# Patient Record
Sex: Female | Born: 1975 | Race: White | Hispanic: No | Marital: Single | Smoking: Former smoker
Health system: Southern US, Community
[De-identification: ages and names within clinical notes are randomized; demographics above are authoritative.]

## PROBLEM LIST (undated history)

## (undated) ENCOUNTER — Inpatient Hospital Stay (HOSPITAL_COMMUNITY): Payer: Self-pay

## (undated) DIAGNOSIS — F329 Major depressive disorder, single episode, unspecified: Secondary | ICD-10-CM

## (undated) DIAGNOSIS — Z8489 Family history of other specified conditions: Secondary | ICD-10-CM

## (undated) DIAGNOSIS — O99213 Obesity complicating pregnancy, third trimester: Secondary | ICD-10-CM

## (undated) DIAGNOSIS — T8859XA Other complications of anesthesia, initial encounter: Secondary | ICD-10-CM

## (undated) DIAGNOSIS — G4700 Insomnia, unspecified: Secondary | ICD-10-CM

## (undated) DIAGNOSIS — O99343 Other mental disorders complicating pregnancy, third trimester: Secondary | ICD-10-CM

## (undated) DIAGNOSIS — J40 Bronchitis, not specified as acute or chronic: Secondary | ICD-10-CM

## (undated) DIAGNOSIS — I10 Essential (primary) hypertension: Secondary | ICD-10-CM

## (undated) DIAGNOSIS — N200 Calculus of kidney: Secondary | ICD-10-CM

## (undated) DIAGNOSIS — O09529 Supervision of elderly multigravida, unspecified trimester: Secondary | ICD-10-CM

## (undated) DIAGNOSIS — O24419 Gestational diabetes mellitus in pregnancy, unspecified control: Secondary | ICD-10-CM

## (undated) DIAGNOSIS — F419 Anxiety disorder, unspecified: Secondary | ICD-10-CM

## (undated) DIAGNOSIS — E669 Obesity, unspecified: Secondary | ICD-10-CM

## (undated) DIAGNOSIS — K5909 Other constipation: Secondary | ICD-10-CM

## (undated) DIAGNOSIS — Z9889 Other specified postprocedural states: Secondary | ICD-10-CM

## (undated) DIAGNOSIS — G43909 Migraine, unspecified, not intractable, without status migrainosus: Secondary | ICD-10-CM

## (undated) DIAGNOSIS — F410 Panic disorder [episodic paroxysmal anxiety] without agoraphobia: Secondary | ICD-10-CM

## (undated) DIAGNOSIS — R350 Frequency of micturition: Secondary | ICD-10-CM

## (undated) DIAGNOSIS — G5603 Carpal tunnel syndrome, bilateral upper limbs: Secondary | ICD-10-CM

## (undated) DIAGNOSIS — F99 Mental disorder, not otherwise specified: Secondary | ICD-10-CM

## (undated) DIAGNOSIS — F32A Depression, unspecified: Secondary | ICD-10-CM

## (undated) DIAGNOSIS — B029 Zoster without complications: Secondary | ICD-10-CM

## (undated) DIAGNOSIS — R112 Nausea with vomiting, unspecified: Secondary | ICD-10-CM

## (undated) DIAGNOSIS — F430 Acute stress reaction: Secondary | ICD-10-CM

## (undated) DIAGNOSIS — R0602 Shortness of breath: Secondary | ICD-10-CM

## (undated) DIAGNOSIS — J302 Other seasonal allergic rhinitis: Secondary | ICD-10-CM

## (undated) DIAGNOSIS — Z98891 History of uterine scar from previous surgery: Secondary | ICD-10-CM

## (undated) DIAGNOSIS — K219 Gastro-esophageal reflux disease without esophagitis: Secondary | ICD-10-CM

## (undated) DIAGNOSIS — C439 Malignant melanoma of skin, unspecified: Secondary | ICD-10-CM

## (undated) DIAGNOSIS — T4145XA Adverse effect of unspecified anesthetic, initial encounter: Secondary | ICD-10-CM

## (undated) DIAGNOSIS — O133 Gestational [pregnancy-induced] hypertension without significant proteinuria, third trimester: Secondary | ICD-10-CM

## (undated) HISTORY — DX: Gestational diabetes mellitus in pregnancy, unspecified control: O24.419

## (undated) HISTORY — DX: Supervision of elderly multigravida, unspecified trimester: O09.529

## (undated) HISTORY — PX: CHOLECYSTECTOMY: SHX55

## (undated) HISTORY — PX: WISDOM TOOTH EXTRACTION: SHX21

---

## 2003-08-06 DIAGNOSIS — G5600 Carpal tunnel syndrome, unspecified upper limb: Secondary | ICD-10-CM | POA: Insufficient documentation

## 2005-01-04 DIAGNOSIS — Z8742 Personal history of other diseases of the female genital tract: Secondary | ICD-10-CM

## 2005-08-05 DIAGNOSIS — F319 Bipolar disorder, unspecified: Secondary | ICD-10-CM | POA: Insufficient documentation

## 2007-08-24 ENCOUNTER — Emergency Department (HOSPITAL_COMMUNITY): Admission: EM | Admit: 2007-08-24 | Discharge: 2007-08-24 | Payer: Self-pay | Admitting: Family Medicine

## 2007-09-17 ENCOUNTER — Emergency Department (HOSPITAL_COMMUNITY): Admission: EM | Admit: 2007-09-17 | Discharge: 2007-09-17 | Payer: Self-pay | Admitting: Family Medicine

## 2007-10-17 ENCOUNTER — Emergency Department (HOSPITAL_COMMUNITY): Admission: EM | Admit: 2007-10-17 | Discharge: 2007-10-17 | Payer: Self-pay | Admitting: Emergency Medicine

## 2007-11-12 ENCOUNTER — Emergency Department (HOSPITAL_COMMUNITY): Admission: EM | Admit: 2007-11-12 | Discharge: 2007-11-12 | Payer: Self-pay | Admitting: Family Medicine

## 2008-02-11 ENCOUNTER — Encounter (INDEPENDENT_AMBULATORY_CARE_PROVIDER_SITE_OTHER): Payer: Self-pay | Admitting: Internal Medicine

## 2008-02-19 ENCOUNTER — Ambulatory Visit: Payer: Self-pay | Admitting: Internal Medicine

## 2008-02-19 DIAGNOSIS — N83209 Unspecified ovarian cyst, unspecified side: Secondary | ICD-10-CM | POA: Insufficient documentation

## 2008-02-19 DIAGNOSIS — G43909 Migraine, unspecified, not intractable, without status migrainosus: Secondary | ICD-10-CM | POA: Insufficient documentation

## 2008-02-19 DIAGNOSIS — K219 Gastro-esophageal reflux disease without esophagitis: Secondary | ICD-10-CM | POA: Insufficient documentation

## 2008-02-19 DIAGNOSIS — F429 Obsessive-compulsive disorder, unspecified: Secondary | ICD-10-CM | POA: Insufficient documentation

## 2008-02-19 DIAGNOSIS — Z8719 Personal history of other diseases of the digestive system: Secondary | ICD-10-CM | POA: Insufficient documentation

## 2008-02-19 DIAGNOSIS — K8020 Calculus of gallbladder without cholecystitis without obstruction: Secondary | ICD-10-CM | POA: Insufficient documentation

## 2008-02-23 ENCOUNTER — Encounter (INDEPENDENT_AMBULATORY_CARE_PROVIDER_SITE_OTHER): Payer: Self-pay | Admitting: Internal Medicine

## 2008-02-23 ENCOUNTER — Emergency Department (HOSPITAL_COMMUNITY): Admission: EM | Admit: 2008-02-23 | Discharge: 2008-02-23 | Payer: Self-pay | Admitting: Emergency Medicine

## 2008-02-29 ENCOUNTER — Telehealth (INDEPENDENT_AMBULATORY_CARE_PROVIDER_SITE_OTHER): Payer: Self-pay | Admitting: Internal Medicine

## 2008-03-08 ENCOUNTER — Telehealth (INDEPENDENT_AMBULATORY_CARE_PROVIDER_SITE_OTHER): Payer: Self-pay | Admitting: *Deleted

## 2008-03-12 ENCOUNTER — Emergency Department (HOSPITAL_COMMUNITY): Admission: EM | Admit: 2008-03-12 | Discharge: 2008-03-12 | Payer: Self-pay | Admitting: Emergency Medicine

## 2008-03-21 ENCOUNTER — Telehealth (INDEPENDENT_AMBULATORY_CARE_PROVIDER_SITE_OTHER): Payer: Self-pay | Admitting: Internal Medicine

## 2008-03-22 ENCOUNTER — Emergency Department (HOSPITAL_COMMUNITY): Admission: EM | Admit: 2008-03-22 | Discharge: 2008-03-22 | Payer: Self-pay | Admitting: Emergency Medicine

## 2008-03-29 ENCOUNTER — Encounter (INDEPENDENT_AMBULATORY_CARE_PROVIDER_SITE_OTHER): Payer: Self-pay | Admitting: Internal Medicine

## 2008-03-29 ENCOUNTER — Encounter: Admission: RE | Admit: 2008-03-29 | Discharge: 2008-03-29 | Payer: Self-pay | Admitting: Internal Medicine

## 2008-05-11 ENCOUNTER — Emergency Department (HOSPITAL_COMMUNITY): Admission: EM | Admit: 2008-05-11 | Discharge: 2008-05-11 | Payer: Self-pay | Admitting: Emergency Medicine

## 2008-05-11 ENCOUNTER — Encounter (INDEPENDENT_AMBULATORY_CARE_PROVIDER_SITE_OTHER): Payer: Self-pay | Admitting: Internal Medicine

## 2008-05-16 ENCOUNTER — Encounter: Admission: RE | Admit: 2008-05-16 | Discharge: 2008-05-16 | Payer: Self-pay | Admitting: Internal Medicine

## 2008-05-17 ENCOUNTER — Telehealth (INDEPENDENT_AMBULATORY_CARE_PROVIDER_SITE_OTHER): Payer: Self-pay | Admitting: Internal Medicine

## 2008-05-30 ENCOUNTER — Emergency Department (HOSPITAL_COMMUNITY): Admission: EM | Admit: 2008-05-30 | Discharge: 2008-05-30 | Payer: Self-pay | Admitting: Family Medicine

## 2008-06-07 ENCOUNTER — Ambulatory Visit: Payer: Self-pay | Admitting: Internal Medicine

## 2008-06-07 LAB — CONVERTED CEMR LAB: Beta hcg, urine, semiquantitative: NEGATIVE

## 2008-09-26 ENCOUNTER — Telehealth (INDEPENDENT_AMBULATORY_CARE_PROVIDER_SITE_OTHER): Payer: Self-pay | Admitting: Internal Medicine

## 2008-10-06 ENCOUNTER — Ambulatory Visit: Payer: Self-pay | Admitting: Internal Medicine

## 2008-10-06 DIAGNOSIS — M549 Dorsalgia, unspecified: Secondary | ICD-10-CM | POA: Insufficient documentation

## 2008-10-06 LAB — CONVERTED CEMR LAB: Beta hcg, urine, semiquantitative: NEGATIVE

## 2008-10-18 ENCOUNTER — Encounter (INDEPENDENT_AMBULATORY_CARE_PROVIDER_SITE_OTHER): Payer: Self-pay | Admitting: Internal Medicine

## 2008-10-19 ENCOUNTER — Encounter (INDEPENDENT_AMBULATORY_CARE_PROVIDER_SITE_OTHER): Payer: Self-pay | Admitting: Internal Medicine

## 2008-10-24 ENCOUNTER — Encounter: Admission: RE | Admit: 2008-10-24 | Discharge: 2008-10-24 | Payer: Self-pay | Admitting: Internal Medicine

## 2008-10-24 ENCOUNTER — Encounter (INDEPENDENT_AMBULATORY_CARE_PROVIDER_SITE_OTHER): Payer: Self-pay | Admitting: Internal Medicine

## 2008-10-25 ENCOUNTER — Encounter: Admission: RE | Admit: 2008-10-25 | Discharge: 2009-01-16 | Payer: Self-pay | Admitting: Internal Medicine

## 2008-10-28 ENCOUNTER — Emergency Department (HOSPITAL_COMMUNITY): Admission: EM | Admit: 2008-10-28 | Discharge: 2008-10-29 | Payer: Self-pay | Admitting: Emergency Medicine

## 2008-11-02 ENCOUNTER — Encounter (INDEPENDENT_AMBULATORY_CARE_PROVIDER_SITE_OTHER): Payer: Self-pay | Admitting: Internal Medicine

## 2008-11-15 ENCOUNTER — Emergency Department (HOSPITAL_COMMUNITY): Admission: EM | Admit: 2008-11-15 | Discharge: 2008-11-15 | Payer: Self-pay | Admitting: Emergency Medicine

## 2008-11-24 ENCOUNTER — Encounter (INDEPENDENT_AMBULATORY_CARE_PROVIDER_SITE_OTHER): Payer: Self-pay | Admitting: Internal Medicine

## 2008-12-12 ENCOUNTER — Encounter (INDEPENDENT_AMBULATORY_CARE_PROVIDER_SITE_OTHER): Payer: Self-pay | Admitting: Internal Medicine

## 2008-12-19 ENCOUNTER — Encounter (INDEPENDENT_AMBULATORY_CARE_PROVIDER_SITE_OTHER): Payer: Self-pay | Admitting: Surgery

## 2008-12-19 ENCOUNTER — Ambulatory Visit (HOSPITAL_COMMUNITY): Admission: RE | Admit: 2008-12-19 | Discharge: 2008-12-20 | Payer: Self-pay | Admitting: Surgery

## 2008-12-23 ENCOUNTER — Ambulatory Visit: Payer: Self-pay | Admitting: Internal Medicine

## 2008-12-23 ENCOUNTER — Telehealth (INDEPENDENT_AMBULATORY_CARE_PROVIDER_SITE_OTHER): Payer: Self-pay | Admitting: Internal Medicine

## 2008-12-23 DIAGNOSIS — J189 Pneumonia, unspecified organism: Secondary | ICD-10-CM

## 2009-01-05 ENCOUNTER — Telehealth (INDEPENDENT_AMBULATORY_CARE_PROVIDER_SITE_OTHER): Payer: Self-pay | Admitting: *Deleted

## 2009-01-05 ENCOUNTER — Encounter (INDEPENDENT_AMBULATORY_CARE_PROVIDER_SITE_OTHER): Payer: Self-pay | Admitting: Internal Medicine

## 2009-01-05 DIAGNOSIS — J45909 Unspecified asthma, uncomplicated: Secondary | ICD-10-CM | POA: Insufficient documentation

## 2009-01-05 DIAGNOSIS — D1800 Hemangioma unspecified site: Secondary | ICD-10-CM | POA: Insufficient documentation

## 2009-01-23 ENCOUNTER — Telehealth (INDEPENDENT_AMBULATORY_CARE_PROVIDER_SITE_OTHER): Payer: Self-pay | Admitting: Internal Medicine

## 2009-01-23 ENCOUNTER — Emergency Department (HOSPITAL_COMMUNITY): Admission: EM | Admit: 2009-01-23 | Discharge: 2009-01-24 | Payer: Self-pay | Admitting: Emergency Medicine

## 2009-01-28 ENCOUNTER — Emergency Department (HOSPITAL_COMMUNITY): Admission: EM | Admit: 2009-01-28 | Discharge: 2009-01-28 | Payer: Self-pay | Admitting: Emergency Medicine

## 2009-01-30 ENCOUNTER — Ambulatory Visit: Payer: Self-pay | Admitting: Nurse Practitioner

## 2009-01-30 DIAGNOSIS — J309 Allergic rhinitis, unspecified: Secondary | ICD-10-CM | POA: Insufficient documentation

## 2009-02-01 ENCOUNTER — Emergency Department (HOSPITAL_COMMUNITY): Admission: EM | Admit: 2009-02-01 | Discharge: 2009-02-01 | Payer: Self-pay | Admitting: Emergency Medicine

## 2009-03-15 ENCOUNTER — Encounter (INDEPENDENT_AMBULATORY_CARE_PROVIDER_SITE_OTHER): Payer: Self-pay | Admitting: Internal Medicine

## 2009-03-19 ENCOUNTER — Emergency Department (HOSPITAL_COMMUNITY): Admission: EM | Admit: 2009-03-19 | Discharge: 2009-03-19 | Payer: Self-pay | Admitting: Emergency Medicine

## 2009-03-30 ENCOUNTER — Encounter: Admission: RE | Admit: 2009-03-30 | Discharge: 2009-04-20 | Payer: Self-pay | Admitting: Internal Medicine

## 2009-04-14 ENCOUNTER — Encounter (INDEPENDENT_AMBULATORY_CARE_PROVIDER_SITE_OTHER): Payer: Self-pay | Admitting: Internal Medicine

## 2009-05-03 ENCOUNTER — Encounter (INDEPENDENT_AMBULATORY_CARE_PROVIDER_SITE_OTHER): Payer: Self-pay | Admitting: Internal Medicine

## 2009-05-08 ENCOUNTER — Ambulatory Visit: Payer: Self-pay | Admitting: Physician Assistant

## 2009-05-08 DIAGNOSIS — R319 Hematuria, unspecified: Secondary | ICD-10-CM

## 2009-05-08 LAB — CONVERTED CEMR LAB
Bilirubin Urine: NEGATIVE
Glucose, Urine, Semiquant: NEGATIVE
Ketones, urine, test strip: NEGATIVE
Nitrite: NEGATIVE
Specific Gravity, Urine: >=1.03
Urobilinogen, UA: 0.2
WBC Urine, dipstick: NEGATIVE
pH: 5.5

## 2009-05-09 ENCOUNTER — Encounter: Payer: Self-pay | Admitting: Physician Assistant

## 2009-05-12 ENCOUNTER — Encounter (INDEPENDENT_AMBULATORY_CARE_PROVIDER_SITE_OTHER): Payer: Self-pay | Admitting: Internal Medicine

## 2009-06-14 ENCOUNTER — Telehealth (INDEPENDENT_AMBULATORY_CARE_PROVIDER_SITE_OTHER): Payer: Self-pay | Admitting: Internal Medicine

## 2009-06-27 ENCOUNTER — Ambulatory Visit: Payer: Self-pay | Admitting: Internal Medicine

## 2009-06-27 LAB — CONVERTED CEMR LAB
Beta hcg, urine, semiquantitative: NEGATIVE
Bilirubin Urine: NEGATIVE
Chlamydia, DNA Probe: NEGATIVE
GC Probe Amp, Genital: NEGATIVE
Glucose, Urine, Semiquant: NEGATIVE
KOH Prep: NEGATIVE
Ketones, urine, test strip: NEGATIVE
Nitrite: NEGATIVE
Protein, U semiquant: NEGATIVE
Specific Gravity, Urine: 1.025
Urobilinogen, UA: 0.2
Whiff Test: POSITIVE
pH: 6

## 2009-06-28 ENCOUNTER — Encounter (INDEPENDENT_AMBULATORY_CARE_PROVIDER_SITE_OTHER): Payer: Self-pay | Admitting: Internal Medicine

## 2009-07-06 ENCOUNTER — Ambulatory Visit: Payer: Self-pay | Admitting: Family Medicine

## 2009-07-06 ENCOUNTER — Encounter (INDEPENDENT_AMBULATORY_CARE_PROVIDER_SITE_OTHER): Payer: Self-pay | Admitting: Internal Medicine

## 2009-07-06 DIAGNOSIS — J019 Acute sinusitis, unspecified: Secondary | ICD-10-CM

## 2009-07-10 ENCOUNTER — Telehealth (INDEPENDENT_AMBULATORY_CARE_PROVIDER_SITE_OTHER): Payer: Self-pay | Admitting: Family Medicine

## 2009-07-17 ENCOUNTER — Encounter (INDEPENDENT_AMBULATORY_CARE_PROVIDER_SITE_OTHER): Payer: Self-pay | Admitting: Internal Medicine

## 2009-07-20 ENCOUNTER — Encounter (INDEPENDENT_AMBULATORY_CARE_PROVIDER_SITE_OTHER): Payer: Self-pay | Admitting: Internal Medicine

## 2009-07-24 ENCOUNTER — Telehealth (INDEPENDENT_AMBULATORY_CARE_PROVIDER_SITE_OTHER): Payer: Self-pay | Admitting: Internal Medicine

## 2010-03-08 ENCOUNTER — Encounter: Admission: RE | Admit: 2010-03-08 | Discharge: 2010-03-08 | Payer: Self-pay | Admitting: Family Medicine

## 2010-08-13 ENCOUNTER — Emergency Department (HOSPITAL_COMMUNITY)
Admission: EM | Admit: 2010-08-13 | Discharge: 2010-08-13 | Payer: Self-pay | Source: Home / Self Care | Admitting: Emergency Medicine

## 2010-08-16 DIAGNOSIS — I10 Essential (primary) hypertension: Secondary | ICD-10-CM | POA: Insufficient documentation

## 2010-08-17 DIAGNOSIS — E559 Vitamin D deficiency, unspecified: Secondary | ICD-10-CM | POA: Insufficient documentation

## 2010-08-20 LAB — URINALYSIS, ROUTINE W REFLEX MICROSCOPIC
Bilirubin Urine: NEGATIVE
Hgb urine dipstick: NEGATIVE
Ketones, ur: NEGATIVE mg/dL
Nitrite: NEGATIVE
Protein, ur: NEGATIVE mg/dL
Specific Gravity, Urine: 1.021 (ref 1.005–1.030)
Urine Glucose, Fasting: NEGATIVE mg/dL
Urobilinogen, UA: 0.2 mg/dL (ref 0.0–1.0)
pH: 6.5 (ref 5.0–8.0)

## 2010-08-20 LAB — POCT I-STAT, CHEM 8
BUN: 14 mg/dL (ref 6–23)
Calcium, Ion: 1.18 mmol/L (ref 1.12–1.32)
Chloride: 103 mEq/L (ref 96–112)
Creatinine, Ser: 0.8 mg/dL (ref 0.4–1.2)
Glucose, Bld: 107 mg/dL — ABNORMAL HIGH (ref 70–99)
HCT: 42 % (ref 36.0–46.0)
Hemoglobin: 14.3 g/dL (ref 12.0–15.0)
Potassium: 4 mEq/L (ref 3.5–5.1)
Sodium: 139 mEq/L (ref 135–145)
TCO2: 28 mmol/L (ref 0–100)

## 2010-08-20 LAB — URINE MICROSCOPIC-ADD ON

## 2010-08-20 LAB — CBC
HCT: 41.5 % (ref 36.0–46.0)
Hemoglobin: 13.6 g/dL (ref 12.0–15.0)
MCH: 30 pg (ref 26.0–34.0)
MCHC: 32.8 g/dL (ref 30.0–36.0)
MCV: 91.4 fL (ref 78.0–100.0)
Platelets: 350 10*3/uL (ref 150–400)
RBC: 4.54 MIL/uL (ref 3.87–5.11)
RDW: 14 % (ref 11.5–15.5)
WBC: 9.8 10*3/uL (ref 4.0–10.5)

## 2010-08-20 LAB — DIFFERENTIAL
Basophils Absolute: 0 10*3/uL (ref 0.0–0.1)
Basophils Relative: 0 % (ref 0–1)
Eosinophils Absolute: 0.1 10*3/uL (ref 0.0–0.7)
Eosinophils Relative: 1 % (ref 0–5)
Lymphocytes Relative: 20 % (ref 12–46)
Lymphs Abs: 1.9 10*3/uL (ref 0.7–4.0)
Monocytes Absolute: 0.6 10*3/uL (ref 0.1–1.0)
Monocytes Relative: 6 % (ref 3–12)
Neutro Abs: 7.1 10*3/uL (ref 1.7–7.7)
Neutrophils Relative %: 73 % (ref 43–77)

## 2010-08-20 LAB — URINE CULTURE
Colony Count: NO GROWTH
Culture  Setup Time: 201201091605
Culture: NO GROWTH

## 2010-08-20 LAB — POCT PREGNANCY, URINE: Preg Test, Ur: NEGATIVE

## 2010-08-26 ENCOUNTER — Encounter: Payer: Self-pay | Admitting: Internal Medicine

## 2010-09-04 NOTE — Letter (Signed)
Summary: TEST ORDER FORM/ULTRASOUND//RENAL & PELVIC//APPT DATE & TIME  TEST ORDER FORM/ULTRASOUND//RENAL & PELVIC//APPT DATE & TIME   Imported By: Arta Bruce 08/29/2009 15:22:56  _____________________________________________________________________  External Attachment:    Type:   Image     Comment:   External Document

## 2010-09-04 NOTE — Letter (Signed)
Summary: TEST ORDER FORM//ULTRASOUND//HEMATURIA//APPT DATE & TIME  TEST ORDER FORM//ULTRASOUND//HEMATURIA//APPT DATE & TIME   Imported By: Arta Bruce 08/29/2009 15:44:13  _____________________________________________________________________  External Attachment:    Type:   Image     Comment:   External Document

## 2010-09-04 NOTE — Letter (Signed)
Summary: MED SOLUTIONS//APPROVED  MED SOLUTIONS//APPROVED   Imported By: Arta Bruce 09/05/2009 15:25:50  _____________________________________________________________________  External Attachment:    Type:   Image     Comment:   External Document

## 2010-09-04 NOTE — Letter (Signed)
Summary: TRIGE CALL REPORT  TRIGE CALL REPORT   Imported By: Arta Bruce 09/18/2009 14:16:54  _____________________________________________________________________  External Attachment:    Type:   Image     Comment:   External Document

## 2010-11-10 LAB — POCT RAPID STREP A (OFFICE): Streptococcus, Group A Screen (Direct): POSITIVE — AB

## 2010-11-13 LAB — HEMOGLOBIN AND HEMATOCRIT, BLOOD
HCT: 37.7 % (ref 36.0–46.0)
Hemoglobin: 12.6 g/dL (ref 12.0–15.0)

## 2010-11-13 LAB — PREGNANCY, URINE: Preg Test, Ur: NEGATIVE

## 2010-11-14 LAB — URINE MICROSCOPIC-ADD ON

## 2010-11-14 LAB — DIFFERENTIAL
Basophils Relative: 0 % (ref 0–1)
Eosinophils Absolute: 0.1 10*3/uL (ref 0.0–0.7)
Eosinophils Relative: 0 % (ref 0–5)
Lymphs Abs: 1.6 10*3/uL (ref 0.7–4.0)
Monocytes Absolute: 0.7 10*3/uL (ref 0.1–1.0)
Monocytes Relative: 5 % (ref 3–12)
Neutrophils Relative %: 81 % — ABNORMAL HIGH (ref 43–77)

## 2010-11-14 LAB — CBC
Hemoglobin: 12.7 g/dL (ref 12.0–15.0)
MCHC: 34.4 g/dL (ref 30.0–36.0)
RBC: 4.18 MIL/uL (ref 3.87–5.11)
RDW: 14.3 % (ref 11.5–15.5)

## 2010-11-14 LAB — COMPREHENSIVE METABOLIC PANEL
ALT: 14 U/L (ref 0–35)
AST: 21 U/L (ref 0–37)
Alkaline Phosphatase: 53 U/L (ref 39–117)
Calcium: 8.6 mg/dL (ref 8.4–10.5)
GFR calc Af Amer: >60 mL/min (ref >60)
Glucose, Bld: 127 mg/dL — ABNORMAL HIGH (ref 70–99)
Potassium: 4.1 mEq/L (ref 3.5–5.1)
Sodium: 135 mEq/L (ref 135–145)
Total Protein: 6.7 g/dL (ref 6.0–8.3)

## 2010-11-14 LAB — URINALYSIS, ROUTINE W REFLEX MICROSCOPIC
Glucose, UA: NEGATIVE mg/dL
Ketones, ur: NEGATIVE mg/dL
Protein, ur: NEGATIVE mg/dL
pH: 5.5 (ref 5.0–8.0)

## 2010-11-14 LAB — POCT PREGNANCY, URINE: Preg Test, Ur: NEGATIVE

## 2010-11-15 LAB — CBC
HCT: 39.6 % (ref 36.0–46.0)
MCHC: 35 g/dL (ref 30.0–36.0)
MCV: 87.5 fL (ref 78.0–100.0)
Platelets: 284 10*3/uL (ref 150–400)
RDW: 14 % (ref 11.5–15.5)

## 2010-11-15 LAB — COMPREHENSIVE METABOLIC PANEL
AST: 27 U/L (ref 0–37)
Albumin: 3.9 g/dL (ref 3.5–5.2)
BUN: 14 mg/dL (ref 6–23)
Calcium: 8.9 mg/dL (ref 8.4–10.5)
Chloride: 103 mEq/L (ref 96–112)
Creatinine, Ser: 0.67 mg/dL (ref 0.4–1.2)
GFR calc Af Amer: >60 mL/min (ref >60)
Total Bilirubin: 0.7 mg/dL (ref 0.3–1.2)
Total Protein: 6.9 g/dL (ref 6.0–8.3)

## 2010-11-15 LAB — POCT I-STAT 3, ART BLOOD GAS (G3+)
Bicarbonate: 22.2 mEq/L (ref 20.0–24.0)
O2 Saturation: 97 %
pCO2 arterial: 40.9 mmHg (ref 35.0–45.0)
pO2, Arterial: 101 mmHg — ABNORMAL HIGH (ref 80.0–100.0)

## 2010-11-15 LAB — DIFFERENTIAL
Basophils Absolute: 0 10*3/uL (ref 0.0–0.1)
Lymphocytes Relative: 7 % — ABNORMAL LOW (ref 12–46)
Lymphs Abs: 1.1 10*3/uL (ref 0.7–4.0)
Monocytes Absolute: 0.8 10*3/uL (ref 0.1–1.0)
Neutro Abs: 13.4 10*3/uL — ABNORMAL HIGH (ref 1.7–7.7)

## 2010-12-07 DIAGNOSIS — G4700 Insomnia, unspecified: Secondary | ICD-10-CM | POA: Insufficient documentation

## 2010-12-18 NOTE — Op Note (Signed)
Grace Fisher, Grace Fisher             ACCOUNT NO.:  [id number]   MEDICAL RECORD NO.:  [id number]          PATIENT TYPE:  OIB   LOCATION:  0098                         FACILITY:  [facility]   PHYSICIAN:  Grace Sportsman, MD     DATE OF BIRTH:  10-15-1975   DATE OF PROCEDURE:  12/19/2008  DATE OF DISCHARGE:                               OPERATIVE REPORT   PRIMARY CARE PHYSICIAN:  Grace Manson, MD.   EMERGENCY ROOM PHYSICIAN:  Grace Buckler. Caporossi, MD.   PREOPERATIVE DIAGNOSES:  1. Symptomatic cholecystolithiasis, probable chronic cholecystitis.  2. Fatty steatohepatosis.   POSTOPERATIVE DIAGNOSES:  1. Symptomatic cholecystolithiasis, probable chronic cholecystitis.  2. Fatty steatohepatosis.  3. Morbid obesity.   SURGEON:  Grace Sportsman, MD.   FIRST ASSISTANT:  RN.   ANESTHESIA:  1. General anesthesia.  2. Local anesthetic in a field block around port sites.   SPECIMENS:  Gallbladder.   DRAINS:  None.   ESTIMATED BLOOD LOSS:  30 mL.   COMPLICATIONS:  None apparent.   INDICATIONS:  Grace Fisher is a 35 year old, morbidly obese female with  a BMI of 54.1.  She has had worsening symptoms of biliary colic with  milder episodes in the past and now much more intense and came to the  emergency room because of this.  Workup is concerning for symptomatic  gallstones.  Her reflux issues are well-controlled and chronic and this  was nothing like that.   Anatomy and physiology of hepatobiliary and pancreatic function were  discussed.  Pathophysiology of symptomatic cholecystolithiasis with a  history of chronic cholecystitis and other natural risks were discussed.  After discussion, recommendations were made for a laparoscopic  cholecystectomy with intraoperative cholangiogram.  Risks, benefits, and  alternatives were discussed.  Questions answered and agreed to proceed.   OPERATIVE FINDINGS:  She had a mild gallbladder wall thickening with a  few omental and epiploic  appendage adhesions to the gallbladder  concerning for chronic cholecystitis.  She had numerous stones varying  from 1 mm to 15 mm in size.  She had a very large fatty liver.  The gallbladder was very  intrahepatic.   DESCRIPTION OF PROCEDURE:  Informed consent was confirmed.  The patient  underwent general anesthesia without any difficulty.  She had voided  just prior to going into the operating room.  She had sequential  pressure devices activated during the entire case.  She was supine with  both arms tucked.  Her abdomen was prepped and draped in a sterile  fashion.  A surgical timeout confirmed our plan.   A #5-mm port was placed in the right upper quadrant using optical entry  technique with the patient in steep reverse Trendelenburg and right side  up.  Camera inspection revealed no intraabdominal injury.  Under direct  visualization, 5-mm ports were placed in the right flank and through the  superior part of the umbilicus.  A 10-mm port was tunneled through the  falciform ligament in the subxiphoid region.   The liver was elevated cephalad.  With that, I eventually defined a very  intrahepatic gallbladder of moderate size.  With some difficulty, we  were able to elevate the gallbladder more cephalad.  Fortunately, her  hepatic lobe was rather mobile.  Eventually, we were able to elevate the  hepatic lobe above her subcostal ridge at least partially up there to  help better expose the middle part of the gallbladder and infundibulum.  Moderate adhesions were noted to the gallbladder fundus.  They were  carefully isolated and controlled with a focused dissection and hook  cautery.  The peritoneal coverings between the anteromedial wall of the  gallbladder and posterolateral wall of the gallbladder to the hepatic  fossa were freed off.  During dissection, there was a tear in the  infundibulum and there was spillage with some very thick molasses-type  bile and a few small stones.   A clamp was placed across this and this  was controlled.   Careful dissection was done to free the proximal third of the  gallbladder off of the liver bed and do a good  meticulous dissection on  the gallbladder for a good critical view.  This left 2 structures, one  going down from the gallbladder down to the porta hepatis.  The wound  was pulsatile consistent with the cystic artery.  One clip on the  gallbladder side and 2 clips slightly proximal were made, and this was  transected.   This left one structure on the gallbladder going from the infundibulum  down to the porta hepatis consistent with the cystic duct.  The  infundibulum had 2 clips placed following up and a partial cyst  ductotomy was performed, which in doing this the gallbladder avulsed off  the small cystic duct.  This duct stump was grasped and elevated.  A 5-  [place] cholangiocatheter was placed through a right subcostal puncture  site and flushed and passed easily in the cystic duct stump.  Cholangiogram was run using dilute radiopaque contrast and continuous  fluoroscopy.  Contrast flowed from a helical side branch consistent with  the cystic duct cannulization.  Contrast refluxed up the common hepatic  up into the right and left intrahepatic chains.  It flowed down the  common bile duct across a normal ampulla without any stricturing into  the duodenum.  We did this without any difficulty.  This was consistent  with a normal cholangiogram.   Three clips were placed on the cystic duct.  A 0 PDS Endoloop was placed  around the cyst duct stump.  One of the clips fell off so I put one back  on.  The gallbladder was freed from its remaining attachments on the  liver bed and placed inside an EndoCatch bag and removed out the  subxiphoid port with some moderate dilation needed.  In pulling it out,  the bag ruptured and there was spillage still in the subxiphoid wound.  Eventually, I cleared all that out with copious  irrigation in the wound  itself.  A 0 Vicryl stitch was placed around the fascial defects since  it could barely allow my pinky to pass and I wanted to be sure to avoid  any risk of hernia in this woman.   Meticulous hemostasis was assured in the liver bed.  The liver was very  friable and fragile, but ultimately I was able to get good control with  the hook cautery.  Meticulous inspection was made in the cystic duct,  and the cystic duct and arterial stumps were intact.  Over 2 L of  irrigation were done with clear  return.  Capnoperitoneum was activated  and ports removed.  The fascial stitch was tied down.  Another 500 mL of  copious irrigation was done in the subxiphoid port.  The skin was closed  using a 4-0 Vicryl stitch.  Sterile dressings applied.  The patient was  extubated and sent to the recovery room in stable condition.   I had discussed postop care with the patient in detail in the office and  then just prior to surgery, and we will discuss with her fiance' per her  request as well.      Grace Sportsman, MD  Electronically Signed     SCG/MEDQ  D:  12/19/2008  T:  12/19/2008  Job:  [id number]   cc:   Marcene Duos, M.D.   Jeffrey P. Weldon Inches, MD  Fax: [phone]

## 2011-02-17 ENCOUNTER — Inpatient Hospital Stay (INDEPENDENT_AMBULATORY_CARE_PROVIDER_SITE_OTHER)
Admission: RE | Admit: 2011-02-17 | Discharge: 2011-02-17 | Disposition: A | Discharge status: Home / Self Care | Payer: Medicaid Other | Source: Ambulatory Visit | Attending: Family Medicine | Admitting: Family Medicine

## 2011-02-17 DIAGNOSIS — N390 Urinary tract infection, site not specified: Secondary | ICD-10-CM

## 2011-02-17 LAB — POCT URINALYSIS DIP (DEVICE)
Glucose, UA: NEGATIVE mg/dL
Ketones, ur: NEGATIVE mg/dL
Specific Gravity, Urine: >=1.03 (ref 1.005–1.030)

## 2011-02-17 LAB — POCT PREGNANCY, URINE: Preg Test, Ur: NEGATIVE

## 2011-02-18 LAB — URINE CULTURE
Colony Count: NO GROWTH
Culture  Setup Time: 201207152359

## 2011-03-18 ENCOUNTER — Emergency Department (HOSPITAL_COMMUNITY): Payer: Medicaid Other

## 2011-03-18 ENCOUNTER — Inpatient Hospital Stay (INDEPENDENT_AMBULATORY_CARE_PROVIDER_SITE_OTHER)
Admission: RE | Admit: 2011-03-18 | Discharge: 2011-03-18 | Disposition: A | Discharge status: Home / Self Care | Payer: Medicaid Other | Source: Ambulatory Visit | Attending: Emergency Medicine | Admitting: Emergency Medicine

## 2011-03-18 ENCOUNTER — Emergency Department (HOSPITAL_COMMUNITY)
Admission: EM | Admit: 2011-03-18 | Discharge: 2011-03-19 | Disposition: A | Discharge status: Home / Self Care | Payer: Medicaid Other | Attending: Emergency Medicine | Admitting: Emergency Medicine

## 2011-03-18 DIAGNOSIS — K219 Gastro-esophageal reflux disease without esophagitis: Secondary | ICD-10-CM | POA: Insufficient documentation

## 2011-03-18 DIAGNOSIS — R5381 Other malaise: Secondary | ICD-10-CM | POA: Insufficient documentation

## 2011-03-18 DIAGNOSIS — F341 Dysthymic disorder: Secondary | ICD-10-CM | POA: Insufficient documentation

## 2011-03-18 DIAGNOSIS — IMO0002 Reserved for concepts with insufficient information to code with codable children: Secondary | ICD-10-CM | POA: Insufficient documentation

## 2011-03-18 DIAGNOSIS — F319 Bipolar disorder, unspecified: Secondary | ICD-10-CM | POA: Insufficient documentation

## 2011-03-18 DIAGNOSIS — R51 Headache: Secondary | ICD-10-CM | POA: Insufficient documentation

## 2011-03-18 DIAGNOSIS — S0990XA Unspecified injury of head, initial encounter: Secondary | ICD-10-CM

## 2011-03-18 DIAGNOSIS — R630 Anorexia: Secondary | ICD-10-CM | POA: Insufficient documentation

## 2011-03-18 DIAGNOSIS — M542 Cervicalgia: Secondary | ICD-10-CM | POA: Insufficient documentation

## 2011-03-18 DIAGNOSIS — R55 Syncope and collapse: Secondary | ICD-10-CM | POA: Insufficient documentation

## 2011-03-18 DIAGNOSIS — Z79899 Other long term (current) drug therapy: Secondary | ICD-10-CM | POA: Insufficient documentation

## 2011-03-18 DIAGNOSIS — Y92009 Unspecified place in unspecified non-institutional (private) residence as the place of occurrence of the external cause: Secondary | ICD-10-CM | POA: Insufficient documentation

## 2011-03-18 DIAGNOSIS — R42 Dizziness and giddiness: Secondary | ICD-10-CM | POA: Insufficient documentation

## 2011-04-29 LAB — POCT URINALYSIS DIP (DEVICE)
Bilirubin Urine: NEGATIVE
Glucose, UA: NEGATIVE
Hgb urine dipstick: NEGATIVE
Ketones, ur: NEGATIVE
Specific Gravity, Urine: 1.015

## 2011-04-29 LAB — GC/CHLAMYDIA PROBE AMP, GENITAL
Chlamydia, DNA Probe: NEGATIVE
GC Probe Amp, Genital: NEGATIVE

## 2011-04-29 LAB — WET PREP, GENITAL: Trich, Wet Prep: NONE SEEN

## 2011-05-05 ENCOUNTER — Emergency Department (HOSPITAL_COMMUNITY)
Admission: EM | Admit: 2011-05-05 | Discharge: 2011-05-05 | Disposition: A | Discharge status: Home / Self Care | Payer: Medicaid Other | Attending: Emergency Medicine | Admitting: Emergency Medicine

## 2011-05-05 ENCOUNTER — Emergency Department (HOSPITAL_COMMUNITY): Payer: Medicaid Other

## 2011-05-05 DIAGNOSIS — R05 Cough: Secondary | ICD-10-CM | POA: Insufficient documentation

## 2011-05-05 DIAGNOSIS — R5381 Other malaise: Secondary | ICD-10-CM | POA: Insufficient documentation

## 2011-05-05 DIAGNOSIS — F341 Dysthymic disorder: Secondary | ICD-10-CM | POA: Insufficient documentation

## 2011-05-05 DIAGNOSIS — K219 Gastro-esophageal reflux disease without esophagitis: Secondary | ICD-10-CM | POA: Insufficient documentation

## 2011-05-05 DIAGNOSIS — J3489 Other specified disorders of nose and nasal sinuses: Secondary | ICD-10-CM | POA: Insufficient documentation

## 2011-05-05 DIAGNOSIS — J069 Acute upper respiratory infection, unspecified: Secondary | ICD-10-CM | POA: Insufficient documentation

## 2011-05-05 DIAGNOSIS — R070 Pain in throat: Secondary | ICD-10-CM | POA: Insufficient documentation

## 2011-05-05 DIAGNOSIS — I10 Essential (primary) hypertension: Secondary | ICD-10-CM | POA: Insufficient documentation

## 2011-05-05 DIAGNOSIS — R059 Cough, unspecified: Secondary | ICD-10-CM | POA: Insufficient documentation

## 2011-05-05 DIAGNOSIS — R062 Wheezing: Secondary | ICD-10-CM | POA: Insufficient documentation

## 2011-05-05 DIAGNOSIS — R0982 Postnasal drip: Secondary | ICD-10-CM | POA: Insufficient documentation

## 2011-05-05 DIAGNOSIS — Z79899 Other long term (current) drug therapy: Secondary | ICD-10-CM | POA: Insufficient documentation

## 2011-05-05 DIAGNOSIS — J40 Bronchitis, not specified as acute or chronic: Secondary | ICD-10-CM | POA: Insufficient documentation

## 2011-05-05 DIAGNOSIS — F319 Bipolar disorder, unspecified: Secondary | ICD-10-CM | POA: Insufficient documentation

## 2011-08-13 ENCOUNTER — Emergency Department (INDEPENDENT_AMBULATORY_CARE_PROVIDER_SITE_OTHER)
Admission: EM | Admit: 2011-08-13 | Discharge: 2011-08-13 | Disposition: A | Discharge status: Home / Self Care | Payer: Medicaid Other | Source: Home / Self Care | Attending: Emergency Medicine | Admitting: Emergency Medicine

## 2011-08-13 ENCOUNTER — Emergency Department (INDEPENDENT_AMBULATORY_CARE_PROVIDER_SITE_OTHER): Payer: Medicaid Other

## 2011-08-13 ENCOUNTER — Encounter: Payer: Self-pay | Admitting: *Deleted

## 2011-08-13 DIAGNOSIS — J45909 Unspecified asthma, uncomplicated: Secondary | ICD-10-CM

## 2011-08-13 MED ORDER — FEXOFENADINE-PSEUDOEPHED ER 60-120 MG PO TB12: 1.0000 | ORAL_TABLET | Freq: Two times a day (BID) | ORAL | Status: DC

## 2011-08-13 MED ORDER — HYDROCODONE-ACETAMINOPHEN 7.5-500 MG/15ML PO SOLN: 15.0000 mL | Freq: Three times a day (TID) | ORAL | Status: AC | PRN

## 2011-08-13 MED ORDER — PREDNISONE 20 MG PO TABS: ORAL_TABLET | ORAL | Status: AC

## 2011-08-13 MED ORDER — IPRATROPIUM BROMIDE 0.02 % IN SOLN
0.5000 mg | Freq: Once | RESPIRATORY_TRACT | Status: AC
  Administered 2011-08-13: 0.5 mg via RESPIRATORY_TRACT

## 2011-08-13 NOTE — ED Provider Notes (Signed)
History     CSN: [id number]  Arrival date & time 08/13/11  1425   First MD Initiated Contact with Patient 08/13/11 1534      Chief Complaint  Patient presents with  . Cough    (Consider location/radiation/quality/duration/timing/severity/associated sxs/prior treatment) HPI Comments: 36 y/o Obese non smoker female h/o asthma like episodes in the past here c/o wheezing and shortness of breath. Had used her albuterol inhaler at home once today with some relief but persistent symptoms. State she has had cough and congestion for about 1 week. Had sore thorat that is now resolved and unsure if fever on first few days, also has bilateral ear pressure; was seen at her PCP office 5 days ago and was started on a 10 day cycle of an antibiotic today is day 5/10 denies fever but has had persistent cough with clear phlegm that cause pain in her upper abdomen and lower back. No nausea vomiting or diarrhea.   Past Medical History  Diagnosis Date  . Asthma     Past Surgical History  Procedure Date  . Cholecystectomy     Family History  Problem Relation Age of Onset  . Coronary artery disease Father     History  Substance Use Topics  . Smoking status: Never Smoker   . Smokeless tobacco: Not on file  . Alcohol Use: No    OB History    Grav Para Term Preterm Abortions TAB SAB Ect Mult Living                  Review of Systems  Constitutional: Negative for fever, chills and appetite change.  HENT: Positive for ear pain, congestion and rhinorrhea. Negative for sore throat, trouble swallowing, neck stiffness and voice change.   Eyes: Negative for discharge.  Respiratory: Positive for cough, chest tightness, shortness of breath and wheezing.   Cardiovascular: Negative for chest pain, palpitations and leg swelling.  Musculoskeletal: Positive for back pain.  Neurological: Negative for dizziness and headaches.    Allergies  Morphine; Penicillins; Sulfamethoxazole w/trimethoprim; and  Tylenol-codeine  Home Medications   Current Outpatient Rx  Name Route Sig Dispense Refill  . ALBUTEROL SULFATE HFA 108 (90 BASE) MCG/ACT IN AERS Inhalation Inhale 2 puffs into the lungs every 6 (six) hours as needed.      Marland Kitchen MONTELUKAST SODIUM 10 MG PO TABS Oral Take 10 mg by mouth at bedtime.      . TRAZODONE HCL 100 MG PO TABS Oral Take 100 mg by mouth at bedtime.      Marland Kitchen FEXOFENADINE-PSEUDOEPHED ER 60-120 MG PO TB12 Oral Take 1 tablet by mouth every 12 (twelve) hours. 30 tablet 0  . HYDROCODONE-ACETAMINOPHEN 7.5-500 MG/15ML PO SOLN Oral Take 15 mLs by mouth every 8 (eight) hours as needed for pain or cough. 120 mL 0  . PREDNISONE 20 MG PO TABS  2 tabs po daily for 5 days 10 tablet no    BP 151/79  Pulse 85  Temp(Src) 99.1 F (37.3 C) (Oral)  Resp 26  SpO2 97%  LMP 08/06/2011  Physical Exam  Nursing note and vitals reviewed. Constitutional: She is oriented to person, place, and time. She appears well-developed and well-nourished. No distress.  HENT:  Head: Normocephalic and atraumatic.  Right Ear: External ear normal.  Left Ear: External ear normal.  Nose: Nose normal.  Mouth/Throat: Oropharynx is clear and moist. No oropharyngeal exudate.       TMs with clear fluid air level (bubbles) behind no redness swelling  or bulging.  Eyes: Conjunctivae and EOM are normal. Pupils are equal, round, and reactive to light.  Neck: No JVD present.  Cardiovascular: Normal rate, regular rhythm and normal heart sounds.   Pulmonary/Chest:       Lung exam after ipratropium nebulization improved bilateral mild wheezing and rhonchi, no crackles, no tachypnea no orthopnea no retraction speaking in full sentences.  Abdominal: Soft. There is no tenderness.  Lymphadenopathy:    She has no cervical adenopathy.  Neurological: She is alert and oriented to person, place, and time.  Skin: Skin is warm. No rash noted.    ED Course  Procedures (including critical care time)  Labs Reviewed - No data to  display Dg Chest 2 View  08/13/2011  *RADIOLOGY REPORT*  Clinical Data: Cough, fever  CHEST - 2 VIEW  Comparison: 05/05/2011  Findings: Lungs are clear. No pleural effusion or pneumothorax.  Cardiomediastinal silhouette is within normal limits.  Mild degenerative changes of the visualized thoracolumbar spine.  IMPRESSION: No evidence of acute cardiopulmonary disease.  Original Report Authenticated By: Charline Bills, M.D.     1. Asthmatic bronchitis       MDM  Complete antibiotic cycle. Start prednisone, continue bronchodilator inhaler, decongestant and cough suppressant prescribed.        Sharin Grave, MD 08/14/11 1322

## 2011-08-13 NOTE — ED Notes (Signed)
Pt  Reports  Symptoms  Of  Cough      Back pain  And  Chest  Pain  Symptoms      Started  1  Week    Ago          Tried  Some  Albuterol  From previous    Encounter

## 2011-09-10 DIAGNOSIS — L309 Dermatitis, unspecified: Secondary | ICD-10-CM | POA: Insufficient documentation

## 2011-10-13 IMAGING — CR DG CHEST 2V
2 series · 2 of 2 positions shown · non-contrast
Comparison: Chest x-ray 03/12/2008.

CLINICAL DATA: Shortness of breath and cough.

CHEST - 2 VIEW

[w chest pa]
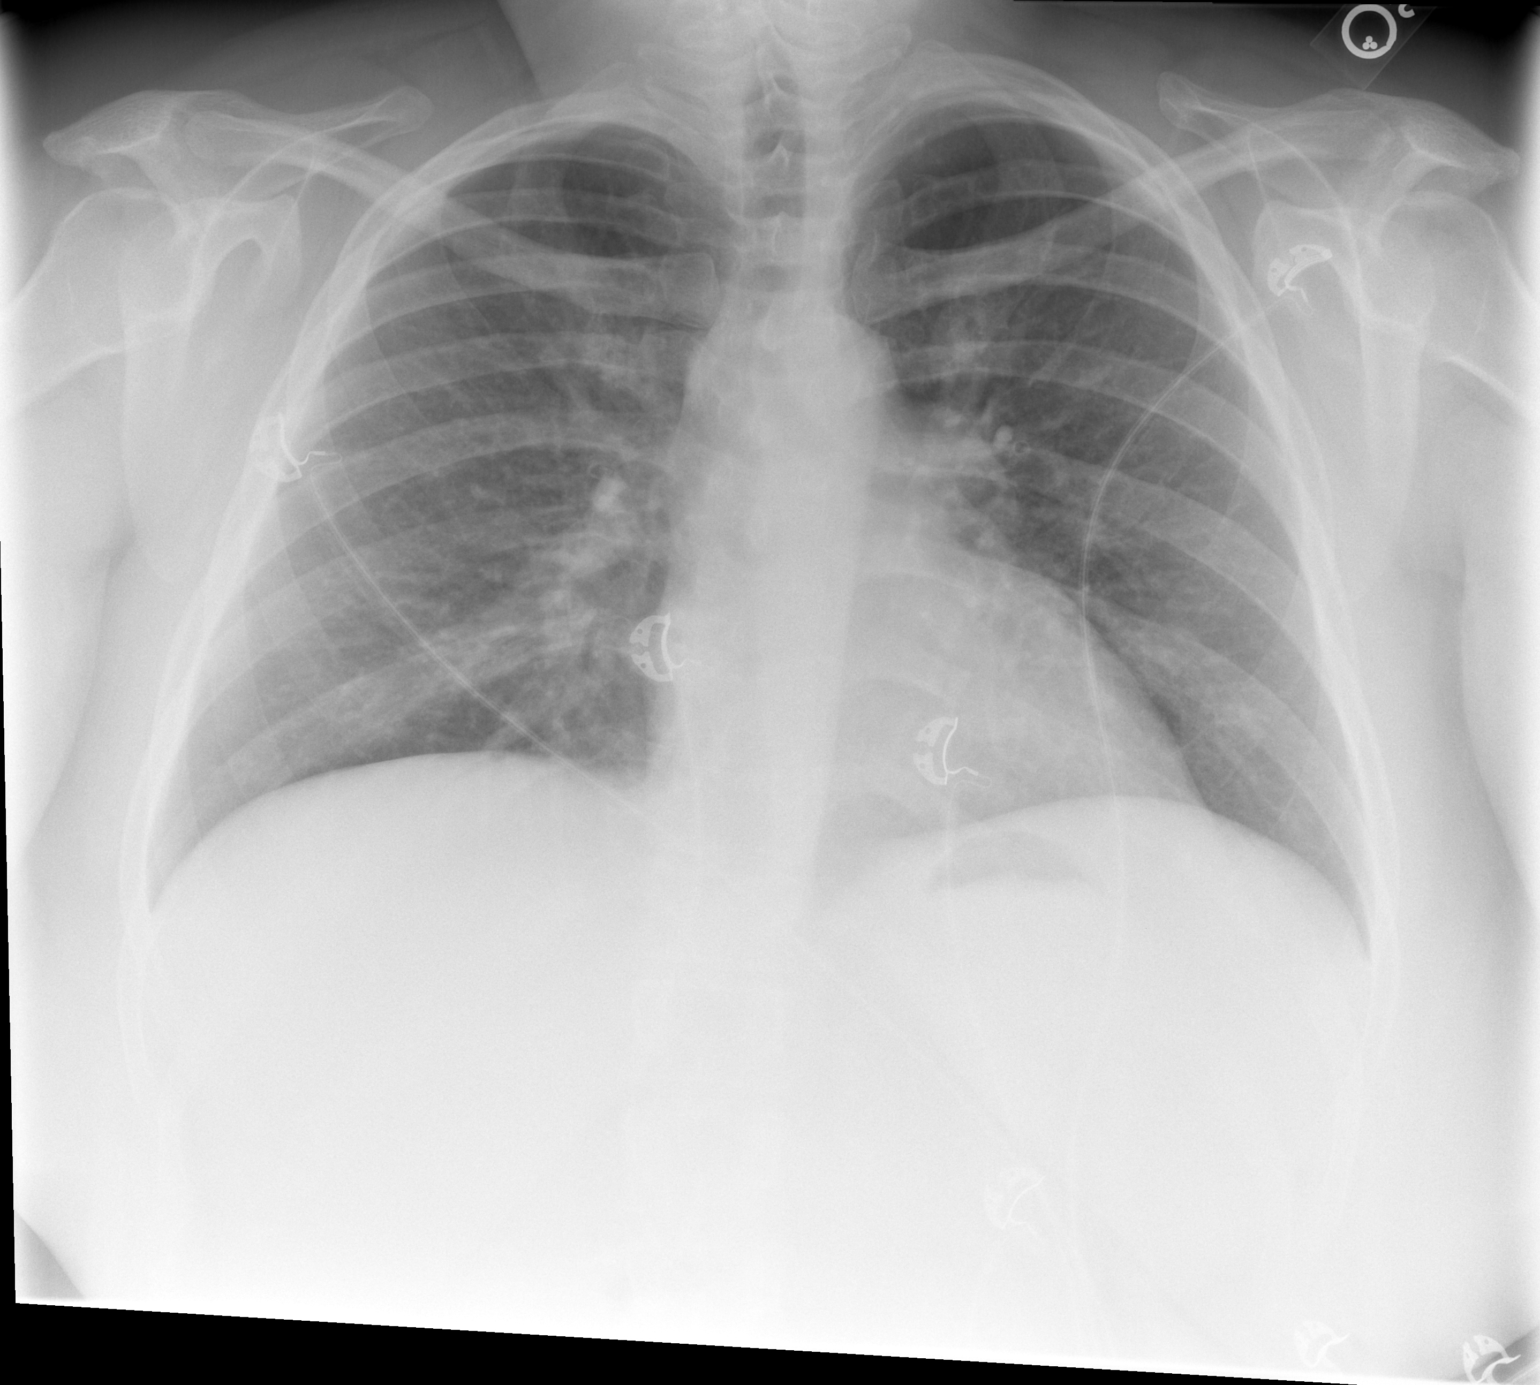

[w chest lat]
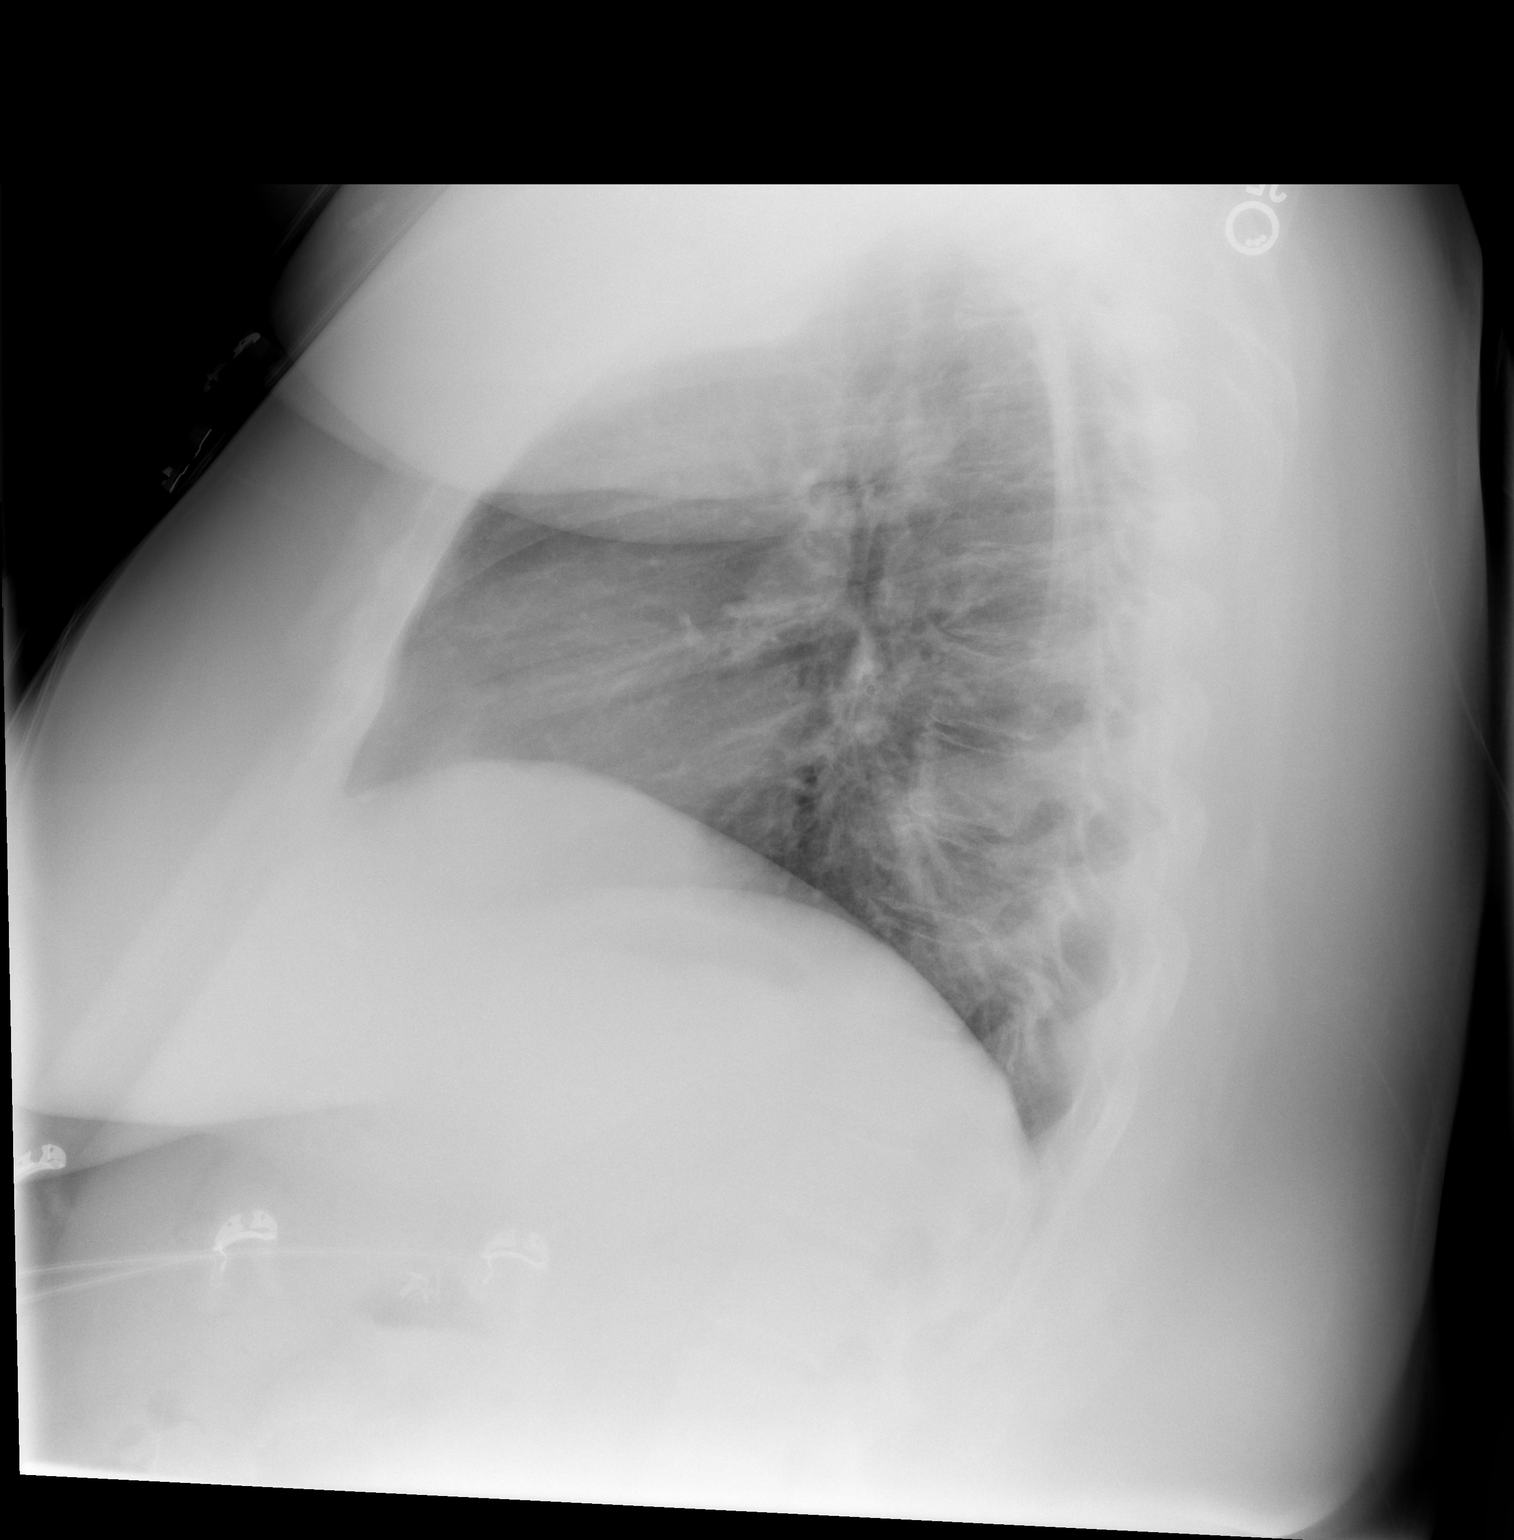

[2 of 2 positions shown; findings below may reference images not displayed]

FINDINGS: The cardiac silhouette, mediastinal and hilar contours
are within normal limits and stable.  Slightly low lung volumes
with vascular crowding and basilar atelectasis.  No infiltrates or
effusions.
IMPRESSION: Low lung volumes with mild vascular crowding and streaky basilar
atelectasis.

## 2011-10-27 ENCOUNTER — Emergency Department (HOSPITAL_COMMUNITY)
Admission: EM | Admit: 2011-10-27 | Discharge: 2011-10-27 | Disposition: A | Discharge status: Home / Self Care | Payer: Medicaid Other | Attending: Emergency Medicine | Admitting: Emergency Medicine

## 2011-10-27 ENCOUNTER — Encounter (HOSPITAL_COMMUNITY): Payer: Self-pay | Admitting: *Deleted

## 2011-10-27 ENCOUNTER — Emergency Department (HOSPITAL_COMMUNITY): Payer: Medicaid Other

## 2011-10-27 DIAGNOSIS — R109 Unspecified abdominal pain: Secondary | ICD-10-CM | POA: Insufficient documentation

## 2011-10-27 DIAGNOSIS — R000 Tachycardia, unspecified: Secondary | ICD-10-CM | POA: Insufficient documentation

## 2011-10-27 DIAGNOSIS — J020 Streptococcal pharyngitis: Secondary | ICD-10-CM | POA: Insufficient documentation

## 2011-10-27 DIAGNOSIS — J069 Acute upper respiratory infection, unspecified: Secondary | ICD-10-CM

## 2011-10-27 DIAGNOSIS — G43909 Migraine, unspecified, not intractable, without status migrainosus: Secondary | ICD-10-CM | POA: Insufficient documentation

## 2011-10-27 DIAGNOSIS — K219 Gastro-esophageal reflux disease without esophagitis: Secondary | ICD-10-CM | POA: Insufficient documentation

## 2011-10-27 DIAGNOSIS — J45909 Unspecified asthma, uncomplicated: Secondary | ICD-10-CM | POA: Insufficient documentation

## 2011-10-27 HISTORY — DX: Obesity, unspecified: E66.9

## 2011-10-27 HISTORY — DX: Gastro-esophageal reflux disease without esophagitis: K21.9

## 2011-10-27 HISTORY — DX: Migraine, unspecified, not intractable, without status migrainosus: G43.909

## 2011-10-27 LAB — RAPID STREP SCREEN (MED CTR MEBANE ONLY): Streptococcus, Group A Screen (Direct): POSITIVE — AB

## 2011-10-27 LAB — URINALYSIS, ROUTINE W REFLEX MICROSCOPIC
Bilirubin Urine: NEGATIVE
Nitrite: NEGATIVE
Specific Gravity, Urine: 1.022 (ref 1.005–1.030)
Urobilinogen, UA: 1 mg/dL (ref 0.0–1.0)

## 2011-10-27 LAB — COMPREHENSIVE METABOLIC PANEL
BUN: 9 mg/dL (ref 6–23)
Calcium: 9.2 mg/dL (ref 8.4–10.5)
Creatinine, Ser: 0.61 mg/dL (ref 0.50–1.10)
GFR calc Af Amer: >90 mL/min (ref >90)
Glucose, Bld: 94 mg/dL (ref 70–99)
Total Protein: 7 g/dL (ref 6.0–8.3)

## 2011-10-27 LAB — DIFFERENTIAL
Eosinophils Absolute: 0 10*3/uL (ref 0.0–0.7)
Eosinophils Relative: 0 % (ref 0–5)
Lymphs Abs: 1.2 10*3/uL (ref 0.7–4.0)
Monocytes Relative: 7 % (ref 3–12)

## 2011-10-27 LAB — PREGNANCY, URINE: Preg Test, Ur: NEGATIVE

## 2011-10-27 LAB — CBC
Hemoglobin: 12.6 g/dL (ref 12.0–15.0)
MCH: 29.8 pg (ref 26.0–34.0)
MCV: 88.7 fL (ref 78.0–100.0)
RBC: 4.23 MIL/uL (ref 3.87–5.11)

## 2011-10-27 LAB — LIPASE, BLOOD: Lipase: 20 U/L (ref 11–59)

## 2011-10-27 MED ORDER — AZITHROMYCIN 250 MG PO TABS
500.0000 mg | ORAL_TABLET | Freq: Once | ORAL | Status: AC
  Administered 2011-10-27: 500 mg via ORAL
  Filled 2011-10-27: qty 2

## 2011-10-27 MED ORDER — SODIUM CHLORIDE 0.9 % IV BOLUS (SEPSIS)
1000.0000 mL | Freq: Once | INTRAVENOUS | Status: AC
  Administered 2011-10-27: 1000 mL via INTRAVENOUS

## 2011-10-27 MED ORDER — KETOROLAC TROMETHAMINE 30 MG/ML IJ SOLN
30.0000 mg | Freq: Once | INTRAMUSCULAR | Status: AC
  Administered 2011-10-27: 30 mg via INTRAVENOUS
  Filled 2011-10-27: qty 1

## 2011-10-27 MED ORDER — DEXAMETHASONE SODIUM PHOSPHATE 10 MG/ML IJ SOLN
10.0000 mg | Freq: Once | INTRAMUSCULAR | Status: AC
  Administered 2011-10-27: 10 mg via INTRAVENOUS
  Filled 2011-10-27: qty 1

## 2011-10-27 MED ORDER — AZITHROMYCIN 250 MG PO TABS: 250.0000 mg | ORAL_TABLET | Freq: Every day | ORAL | Status: DC

## 2011-10-27 MED ORDER — DIPHENHYDRAMINE HCL 50 MG/ML IJ SOLN
25.0000 mg | Freq: Once | INTRAMUSCULAR | Status: AC
  Administered 2011-10-27: 50 mg via INTRAVENOUS
  Filled 2011-10-27: qty 1

## 2011-10-27 MED ORDER — METOCLOPRAMIDE HCL 5 MG/ML IJ SOLN
10.0000 mg | Freq: Once | INTRAMUSCULAR | Status: AC
  Administered 2011-10-27: 10 mg via INTRAVENOUS
  Filled 2011-10-27: qty 2

## 2011-10-27 MED ORDER — OXYMETAZOLINE HCL 0.05 % NA SOLN
2.0000 | Freq: Once | NASAL | Status: AC
  Administered 2011-10-27: 2 via NASAL
  Filled 2011-10-27: qty 15

## 2011-10-27 MED ORDER — ACETAMINOPHEN 325 MG PO TABS
650.0000 mg | ORAL_TABLET | Freq: Once | ORAL | Status: AC
  Administered 2011-10-27: 650 mg via ORAL
  Filled 2011-10-27: qty 2

## 2011-10-27 NOTE — ED Notes (Signed)
Pt received dinner tray per her request. Pt has received another of NS iv and feels better.

## 2011-10-27 NOTE — ED Notes (Signed)
Received report from [place], RN and pt will be coming to cdu#7 for fever control and to receive more fluids.

## 2011-10-27 NOTE — ED Notes (Signed)
Pt has multiple complaints. Reports sore throat, abd pain, vomiting, chronic migraines.

## 2011-10-27 NOTE — Discharge Instructions (Signed)
Migraine Headache A migraine headache is an intense, throbbing pain on one or both sides of your head. The exact cause of a migraine headache is not always known. A migraine may be caused when nerves in the brain become irritated and release chemicals that cause swelling within blood vessels, causing pain. Many migraine sufferers have a family history of migraines. Before you get a migraine you may or may not get an aura. An aura is a group of symptoms that can predict the beginning of a migraine. An aura may include:  Visual changes such as:   Flashing lights.   Bright spots or zig-zag lines.   Tunnel vision.   Feelings of numbness.   Trouble talking.   Muscle weakness.  SYMPTOMS  Pain on one or both sides of your head.   Pain that is pulsating or throbbing in nature.   Pain that is severe enough to prevent daily activities.   Pain that is aggravated by any daily physical activity.   Nausea (feeling sick to your stomach), vomiting, or both.   Pain with exposure to bright lights, loud noises, or activity.   General sensitivity to bright lights or loud noises.  MIGRAINE TRIGGERS Examples of triggers of migraine headaches include:   Alcohol.   Smoking.   Stress.   It may be related to menses (female menstruation).   Aged cheeses.   Foods or drinks that contain nitrates, glutamate, aspartame, or tyramine.   Lack of sleep.   Chocolate.   Caffeine.   Hunger.   Medications such as nitroglycerine (used to treat chest pain), birth control pills, estrogen, and some blood pressure medications.  DIAGNOSIS  A migraine headache is often diagnosed based on:  Symptoms.   Physical examination.   A computerized X-ray scan (computed tomography, CT) of your head.  TREATMENT  Medications can help prevent migraines if they are recurrent or should they become recurrent. Your caregiver can help you with a medication or treatment program that will be helpful to you.   Lying  down in a dark, quiet room may be helpful.   Keeping a headache diary may help you find a trend as to what may be triggering your headaches.  SEEK IMMEDIATE MEDICAL CARE IF:   You have confusion, personality changes or seizures.   You have headaches that wake you from sleep.   You have an increased frequency in your headaches.   You have a stiff neck.   You have a loss of vision.   You have muscle weakness.   You start losing your balance or have trouble walking.   You feel faint or pass out.  MAKE SURE YOU:   Understand these instructions.   Will watch your condition.   Will get help right away if you are not doing well or get worse.  Document Released: 07/22/2005 Document Revised: 07/11/2011 Document Reviewed: 03/07/2009 [facility] Patient Information 2012 [place], [place].Pharyngitis, Viral and Bacterial Pharyngitis is soreness (inflammation) or infection of the pharynx. It is also called a sore throat. CAUSES  Most sore throats are caused by viruses and are part of a cold. However, some sore throats are caused by strep and other bacteria. Sore throats can also be caused by post nasal drip from draining sinuses, allergies and sometimes from sleeping with an open mouth. Infectious sore throats can be spread from person to person by coughing, sneezing and sharing cups or eating utensils. TREATMENT  Sore throats that are viral usually last 3-4 days. Viral illness will  get better without medications (antibiotics). Strep throat and other bacterial infections will usually begin to get better about 24-48 hours after you begin to take antibiotics. HOME CARE INSTRUCTIONS   If the caregiver feels there is a bacterial infection or if there is a positive strep test, they will prescribe an antibiotic. The full course of antibiotics must be taken. If the full course of antibiotic is not taken, you or your child may become ill again. If you or your child has strep throat and do not finish all of  the medication, serious heart or kidney diseases may develop.   Drink enough water and fluids to keep your urine clear or pale yellow.   Only take over-the-counter or prescription medicines for pain, discomfort or fever as directed by your caregiver.   Get lots of rest.   Gargle with salt water ( tsp. of salt in a glass of water) as often as every 1-2 hours as you need for comfort.   Hard candies may soothe the throat if individual is not at risk for choking. Throat sprays or lozenges may also be used.  SEEK MEDICAL CARE IF:   Large, tender lumps in the neck develop.   A rash develops.   Green, yellow-brown or bloody sputum is coughed up.   Your baby is older than 3 months with a rectal temperature of 100.5 F (38.1 C) or higher for more than 1 day.  SEEK IMMEDIATE MEDICAL CARE IF:   A stiff neck develops.   You or your child are drooling or unable to swallow liquids.   You or your child are vomiting, unable to keep medications or liquids down.   You or your child has severe pain, unrelieved with recommended medications.   You or your child are having difficulty breathing (not due to stuffy nose).   You or your child are unable to fully open your mouth.   You or your child develop redness, swelling, or severe pain anywhere on the neck.   You have a fever.   Your baby is older than 3 months with a rectal temperature of 102 F (38.9 C) or higher.   Your baby is 80 months old or younger with a rectal temperature of 100.4 F (38 C) or higher.  MAKE SURE YOU:   Understand these instructions.   Will watch your condition.   Will get help right away if you are not doing well or get worse.  Document Released: 07/22/2005 Document Revised: 07/11/2011 Document Reviewed: 10/19/2007 [facility] Patient Information 2012 [place], [place].Strep Throat Strep throat is an infection of the throat caused by a bacteria named Streptococcus pyogenes. Your caregiver may call the infection  streptococcal "tonsillitis" or "pharyngitis" depending on whether there are signs of inflammation in the tonsils or back of the throat. Strep throat is most common in children from 82 to 52 years old during the cold months of the year, but it can occur in people of any age during any season. This infection is spread from person to person (contagious) through coughing, sneezing, or other close contact. SYMPTOMS   Fever or chills.   Painful, swollen, red tonsils or throat.   Pain or difficulty when swallowing.   White or yellow spots on the tonsils or throat.   Swollen, tender lymph nodes or "glands" of the neck or under the jaw.   Red rash all over the body (rare).  DIAGNOSIS  Many different infections can cause the same symptoms. A test must be done to  confirm the diagnosis so the right treatment can be given. A "rapid strep test" can help your caregiver make the diagnosis in a few minutes. If this test is not available, a light swab of the infected area can be used for a throat culture test. If a throat culture test is done, results are usually available in a day or two. TREATMENT  Strep throat is treated with antibiotic medicine. HOME CARE INSTRUCTIONS   Gargle with 1 tsp of salt in 1 cup of warm water, 3 to 4 times per day or as needed for comfort.   Family members who also have a sore throat or fever should be tested for strep throat and treated with antibiotics if they have the strep infection.   Make sure everyone in your household washes their hands well.   Do not share food, drinking cups, or personal items that could cause the infection to spread to others.   You may need to eat a soft food diet until your sore throat gets better.   Drink enough water and fluids to keep your urine clear or pale yellow. This will help prevent dehydration.   Get plenty of rest.   Stay home from school, daycare, or work until you have been on antibiotics for 24 hours.   Only take  over-the-counter or prescription medicines for pain, discomfort, or fever as directed by your caregiver.   If antibiotics are prescribed, take them as directed. Finish them even if you start to feel better.  SEEK MEDICAL CARE IF:   The glands in your neck continue to enlarge.   You develop a rash, cough, or earache.   You cough up green, yellow-brown, or bloody sputum.   You have pain or discomfort not controlled by medicines.   Your problems seem to be getting worse rather than better.  SEEK IMMEDIATE MEDICAL CARE IF:   You develop any new symptoms such as vomiting, severe headache, stiff or painful neck, chest pain, shortness of breath, or trouble swallowing.   You develop severe throat pain, drooling, or changes in your voice.   You develop swelling of the neck, or the skin on the neck becomes red and tender.   You have a fever.   You develop signs of dehydration, such as fatigue, dry mouth, and decreased urination.   You become increasingly sleepy, or you cannot wake up completely.  Document Released: 07/19/2000 Document Revised: 07/11/2011 Document Reviewed: 09/20/2010 [facility] Patient Information 2012 [place], [place].Upper Respiratory Infection, Adult An upper respiratory infection (URI) is also sometimes known as the common cold. The upper respiratory tract includes the nose, sinuses, throat, trachea, and bronchi. Bronchi are the airways leading to the lungs. Most people improve within 1 week, but symptoms can last up to 2 weeks. A residual cough may last even longer.  CAUSES Many different viruses can infect the tissues lining the upper respiratory tract. The tissues become irritated and inflamed and often become very moist. Mucus production is also common. A cold is contagious. You can easily spread the virus to others by oral contact. This includes kissing, sharing a glass, coughing, or sneezing. Touching your mouth or nose and then touching a surface, which is then touched  by another person, can also spread the virus. SYMPTOMS  Symptoms typically develop 1 to 3 days after you come in contact with a cold virus. Symptoms vary from person to person. They may include:  Runny nose.   Sneezing.   Nasal congestion.   Sinus irritation.  Sore throat.   Loss of voice (laryngitis).   Cough.   Fatigue.   Muscle aches.   Loss of appetite.   Headache.   Low-grade fever.  DIAGNOSIS  You might diagnose your own cold based on familiar symptoms, since most people get a cold 2 to 3 times a year. Your caregiver can confirm this based on your exam. Most importantly, your caregiver can check that your symptoms are not due to another disease such as strep throat, sinusitis, pneumonia, asthma, or epiglottitis. Blood tests, throat tests, and X-rays are not necessary to diagnose a common cold, but they may sometimes be helpful in excluding other more serious diseases. Your caregiver will decide if any further tests are required. RISKS AND COMPLICATIONS  You may be at risk for a more severe case of the common cold if you smoke cigarettes, have chronic heart disease (such as heart failure) or lung disease (such as asthma), or if you have a weakened immune system. The very young and very old are also at risk for more serious infections. Bacterial sinusitis, middle ear infections, and bacterial pneumonia can complicate the common cold. The common cold can worsen asthma and chronic obstructive pulmonary disease (COPD). Sometimes, these complications can require emergency medical care and may be life-threatening. PREVENTION  The best way to protect against getting a cold is to practice good hygiene. Avoid oral or hand contact with people with cold symptoms. Wash your hands often if contact occurs. There is no clear evidence that vitamin C, vitamin E, echinacea, or exercise reduces the chance of developing a cold. However, it is always recommended to get plenty of rest and practice  good nutrition. TREATMENT  Treatment is directed at relieving symptoms. There is no cure. Antibiotics are not effective, because the infection is caused by a virus, not by bacteria. Treatment may include:  Increased fluid intake. Sports drinks offer valuable electrolytes, sugars, and fluids.   Breathing heated mist or steam (vaporizer or shower).   Eating chicken soup or other clear broths, and maintaining good nutrition.   Getting plenty of rest.   Using gargles or lozenges for comfort.   Controlling fevers with ibuprofen or acetaminophen as directed by your caregiver.   Increasing usage of your inhaler if you have asthma.  Zinc gel and zinc lozenges, taken in the first 24 hours of the common cold, can shorten the duration and lessen the severity of symptoms. Pain medicines may help with fever, muscle aches, and throat pain. A variety of non-prescription medicines are available to treat congestion and runny nose. Your caregiver can make recommendations and may suggest nasal or lung inhalers for other symptoms.  HOME CARE INSTRUCTIONS   Only take over-the-counter or prescription medicines for pain, discomfort, or fever as directed by your caregiver.   Use a warm mist humidifier or inhale steam from a shower to increase air moisture. This may keep secretions moist and make it easier to breathe.   Drink enough water and fluids to keep your urine clear or pale yellow.   Rest as needed.   Return to work when your temperature has returned to normal or as your caregiver advises. You may need to stay home longer to avoid infecting others. You can also use a face mask and careful hand washing to prevent spread of the virus.  SEEK MEDICAL CARE IF:   After the first few days, you feel you are getting worse rather than better.   You need your caregiver's advice about medicines  to control symptoms.   You develop chills, worsening shortness of breath, or brown or red sputum. These may be signs  of pneumonia.   You develop yellow or brown nasal discharge or pain in the face, especially when you bend forward. These may be signs of sinusitis.   You develop a fever, swollen neck glands, pain with swallowing, or white areas in the back of your throat. These may be signs of strep throat.  SEEK IMMEDIATE MEDICAL CARE IF:   You have a fever.   You develop severe or persistent headache, ear pain, sinus pain, or chest pain.   You develop wheezing, a prolonged cough, cough up blood, or have a change in your usual mucus (if you have chronic lung disease).   You develop sore muscles or a stiff neck.  Document Released: 01/15/2001 Document Revised: 07/11/2011 Document Reviewed: 11/23/2010 [facility] Patient Information 2012 [place], [place].

## 2011-10-27 NOTE — ED Provider Notes (Addendum)
History     CSN: [id number]  Arrival date & time 10/27/11  1109   First MD Initiated Contact with Patient 10/27/11 1132      Chief Complaint  Patient presents with  . Abdominal Pain  . Sore Throat  . Migraine    (Consider location/radiation/quality/duration/timing/severity/associated sxs/prior treatment) HPI Patient is a 36 year old female who presents today complaining of diffuse abdominal pain, sore throat, nasal congestion, and her typical migraine. Patient has had some diffuse abdominal discomfort that she rates as 7/10 that began 4 days ago. She's had associated loose stools and increased flatus. She also says that the focus of her pain is over her right upper quadrant. Patient is status post cholecystectomy. Patient endorses nausea and vomiting since her migraine began today. This is a bilateral frontal headache that she rates as a 9/10. Patient has no known sick contacts. She does endorse nasal congestion and postnasal drip. Patient also endorses bilateral otalgia. She describes as a throbbing ache. Nothing has made her symptoms better or worse.There are no other associated or modifying factors.  Past Medical History  Diagnosis Date  . Asthma   . Migraines   . Acid reflux   . Obesity     Past Surgical History  Procedure Date  . Cholecystectomy     Family History  Problem Relation Age of Onset  . Coronary artery disease Father     History  Substance Use Topics  . Smoking status: Never Smoker   . Smokeless tobacco: Not on file  . Alcohol Use: No    OB History    Grav Para Term Preterm Abortions TAB SAB Ect Mult Living                  Review of Systems  Constitutional: Positive for fatigue.  HENT: Positive for ear pain, congestion, sore throat, rhinorrhea and postnasal drip.   Eyes: Positive for photophobia.  Respiratory: Negative.   Cardiovascular: Negative.   Gastrointestinal: Positive for nausea, vomiting, abdominal pain and diarrhea.  Genitourinary:  Negative.   Musculoskeletal: Negative.   Skin: Negative.   Neurological: Positive for headaches.  Hematological: Negative.   Psychiatric/Behavioral: Negative.   All other systems reviewed and are negative.    Allergies  Sulfamethoxazole w/trimethoprim; Tylenol-codeine; Morphine; and Penicillins  Home Medications   Current Outpatient Rx  Name Route Sig Dispense Refill  . LORATADINE 10 MG PO TABS Oral Take 10 mg by mouth daily.    Marland Kitchen MONTELUKAST SODIUM 10 MG PO TABS Oral Take 10 mg by mouth at bedtime.      Marland Kitchen PROMETHAZINE HCL 6.25 MG/5ML PO SYRP Oral Take 10 mLs by mouth 4 (four) times daily as needed. For nausea    . RANITIDINE HCL 150 MG PO TABS Oral Take 150 mg by mouth at bedtime.    . TRAZODONE HCL 100 MG PO TABS Oral Take 200 mg by mouth at bedtime.     . ALBUTEROL SULFATE HFA 108 (90 BASE) MCG/ACT IN AERS Inhalation Inhale 2 puffs into the lungs every 6 (six) hours as needed. For shortness of breath      BP 135/70  Pulse 122  Temp(Src) 100.3 F (37.9 C) (Oral)  Resp 20  Wt 318 lb (144.244 kg)  SpO2 96%  Physical Exam  Nursing note and vitals reviewed. GEN: Well-developed, morbidly obese female in no distress, uncomfortable appearing HEENT: Atraumatic, normocephalic. Oropharynx clear without edema but with symmetric erythema without exudate. Patient has mucosal edema of the naris bilaterally. TMs are  mildly erythematous bilaterally without middle ear effusion or bulging noted.  EYES: PERRLA BL, no scleral icterus. NECK: Trachea midline, no meningismus CV: Tachycardic with regular rhythm. No murmurs, rubs, or gallops PULM: No respiratory distress.  No crackles, wheezes, or rales. GI: soft, there is mild diffuse tenderness to palpation with greatest focus of tenderness in the right upper quadrant. No guarding, rebound. + bowel sounds  GU: deferred Neuro: cranial nerves 2-12 intact, no abnormalities of strength or sensation, A and O x 3 MSK: Patient moves all 4 extremities  symmetrically, no deformity, edema, or injury noted Skin: No rashes petechiae, purpura, or jaundice Psych: no abnormality of mood   ED Course  Procedures (including critical care time)  Labs Reviewed  CBC - Abnormal; Notable for the following:    WBC 11.7 (*)    All other components within normal limits  DIFFERENTIAL - Abnormal; Notable for the following:    Neutrophils Relative 83 (*)    Neutro Abs 9.7 (*)    Lymphocytes Relative 10 (*)    All other components within normal limits  RAPID STREP SCREEN - Abnormal; Notable for the following:    Streptococcus, Group A Screen (Direct) POSITIVE (*)    All other components within normal limits  COMPREHENSIVE METABOLIC PANEL  LIPASE, BLOOD  URINALYSIS, ROUTINE W REFLEX MICROSCOPIC  PREGNANCY, URINE  LAB REPORT - SCANNED  URINALYSIS, ROUTINE W REFLEX MICROSCOPIC  PREGNANCY, URINE   Dg Chest 2 View  10/27/2011  *RADIOLOGY REPORT*  Clinical Data: Chest pain.  CHEST - 2 VIEW  Comparison: PA and lateral chest 08/13/2011  Findings: Lungs are clear.  Heart size is normal.  No pneumothorax or effusion.  IMPRESSION: No acute disease.  Original Report Authenticated By: Bernadene Bell. D'ALESSIO, M.D.     1. Strep pharyngitis   2. Migraine   3. Acute URI       MDM  Patient was evaluated by myself. She was initially tachycardic to 122 with a temperature 100.3. Patient was normotensive. She denied any cough whatsoever. Patient had diffuse abdominal pain and a nonacute abdomen. Her description of her symptoms seemed most consistent with an upper respiratory tract infection that had precipitated some GI symptoms secondary to postnasal drip as well as one of her chronic migraines. Patient was given aspirin as well as IV fluids, Reglan, and Benadryl for migraine. Labs were set for abdominal pain. Patient had a minimal leukocytosis of 11.7.  3:07 PM Patient had no significant findings on her lab results. Patient did have resolution of her headache and  nausea with treatment for migraine. Repeat vitals showed the patient had developed a fever to 102. She continued to be tachycardic to 122. Patient had denied cough initially but was febrile. She was overall feeling clinically improved. She was given a dose of Tylenol and a second liter of fluid was ordered. Chest x-ray and rapid strep were performed as patient had been exposed her son 3-4 weeks ago with this. She did not have exam consistent with strep and had not been treated for this initially. Patient will be moved to the CDU to complete these therapies and then reassessed.  Strep was positive and patient was treated with azithromycin as the patient is allergic to PCN.  CXR was unremarkable.  After additional symptomatic therapy patient was able to be discharged home with prescription for this with azithromycin prescription and zofran prescription if needed.  She was pain free and comfortable with plan for discharge.  She can follow-up with  her regular doctor.      Cyndra Numbers, MD 10/28/11 1224  Cyndra Numbers, MD 10/28/11 1225

## 2011-10-27 NOTE — ED Notes (Signed)
Pt c/o sore throat x1 day, migraine, diarrhea x3 days, vomiting since last night, and abdominal pain x3 days and chills. Pt also c/o of nasal drainage that is green with blood and bilateral ear pain since last night.

## 2011-10-27 NOTE — ED Notes (Signed)
[facility] to [facility] dinner tray.

## 2011-12-24 ENCOUNTER — Emergency Department (HOSPITAL_COMMUNITY)
Admission: EM | Admit: 2011-12-24 | Discharge: 2011-12-24 | Disposition: A | Discharge status: Home / Self Care | Payer: Medicaid Other | Source: Home / Self Care | Attending: Family Medicine | Admitting: Family Medicine

## 2011-12-24 ENCOUNTER — Encounter (HOSPITAL_COMMUNITY): Payer: Self-pay | Admitting: Emergency Medicine

## 2011-12-24 DIAGNOSIS — K0889 Other specified disorders of teeth and supporting structures: Secondary | ICD-10-CM

## 2011-12-24 DIAGNOSIS — K089 Disorder of teeth and supporting structures, unspecified: Secondary | ICD-10-CM

## 2011-12-24 MED ORDER — CLINDAMYCIN HCL 150 MG PO CAPS: 150.0000 mg | ORAL_CAPSULE | Freq: Four times a day (QID) | ORAL | Status: AC

## 2011-12-24 MED ORDER — DICLOFENAC POTASSIUM 50 MG PO TABS: 50.0000 mg | ORAL_TABLET | Freq: Three times a day (TID) | ORAL | Status: DC

## 2011-12-24 NOTE — ED Notes (Signed)
Pt here with right mouth pain r/t cracked bottom tooth x 4 dys ago unrelieved by otc advil,ice and heat.

## 2011-12-24 NOTE — Discharge Instructions (Signed)
Take medicine as prescribed, see a dentist as soon as possible °

## 2011-12-24 NOTE — ED Provider Notes (Signed)
History     CSN: [id number]  Arrival date & time 12/24/11  1305   First MD Initiated Contact with Patient 12/24/11 1313      Chief Complaint  Patient presents with  . Dental Pain    (Consider location/radiation/quality/duration/timing/severity/associated sxs/prior treatment) Patient is a 36 y.o. female presenting with tooth pain. The history is provided by the patient.  Dental PainThe primary symptoms include mouth pain and dental injury. The symptoms began 3 to 5 days ago (teeth are broken with exposed cavity fillings.). The symptoms are worsening. The symptoms are chronic.  Additional symptoms include: jaw pain. Medical issues include: periodontal disease.    Past Medical History  Diagnosis Date  . Asthma   . Migraines   . Acid reflux   . Obesity     Past Surgical History  Procedure Date  . Cholecystectomy     Family History  Problem Relation Age of Onset  . Coronary artery disease Father     History  Substance Use Topics  . Smoking status: Never Smoker   . Smokeless tobacco: Not on file  . Alcohol Use: No    OB History    Grav Para Term Preterm Abortions TAB SAB Ect Mult Living                  Review of Systems  HENT: Positive for dental problem.     Allergies  Acetaminophen-codeine; Sulfamethoxazole w-trimethoprim; Morphine; and Penicillins  Home Medications   Current Outpatient Rx  Name Route Sig Dispense Refill  . ALBUTEROL SULFATE HFA 108 (90 BASE) MCG/ACT IN AERS Inhalation Inhale 2 puffs into the lungs every 6 (six) hours as needed. For shortness of breath    . AZITHROMYCIN 250 MG PO TABS Oral Take 1 tablet (250 mg total) by mouth daily. 4 each 0  . CLINDAMYCIN HCL 150 MG PO CAPS Oral Take 1 capsule (150 mg total) by mouth every 6 (six) hours. 28 capsule 0  . DICLOFENAC POTASSIUM 50 MG PO TABS Oral Take 1 tablet (50 mg total) by mouth 3 (three) times daily. 21 tablet 0  . LORATADINE 10 MG PO TABS Oral Take 10 mg by mouth daily.    Marland Kitchen  MONTELUKAST SODIUM 10 MG PO TABS Oral Take 10 mg by mouth at bedtime.      Marland Kitchen PROMETHAZINE HCL 6.25 MG/5ML PO SYRP Oral Take 10 mLs by mouth 4 (four) times daily as needed. For nausea    . RANITIDINE HCL 150 MG PO TABS Oral Take 150 mg by mouth at bedtime.    . TRAZODONE HCL 100 MG PO TABS Oral Take 200 mg by mouth at bedtime.       BP 121/64  Pulse 96  Temp(Src) 98.4 F (36.9 C) (Oral)  Resp 20  SpO2 98%  LMP 12/13/2011  Physical Exam  Nursing note and vitals reviewed. Constitutional: She appears well-developed and well-nourished.  HENT:  Head: Normocephalic.  Right Ear: External ear normal.  Left Ear: External ear normal.  Mouth/Throat:      ED Course  Procedures (including critical care time)  Labs Reviewed - No data to display No results found.   1. Pain, dental       MDM          Linna Hoff, MD 12/24/11 1451

## 2012-02-23 ENCOUNTER — Emergency Department (HOSPITAL_COMMUNITY)
Admission: EM | Admit: 2012-02-23 | Discharge: 2012-02-24 | Disposition: A | Discharge status: Home / Self Care | Payer: Medicaid Other | Attending: Emergency Medicine | Admitting: Emergency Medicine

## 2012-02-23 DIAGNOSIS — N390 Urinary tract infection, site not specified: Secondary | ICD-10-CM | POA: Insufficient documentation

## 2012-02-24 LAB — URINALYSIS, ROUTINE W REFLEX MICROSCOPIC
Bilirubin Urine: NEGATIVE
Glucose, UA: NEGATIVE mg/dL
Hgb urine dipstick: NEGATIVE
Ketones, ur: NEGATIVE mg/dL
pH: 7 (ref 5.0–8.0)

## 2012-02-24 LAB — URINE MICROSCOPIC-ADD ON

## 2012-02-24 NOTE — ED Notes (Signed)
See downtime charting. 

## 2012-03-16 ENCOUNTER — Encounter (HOSPITAL_COMMUNITY): Payer: Self-pay

## 2012-03-16 ENCOUNTER — Emergency Department (HOSPITAL_COMMUNITY)
Admission: EM | Admit: 2012-03-16 | Discharge: 2012-03-16 | Disposition: A | Discharge status: Home / Self Care | Payer: Medicaid Other | Attending: Emergency Medicine | Admitting: Emergency Medicine

## 2012-03-16 DIAGNOSIS — K219 Gastro-esophageal reflux disease without esophagitis: Secondary | ICD-10-CM | POA: Insufficient documentation

## 2012-03-16 DIAGNOSIS — J45909 Unspecified asthma, uncomplicated: Secondary | ICD-10-CM | POA: Insufficient documentation

## 2012-03-16 DIAGNOSIS — Z880 Allergy status to penicillin: Secondary | ICD-10-CM | POA: Insufficient documentation

## 2012-03-16 DIAGNOSIS — J329 Chronic sinusitis, unspecified: Secondary | ICD-10-CM | POA: Insufficient documentation

## 2012-03-16 DIAGNOSIS — J069 Acute upper respiratory infection, unspecified: Secondary | ICD-10-CM | POA: Insufficient documentation

## 2012-03-16 MED ORDER — DOXYCYCLINE HYCLATE 100 MG PO CAPS: 100.0000 mg | ORAL_CAPSULE | Freq: Two times a day (BID) | ORAL | Status: AC

## 2012-03-16 NOTE — ED Provider Notes (Signed)
History   This chart was scribed for Margarita Grizzle, MD by Melba Coon. The patient was seen in room TR06C/TR06C and the patient's care was started at 7:40PM.    CSN: [id number]  Arrival date & time 03/16/12  1900   First MD Initiated Contact with Patient 03/16/12 1918      Chief Complaint  Patient presents with  . URI    (Consider location/radiation/quality/duration/timing/severity/associated sxs/prior treatment) HPI Grace Fisher is a 36 y.o. female who presents to the Emergency Department complaining of constant, moderate to severe sinus congestion symptoms including sore throat, yellow, thick, mucousy rhinnorhea and dry cough with an onset 2 days ago. Pt also c/o frontal facial pain. No HA, fever, neck pain, rash, back pain, CP, SOB, abd pain, n/v/d, dysuria, or extremity pain, edema, weakness, numbness, or tingling. Allergic to Acetaminophen-codeine; Sulfamethoxazole w-trimethoprim; Morphine; and Penicillins. No other pertinent medical symptoms.  Past Medical History  Diagnosis Date  . Asthma   . Migraines   . Acid reflux   . Obesity     Past Surgical History  Procedure Date  . Cholecystectomy     Family History  Problem Relation Age of Onset  . Coronary artery disease Father     History  Substance Use Topics  . Smoking status: Never Smoker   . Smokeless tobacco: Not on file  . Alcohol Use: No    OB History    Grav Para Term Preterm Abortions TAB SAB Ect Mult Living                  Review of Systems 10 Systems reviewed and all are negative for acute change except as noted in the HPI.  Allergies  Acetaminophen-codeine; Sulfamethoxazole w-trimethoprim; Morphine; and Penicillins  Home Medications   Current Outpatient Rx  Name Route Sig Dispense Refill  . LORATADINE 10 MG PO TABS Oral Take 10 mg by mouth daily.    Marland Kitchen MONTELUKAST SODIUM 10 MG PO TABS Oral Take 10 mg by mouth at bedtime.      Marland Kitchen RANITIDINE HCL 150 MG PO TABS Oral Take 150 mg by mouth  at bedtime.    . TRAZODONE HCL 100 MG PO TABS Oral Take 200 mg by mouth at bedtime.     . ALBUTEROL SULFATE HFA 108 (90 BASE) MCG/ACT IN AERS Inhalation Inhale 2 puffs into the lungs every 6 (six) hours as needed. For shortness of breath    . DOXYCYCLINE HYCLATE 100 MG PO CAPS Oral Take 1 capsule (100 mg total) by mouth 2 (two) times daily. 20 capsule 0    BP 135/69  Pulse 94  Temp 98.7 F (37.1 C) (Oral)  Resp 18  SpO2 96%  Physical Exam  Nursing note and vitals reviewed. Constitutional: She is oriented to person, place, and time. She appears well-developed and well-nourished. No distress.  HENT:  Head: Normocephalic and atraumatic.       Bilateral maxillary and frontal sinus tenderness. TMs clear.  Eyes: EOM are normal.  Neck: Neck supple. No tracheal deviation present.  Cardiovascular: Normal rate.   Pulmonary/Chest: Effort normal. No respiratory distress.  Musculoskeletal: Normal range of motion.  Neurological: She is alert and oriented to person, place, and time.  Skin: Skin is warm and dry.  Psychiatric: She has a normal mood and affect. Her behavior is normal.    ED Course  Procedures (including critical care time)  DIAGNOSTIC STUDIES: Oxygen Saturation is 96% on room air, normal by my interpretation.    COORDINATION  OF CARE:  7:43PM - amoxicillin will be Rx for the pt; pt ready for d/c.  Labs Reviewed - No data to display No results found.   1. Sinusitis       MDM  I personally performed the services described in this documentation, which was scribed in my presence. The recorded information has been reviewed and considered.     Hilario Quarry, MD 03/16/12 2207

## 2012-03-16 NOTE — ED Notes (Signed)
Reports sinus infection, ongoing, sts that ear ache, sinus congestion and thick yellow mucous.

## 2012-05-17 ENCOUNTER — Emergency Department (HOSPITAL_COMMUNITY)
Admission: EM | Admit: 2012-05-17 | Discharge: 2012-05-17 | Disposition: A | Discharge status: Home / Self Care | Payer: Medicaid Other | Attending: Emergency Medicine | Admitting: Emergency Medicine

## 2012-05-17 ENCOUNTER — Encounter (HOSPITAL_COMMUNITY): Payer: Self-pay | Admitting: Emergency Medicine

## 2012-05-17 DIAGNOSIS — K089 Disorder of teeth and supporting structures, unspecified: Secondary | ICD-10-CM

## 2012-05-17 DIAGNOSIS — J45909 Unspecified asthma, uncomplicated: Secondary | ICD-10-CM | POA: Insufficient documentation

## 2012-05-17 DIAGNOSIS — K044 Acute apical periodontitis of pulpal origin: Secondary | ICD-10-CM | POA: Insufficient documentation

## 2012-05-17 DIAGNOSIS — Z886 Allergy status to analgesic agent status: Secondary | ICD-10-CM | POA: Insufficient documentation

## 2012-05-17 DIAGNOSIS — K219 Gastro-esophageal reflux disease without esophagitis: Secondary | ICD-10-CM | POA: Insufficient documentation

## 2012-05-17 DIAGNOSIS — Z880 Allergy status to penicillin: Secondary | ICD-10-CM | POA: Insufficient documentation

## 2012-05-17 DIAGNOSIS — K0381 Cracked tooth: Secondary | ICD-10-CM | POA: Insufficient documentation

## 2012-05-17 DIAGNOSIS — K047 Periapical abscess without sinus: Secondary | ICD-10-CM

## 2012-05-17 MED ORDER — CLINDAMYCIN HCL 150 MG PO CAPS: 150.0000 mg | ORAL_CAPSULE | Freq: Four times a day (QID) | ORAL | Status: DC

## 2012-05-17 MED ORDER — HYDROCODONE-ACETAMINOPHEN 5-325 MG PO TABS: 1.0000 | ORAL_TABLET | Freq: Four times a day (QID) | ORAL | Status: DC | PRN

## 2012-05-17 MED ORDER — IBUPROFEN 600 MG PO TABS: 600.0000 mg | ORAL_TABLET | Freq: Four times a day (QID) | ORAL | Status: DC | PRN

## 2012-05-17 NOTE — ED Notes (Signed)
C/o toothache x 3-4 weeks.  States she has several teeth cracked and has been unable to see a Education officer, community.

## 2012-05-17 NOTE — ED Notes (Signed)
Pt presents to department for evaluation of R lower molar pain. States pain to multiple teeth on R lower side of mouth. Ongoing 3-4weeks. No relief with OTC medications. States she thinks that one of the fillings could of fallen out recently. 10/10 pain at the time. No facial swelling noted. Denies SOB. No signs of acute distress noted.

## 2012-05-17 NOTE — ED Notes (Signed)
PT discharged to home with family. Pt voiced understanding of discharge instructions and prescriptions. NAD.

## 2012-05-17 NOTE — ED Provider Notes (Signed)
History   This chart was scribed for Derwood Kaplan, MD by Charolett Bumpers . The patient was seen in room TR02C/TR02C. Patient's care was started at 1710.  CSN: [id number] Arrival date & time 05/17/12  1650  First MD Initiated Contact with Patient 05/17/12 1710      Chief Complaint  Patient presents with  . Dental Pain    HPI Comments: Grace Fisher is a 36 y.o. female who presents to the Emergency Department complaining of constant, right lower dental pain for the past 3 weeks. She reports that she has 2 cracked teeth on bottom right. She states a filing fell out of bottom right tooth 3 weeks ago. She has taken Ibuprofen, Advil, oralgel with no relief. She denies any n/v, fevers or chills. She denies any pertinent medical hx.   Patient is a 36 y.o. female presenting with tooth pain. The history is provided by the patient. No language interpreter was used.  Dental PainPrimary symptoms do not include fever or shortness of breath. The symptoms began more than 1 week ago. The symptoms are worsening. The symptoms are new. The symptoms occur constantly.  Additional symptoms do not include: gum swelling.    Past Medical History  Diagnosis Date  . Asthma   . Migraines   . Acid reflux   . Obesity     Past Surgical History  Procedure Date  . Cholecystectomy     Family History  Problem Relation Age of Onset  . Coronary artery disease Father     History  Substance Use Topics  . Smoking status: Never Smoker   . Smokeless tobacco: Not on file  . Alcohol Use: No    OB History    Grav Para Term Preterm Abortions TAB SAB Ect Mult Living                  Review of Systems  Constitutional: Negative for fever and chills.  HENT: Positive for dental problem.   Respiratory: Negative for shortness of breath.   Gastrointestinal: Negative for nausea and vomiting.  Neurological: Negative for weakness.  All other systems reviewed and are negative.    Allergies    Acetaminophen-codeine; Sulfamethoxazole w-trimethoprim; Morphine; and Penicillins  Home Medications   Current Outpatient Rx  Name Route Sig Dispense Refill  . ALBUTEROL SULFATE HFA 108 (90 BASE) MCG/ACT IN AERS Inhalation Inhale 2 puffs into the lungs every 6 (six) hours as needed. For shortness of breath    . LORATADINE 10 MG PO TABS Oral Take 10 mg by mouth daily.    Marland Kitchen MONTELUKAST SODIUM 10 MG PO TABS Oral Take 10 mg by mouth at bedtime.      Marland Kitchen RANITIDINE HCL 150 MG PO TABS Oral Take 150 mg by mouth at bedtime.    Marland Kitchen ZOLPIDEM TARTRATE 10 MG PO TABS Oral Take 10 mg by mouth at bedtime.      BP 137/78  Pulse 94  Temp 98.2 F (36.8 C) (Oral)  Resp 18  SpO2 96%  LMP 04/19/2012  Physical Exam  Nursing note and vitals reviewed. Constitutional: She is oriented to person, place, and time. She appears well-developed and well-nourished. No distress.  HENT:  Head: Normocephalic and atraumatic.  Mouth/Throat: Oropharynx is clear and moist. No oropharyngeal exudate.       Tooth #28 and 29 have prior dental work preformed. Positive for erosion. No gingival swelling form lingual or buccal side. No pulp exposure.   Eyes: EOM are normal.  Neck:  Neck supple. No tracheal deviation present.  Cardiovascular: Normal rate, regular rhythm and normal heart sounds.   No murmur heard. Pulmonary/Chest: Effort normal and breath sounds normal. No respiratory distress. She has no wheezes. She has no rhonchi. She has no rales.       Lungs clear to auscultation bilaterally.   Musculoskeletal: Normal range of motion.  Neurological: She is alert and oriented to person, place, and time.  Skin: Skin is warm and dry.  Psychiatric: She has a normal mood and affect. Her behavior is normal.    ED Course  Procedures (including critical care time)  DIAGNOSTIC STUDIES: Oxygen Saturation is 96% on room air, adequate by my interpretation.    COORDINATION OF CARE:  17:57-Discussed planned course of treatment  with the patient including f/u with dentist, who is agreeable at this time.      Labs Reviewed - No data to display No results found.   No diagnosis found.    MDM  Medical screening examination/treatment/procedure(s) were performed by me as the supervising physician. Scribe service was utilized for documentation only.  Pt comes in with cc of dental pain. No evidence of abscess. She has poor dental disease, and likely has periodontal disease. Will give AB, and dentist followup.    Derwood Kaplan, MD 05/17/12 2952

## 2012-07-03 ENCOUNTER — Emergency Department (HOSPITAL_COMMUNITY)
Admission: EM | Admit: 2012-07-03 | Discharge: 2012-07-03 | Disposition: A | Discharge status: Home / Self Care | Payer: Medicaid Other | Attending: Emergency Medicine | Admitting: Emergency Medicine

## 2012-07-03 ENCOUNTER — Encounter (HOSPITAL_COMMUNITY): Payer: Self-pay | Admitting: Emergency Medicine

## 2012-07-03 DIAGNOSIS — K0889 Other specified disorders of teeth and supporting structures: Secondary | ICD-10-CM

## 2012-07-03 DIAGNOSIS — Z8719 Personal history of other diseases of the digestive system: Secondary | ICD-10-CM | POA: Insufficient documentation

## 2012-07-03 DIAGNOSIS — K089 Disorder of teeth and supporting structures, unspecified: Secondary | ICD-10-CM | POA: Insufficient documentation

## 2012-07-03 DIAGNOSIS — E669 Obesity, unspecified: Secondary | ICD-10-CM | POA: Insufficient documentation

## 2012-07-03 DIAGNOSIS — H9209 Otalgia, unspecified ear: Secondary | ICD-10-CM | POA: Insufficient documentation

## 2012-07-03 DIAGNOSIS — J45909 Unspecified asthma, uncomplicated: Secondary | ICD-10-CM | POA: Insufficient documentation

## 2012-07-03 DIAGNOSIS — Z79899 Other long term (current) drug therapy: Secondary | ICD-10-CM | POA: Insufficient documentation

## 2012-07-03 DIAGNOSIS — Z8679 Personal history of other diseases of the circulatory system: Secondary | ICD-10-CM | POA: Insufficient documentation

## 2012-07-03 MED ORDER — OXYCODONE-ACETAMINOPHEN 5-325 MG PO TABS
1.0000 | ORAL_TABLET | Freq: Once | ORAL | Status: DC
  Filled 2012-07-03: qty 1

## 2012-07-03 MED ORDER — HYDROCODONE-ACETAMINOPHEN 5-325 MG PO TABS: 1.0000 | ORAL_TABLET | Freq: Four times a day (QID) | ORAL | Status: DC | PRN

## 2012-07-03 NOTE — ED Notes (Signed)
Pt here for recurring dental pain. Was seen here 10/13 for same. Tx with abx and pain med. Referred to general dentistry. Was seen 2 weeks ago and referred to oral surgeon which she sees on Dec.9th. C/O pain right lower molars.

## 2012-07-03 NOTE — ED Provider Notes (Signed)
History   This chart was scribed for Grace Lennert, MD by Sofie Rower, ED Scribe. The patient was seen in room TR08C/TR08C and the patient's care was started at 11:02AM.     CSN: [id number]  Arrival date & time 07/03/12  1478   First MD Initiated Contact with Patient 07/03/12 1102      Chief Complaint  Patient presents with  . Dental Pain    (Consider location/radiation/quality/duration/timing/severity/associated sxs/prior treatment) Patient is a 36 y.o. female presenting with tooth pain. The history is provided by the patient. No language interpreter was used.  Dental PainThe primary symptoms include mouth pain. Primary symptoms do not include headaches or cough. The symptoms began 2 days ago. The symptoms are worsening. The symptoms are new. The symptoms occur constantly.  Mouth pain began 12 - 24 hours ago. Mouth pain occurs constantly. Mouth pain is worsening. Affected locations include: teeth.  Additional symptoms include: ear pain. Additional symptoms do not include: hearing loss and fatigue.   PCP is Dr. Duanne Guess.   Past Medical History  Diagnosis Date  . Asthma   . Migraines   . Acid reflux   . Obesity     Past Surgical History  Procedure Date  . Cholecystectomy     Family History  Problem Relation Age of Onset  . Coronary artery disease Father     History  Substance Use Topics  . Smoking status: Never Smoker   . Smokeless tobacco: Not on file  . Alcohol Use: No    OB History    Grav Para Term Preterm Abortions TAB SAB Ect Mult Living                  Review of Systems  Constitutional: Negative for fatigue.  HENT: Positive for ear pain. Negative for hearing loss, congestion, sinus pressure and ear discharge.   Eyes: Negative for discharge.  Respiratory: Negative for cough.   Cardiovascular: Negative for chest pain.  Gastrointestinal: Negative for abdominal pain and diarrhea.  Genitourinary: Negative for frequency and hematuria.  Musculoskeletal:  Negative for back pain.  Skin: Negative for rash.  Neurological: Negative for seizures and headaches.  Hematological: Negative.   Psychiatric/Behavioral: Negative for hallucinations.    Allergies  Acetaminophen-codeine; Sulfamethoxazole w-trimethoprim; Morphine; and Penicillins  Home Medications   Current Outpatient Rx  Name  Route  Sig  Dispense  Refill  . ALBUTEROL SULFATE HFA 108 (90 BASE) MCG/ACT IN AERS   Inhalation   Inhale 2 puffs into the lungs every 6 (six) hours as needed. For shortness of breath         . ATENOLOL 25 MG PO TABS   Oral   Take 25 mg by mouth at bedtime.         Marland Kitchen LORATADINE 10 MG PO TABS   Oral   Take 10 mg by mouth daily.         Marland Kitchen MONTELUKAST SODIUM 10 MG PO TABS   Oral   Take 10 mg by mouth at bedtime.           Marland Kitchen RANITIDINE HCL 150 MG PO TABS   Oral   Take 150 mg by mouth at bedtime.         Marland Kitchen TIZANIDINE HCL 4 MG PO TABS   Oral   Take 4 mg by mouth every 8 (eight) hours as needed. For muscle spasms         . ZOLPIDEM TARTRATE 10 MG PO TABS   Oral  Take 10 mg by mouth at bedtime.         Marland Kitchen ZONISAMIDE 100 MG PO CAPS   Oral   Take 300 mg by mouth at bedtime.           BP 132/61  Pulse 73  Temp 97.8 F (36.6 C) (Oral)  Resp 20  SpO2 96%  LMP 06/01/2012  Physical Exam  Nursing note and vitals reviewed. Constitutional: She is oriented to person, place, and time. She appears well-developed.  HENT:  Head: Normocephalic.       Poor dentition. Left lower molars are very tender.   Eyes: Conjunctivae normal are normal.  Neck: No tracheal deviation present.  Cardiovascular:  No murmur heard. Musculoskeletal: Normal range of motion.  Neurological: She is oriented to person, place, and time.  Skin: Skin is warm.  Psychiatric: She has a normal mood and affect.    ED Course  Procedures (including critical care time)  DIAGNOSTIC STUDIES: Oxygen Saturation is 96% on room air, normal by my interpretation.     COORDINATION OF CARE:  11:07 AM- Treatment plan concerning pain management and follow up with dentist discussed with patient. Pt agrees with treatment.      Labs Reviewed - No data to display No results found.   No diagnosis found.    MDM        The chart was scribed for me under my direct supervision.  I personally performed the history, physical, and medical decision making and all procedures in the evaluation of this patient.Grace Lennert, MD 07/03/12 1110

## 2012-07-03 NOTE — ED Notes (Signed)
Pt c/o right lower dental pain x 2 days.

## 2012-07-27 ENCOUNTER — Other Ambulatory Visit (HOSPITAL_COMMUNITY): Payer: Self-pay | Admitting: Oral Surgery

## 2012-08-19 ENCOUNTER — Emergency Department (HOSPITAL_COMMUNITY)
Admission: EM | Admit: 2012-08-19 | Discharge: 2012-08-19 | Disposition: A | Discharge status: Home / Self Care | Payer: Medicaid Other | Attending: Emergency Medicine | Admitting: Emergency Medicine

## 2012-08-19 ENCOUNTER — Encounter (HOSPITAL_COMMUNITY): Payer: Self-pay | Admitting: Emergency Medicine

## 2012-08-19 ENCOUNTER — Emergency Department (HOSPITAL_COMMUNITY): Payer: Medicaid Other

## 2012-08-19 DIAGNOSIS — J45909 Unspecified asthma, uncomplicated: Secondary | ICD-10-CM | POA: Insufficient documentation

## 2012-08-19 DIAGNOSIS — N201 Calculus of ureter: Secondary | ICD-10-CM | POA: Insufficient documentation

## 2012-08-19 DIAGNOSIS — Z79899 Other long term (current) drug therapy: Secondary | ICD-10-CM | POA: Insufficient documentation

## 2012-08-19 DIAGNOSIS — Z8679 Personal history of other diseases of the circulatory system: Secondary | ICD-10-CM | POA: Insufficient documentation

## 2012-08-19 DIAGNOSIS — E669 Obesity, unspecified: Secondary | ICD-10-CM | POA: Insufficient documentation

## 2012-08-19 DIAGNOSIS — Z8719 Personal history of other diseases of the digestive system: Secondary | ICD-10-CM | POA: Insufficient documentation

## 2012-08-19 HISTORY — DX: Calculus of kidney: N20.0

## 2012-08-19 LAB — POCT I-STAT, CHEM 8
BUN: 15 mg/dL (ref 6–23)
Calcium, Ion: 1.15 mmol/L (ref 1.12–1.23)
Creatinine, Ser: 0.7 mg/dL (ref 0.50–1.10)
Glucose, Bld: 117 mg/dL — ABNORMAL HIGH (ref 70–99)
Sodium: 140 mEq/L (ref 135–145)
TCO2: 25 mmol/L (ref 0–100)

## 2012-08-19 LAB — CBC
HCT: 41.1 % (ref 36.0–46.0)
Hemoglobin: 13.9 g/dL (ref 12.0–15.0)
MCH: 30.5 pg (ref 26.0–34.0)
MCHC: 33.8 g/dL (ref 30.0–36.0)
MCV: 90.3 fL (ref 78.0–100.0)
RDW: 13.7 % (ref 11.5–15.5)

## 2012-08-19 LAB — URINALYSIS, ROUTINE W REFLEX MICROSCOPIC
Bilirubin Urine: NEGATIVE
Glucose, UA: NEGATIVE mg/dL
Ketones, ur: NEGATIVE mg/dL
Protein, ur: 30 mg/dL — AB

## 2012-08-19 LAB — URINE MICROSCOPIC-ADD ON

## 2012-08-19 MED ORDER — HYDROMORPHONE HCL PF 1 MG/ML IJ SOLN
1.0000 mg | Freq: Once | INTRAMUSCULAR | Status: AC
  Administered 2012-08-19: 1 mg via INTRAVENOUS
  Filled 2012-08-19: qty 1

## 2012-08-19 MED ORDER — CIPROFLOXACIN HCL 500 MG PO TABS: 500.0000 mg | ORAL_TABLET | Freq: Two times a day (BID) | ORAL | Status: DC

## 2012-08-19 MED ORDER — SODIUM CHLORIDE 0.9 % IV BOLUS (SEPSIS)
1000.0000 mL | Freq: Once | INTRAVENOUS | Status: AC
  Administered 2012-08-19: 1000 mL via INTRAVENOUS

## 2012-08-19 MED ORDER — OXYCODONE-ACETAMINOPHEN 5-325 MG PO TABS: 2.0000 | ORAL_TABLET | ORAL | Status: DC | PRN

## 2012-08-19 MED ORDER — ONDANSETRON HCL 4 MG/2ML IJ SOLN
4.0000 mg | Freq: Once | INTRAMUSCULAR | Status: AC
  Administered 2012-08-19: 4 mg via INTRAVENOUS
  Filled 2012-08-19: qty 2

## 2012-08-19 MED ORDER — DIPHENHYDRAMINE HCL 50 MG/ML IJ SOLN
25.0000 mg | Freq: Once | INTRAMUSCULAR | Status: AC
  Administered 2012-08-19: 25 mg via INTRAVENOUS
  Filled 2012-08-19: qty 1

## 2012-08-19 MED ORDER — METOCLOPRAMIDE HCL 5 MG/ML IJ SOLN
10.0000 mg | Freq: Once | INTRAMUSCULAR | Status: AC
  Administered 2012-08-19: 10 mg via INTRAVENOUS
  Filled 2012-08-19: qty 2

## 2012-08-19 NOTE — ED Notes (Signed)
Pt transported to CT ?

## 2012-08-19 NOTE — ED Provider Notes (Addendum)
History     CSN: [id number]  Arrival date & time 08/19/12  4098   First MD Initiated Contact with Patient 08/19/12 [phone]      Chief Complaint  Patient presents with  . Flank Pain    (Consider location/radiation/quality/duration/timing/severity/associated sxs/prior treatment) Patient is a 37 y.o. female presenting with flank pain. The history is provided by the patient.  Flank Pain This is a new problem. The current episode started 1 to 2 hours ago. The problem occurs constantly. The problem has not changed since onset.Associated symptoms include abdominal pain. Nothing aggravates the symptoms. Nothing relieves the symptoms.    Past Medical History  Diagnosis Date  . Asthma   . Migraines   . Acid reflux   . Obesity     Past Surgical History  Procedure Date  . Cholecystectomy     Family History  Problem Relation Age of Onset  . Coronary artery disease Father     History  Substance Use Topics  . Smoking status: Never Smoker   . Smokeless tobacco: Not on file  . Alcohol Use: No    OB History    Grav Para Term Preterm Abortions TAB SAB Ect Mult Living                  Review of Systems  Gastrointestinal: Positive for abdominal pain.  Genitourinary: Positive for flank pain.  All other systems reviewed and are negative.    Allergies  Acetaminophen-codeine; Sulfamethoxazole w-trimethoprim; Morphine; and Penicillins  Home Medications   Current Outpatient Rx  Name  Route  Sig  Dispense  Refill  . ALBUTEROL SULFATE HFA 108 (90 BASE) MCG/ACT IN AERS   Inhalation   Inhale 2 puffs into the lungs every 6 (six) hours as needed. For shortness of breath         . ATENOLOL 25 MG PO TABS   Oral   Take 25 mg by mouth at bedtime.         Marland Kitchen HYDROCODONE-ACETAMINOPHEN 5-325 MG PO TABS   Oral   Take 1 tablet by mouth every 6 (six) hours as needed for pain.   40 tablet   0   . LORATADINE 10 MG PO TABS   Oral   Take 10 mg by mouth daily.         Marland Kitchen  MONTELUKAST SODIUM 10 MG PO TABS   Oral   Take 10 mg by mouth at bedtime.           Marland Kitchen RANITIDINE HCL 150 MG PO TABS   Oral   Take 150 mg by mouth at bedtime.         Marland Kitchen TIZANIDINE HCL 4 MG PO TABS   Oral   Take 4 mg by mouth every 8 (eight) hours as needed. For muscle spasms         . ZOLPIDEM TARTRATE 10 MG PO TABS   Oral   Take 10 mg by mouth at bedtime.           BP 155/85  Pulse 79  Temp 97.4 F (36.3 C) (Oral)  Resp 22  SpO2 97%  LMP 08/12/2012  Physical Exam  Constitutional: She is oriented to person, place, and time. She appears well-developed and well-nourished.  HENT:  Head: Normocephalic and atraumatic.  Eyes: Conjunctivae normal and EOM are normal. Pupils are equal, round, and reactive to light.  Neck: Normal range of motion.  Cardiovascular: Normal rate, regular rhythm and normal heart sounds.   Pulmonary/Chest:  Effort normal and breath sounds normal.  Abdominal: Soft. Bowel sounds are normal.  Musculoskeletal: Normal range of motion.  Neurological: She is alert and oriented to person, place, and time.  Skin: Skin is warm and dry.  Psychiatric: She has a normal mood and affect. Her behavior is normal.    ED Course  Procedures (including critical care time)   Labs Reviewed  CBC  URINALYSIS, ROUTINE W REFLEX MICROSCOPIC   No results found.   No diagnosis found.    MDM  + abd pain, hx of kidney stones.  Will ct analgesia,  reassess  + rt proximal ureterostone.  Pain improved.  tol po.  Will dc with abx, analgesia, to [facility] outpt urology, ret new/worsening sxs      Henna Derderian Lytle Michaels, MD 08/19/12 0554  Bobbye Petti Lytle Michaels, MD 08/19/12 [phone]

## 2012-08-19 NOTE — ED Notes (Signed)
Pt for discharge.Pt still drowsy and not able to drive.

## 2012-08-19 NOTE — ED Notes (Signed)
Pt does not have to go to the bathroom at this time.

## 2012-08-19 NOTE — ED Notes (Signed)
Per pt, pt woke up tonight with severe Right sided flank pain radiating to the front Right side. Pt has had kidney stones in the past, and states this feels similar. Pt has had diarrhea all day yesterday. Pt denies blood in urine.

## 2012-08-20 ENCOUNTER — Emergency Department (HOSPITAL_COMMUNITY)
Admission: EM | Admit: 2012-08-20 | Discharge: 2012-08-21 | Disposition: A | Discharge status: Home / Self Care | Payer: Medicaid Other | Attending: Emergency Medicine | Admitting: Emergency Medicine

## 2012-08-20 ENCOUNTER — Encounter (HOSPITAL_COMMUNITY): Payer: Self-pay | Admitting: *Deleted

## 2012-08-20 DIAGNOSIS — E669 Obesity, unspecified: Secondary | ICD-10-CM | POA: Insufficient documentation

## 2012-08-20 DIAGNOSIS — R319 Hematuria, unspecified: Secondary | ICD-10-CM | POA: Insufficient documentation

## 2012-08-20 DIAGNOSIS — Z3202 Encounter for pregnancy test, result negative: Secondary | ICD-10-CM | POA: Insufficient documentation

## 2012-08-20 DIAGNOSIS — J45909 Unspecified asthma, uncomplicated: Secondary | ICD-10-CM | POA: Insufficient documentation

## 2012-08-20 DIAGNOSIS — Z79899 Other long term (current) drug therapy: Secondary | ICD-10-CM | POA: Insufficient documentation

## 2012-08-20 DIAGNOSIS — Z9089 Acquired absence of other organs: Secondary | ICD-10-CM | POA: Insufficient documentation

## 2012-08-20 DIAGNOSIS — Z8679 Personal history of other diseases of the circulatory system: Secondary | ICD-10-CM | POA: Insufficient documentation

## 2012-08-20 DIAGNOSIS — K219 Gastro-esophageal reflux disease without esophagitis: Secondary | ICD-10-CM | POA: Insufficient documentation

## 2012-08-20 DIAGNOSIS — N200 Calculus of kidney: Secondary | ICD-10-CM

## 2012-08-20 LAB — CBC WITH DIFFERENTIAL/PLATELET
Eosinophils Relative: 1 % (ref 0–5)
HCT: 44 % (ref 36.0–46.0)
Lymphocytes Relative: 27 % (ref 12–46)
Lymphs Abs: 3 10*3/uL (ref 0.7–4.0)
MCV: 90.7 fL (ref 78.0–100.0)
Monocytes Absolute: 0.7 10*3/uL (ref 0.1–1.0)
RBC: 4.85 MIL/uL (ref 3.87–5.11)
RDW: 13.5 % (ref 11.5–15.5)
WBC: 11.1 10*3/uL — ABNORMAL HIGH (ref 4.0–10.5)

## 2012-08-20 LAB — COMPREHENSIVE METABOLIC PANEL
CO2: 27 mEq/L (ref 19–32)
Calcium: 10.4 mg/dL (ref 8.4–10.5)
Creatinine, Ser: 1.12 mg/dL — ABNORMAL HIGH (ref 0.50–1.10)
GFR calc Af Amer: 71 mL/min — ABNORMAL LOW (ref >90)
GFR calc non Af Amer: 61 mL/min — ABNORMAL LOW (ref >90)
Glucose, Bld: 97 mg/dL (ref 70–99)

## 2012-08-20 LAB — URINE CULTURE

## 2012-08-20 MED ORDER — KETOROLAC TROMETHAMINE 60 MG/2ML IM SOLN
60.0000 mg | Freq: Once | INTRAMUSCULAR | Status: AC
  Administered 2012-08-20: 60 mg via INTRAMUSCULAR
  Filled 2012-08-20: qty 2

## 2012-08-20 MED ORDER — FENTANYL CITRATE 0.05 MG/ML IJ SOLN
50.0000 ug | Freq: Once | INTRAMUSCULAR | Status: AC
  Administered 2012-08-21: 50 ug via INTRAMUSCULAR
  Filled 2012-08-20: qty 2

## 2012-08-20 NOTE — ED Notes (Addendum)
Pt states diagnosed with a kidney stone a week ago was sent home with rx pain medications, an antibiotic. Pt has been taking the antibiotic and pain medication with no relief. Pt states that today she noticed blood in her urine that was not there the first time she was diagnosed. Pt took a vicoden and 16:00 and a percocet at 18:30. Pt states pain is still not going away. Pt states that her kidney stone is high and 4mm.

## 2012-08-21 ENCOUNTER — Emergency Department (HOSPITAL_COMMUNITY): Payer: Medicaid Other

## 2012-08-21 LAB — URINALYSIS, ROUTINE W REFLEX MICROSCOPIC
Ketones, ur: 15 mg/dL — AB
Nitrite: NEGATIVE
Protein, ur: 30 mg/dL — AB
Urobilinogen, UA: 0.2 mg/dL (ref 0.0–1.0)

## 2012-08-21 LAB — URINE MICROSCOPIC-ADD ON

## 2012-08-21 MED ORDER — KETOROLAC TROMETHAMINE 10 MG PO TABS: 10.0000 mg | ORAL_TABLET | Freq: Four times a day (QID) | ORAL | Status: DC | PRN

## 2012-08-21 NOTE — ED Notes (Signed)
Patient transported to X-ray 

## 2012-08-21 NOTE — ED Provider Notes (Signed)
History     CSN: [id number]  Arrival date & time 08/20/12  2031   First MD Initiated Contact with Patient 08/20/12 2306      Chief Complaint  Patient presents with  . Flank Pain    (Consider location/radiation/quality/duration/timing/severity/associated sxs/prior treatment) Patient is a 37 y.o. female presenting with flank pain. The history is provided by the patient.  Flank Pain This is a recurrent problem. The current episode started 12 to 24 hours ago. The problem occurs constantly. The problem has not changed since onset.Pertinent negatives include no chest pain, no abdominal pain, no headaches and no shortness of breath. Nothing aggravates the symptoms. Nothing relieves the symptoms. Treatments tried: percocet. The treatment provided no relief.    Past Medical History  Diagnosis Date  . Asthma   . Migraines   . Acid reflux   . Obesity   . Renal stones t    Past Surgical History  Procedure Date  . Cholecystectomy     Family History  Problem Relation Age of Onset  . Coronary artery disease Father     History  Substance Use Topics  . Smoking status: Never Smoker   . Smokeless tobacco: Not on file  . Alcohol Use: No    OB History    Grav Para Term Preterm Abortions TAB SAB Ect Mult Living                  Review of Systems  Respiratory: Negative for shortness of breath.   Cardiovascular: Negative for chest pain.  Gastrointestinal: Negative for abdominal pain.  Genitourinary: Positive for hematuria and flank pain.  Neurological: Negative for headaches.  All other systems reviewed and are negative.    Allergies  Acetaminophen-codeine; Sulfa antibiotics; Morphine; and Penicillins  Home Medications   Current Outpatient Rx  Name  Route  Sig  Dispense  Refill  . ALBUTEROL SULFATE HFA 108 (90 BASE) MCG/ACT IN AERS   Inhalation   Inhale 2 puffs into the lungs every 6 (six) hours as needed. For shortness of breath         . ATENOLOL 25 MG PO TABS  Oral   Take 25 mg by mouth at bedtime.         Marland Kitchen CIPROFLOXACIN HCL 500 MG PO TABS   Oral   Take 500 mg by mouth 2 (two) times daily. Start date 08/19/12; Duration unknown to pt         . LORATADINE 10 MG PO TABS   Oral   Take 10 mg by mouth daily.         Marland Kitchen MONTELUKAST SODIUM 10 MG PO TABS   Oral   Take 10 mg by mouth at bedtime.           . OXYCODONE-ACETAMINOPHEN 5-325 MG PO TABS   Oral   Take 2 tablets by mouth every 4 (four) hours as needed. For pain         . RANITIDINE HCL 150 MG PO TABS   Oral   Take 150 mg by mouth at bedtime.         Marland Kitchen TIZANIDINE HCL 4 MG PO TABS   Oral   Take 4 mg by mouth every 8 (eight) hours as needed. For muscle spasms         . ZOLPIDEM TARTRATE 10 MG PO TABS   Oral   Take 10 mg by mouth at bedtime.           BP 148/88  Pulse 79  Temp 98.4 F (36.9 C) (Oral)  Resp 16  SpO2 99%  LMP 08/12/2012  Physical Exam  Constitutional: She is oriented to person, place, and time. She appears well-developed and well-nourished. No distress.  HENT:  Head: Normocephalic and atraumatic.  Mouth/Throat: Oropharynx is clear and moist.  Eyes: Conjunctivae normal are normal. Pupils are equal, round, and reactive to light.  Neck: Normal range of motion. Neck supple.  Cardiovascular: Normal rate, regular rhythm and intact distal pulses.   Pulmonary/Chest: Effort normal and breath sounds normal. She has no wheezes. She has no rales.  Abdominal: Soft. Bowel sounds are normal. There is no tenderness. There is no rebound and no guarding.  Musculoskeletal: Normal range of motion.  Neurological: She is alert and oriented to person, place, and time.  Skin: Skin is warm and dry.  Psychiatric: She has a normal mood and affect.    ED Course  Procedures (including critical care time)  Labs Reviewed  CBC WITH DIFFERENTIAL - Abnormal; Notable for the following:    WBC 11.1 (*)     All other components within normal limits  COMPREHENSIVE  METABOLIC PANEL - Abnormal; Notable for the following:    Creatinine, Ser 1.12 (*)  DELTA CHECK NOTED   GFR calc non Af Amer 61 (*)     GFR calc Af Amer 71 (*)     All other components within normal limits  URINALYSIS, ROUTINE W REFLEX MICROSCOPIC   Ct Abdomen Pelvis Wo Contrast  08/19/2012  *RADIOLOGY REPORT*  Clinical Data:  Right flank pain and right lower quadrant pain  CT ABDOMEN AND PELVIS WITHOUT CONTRAST  Technique:  Multidetector CT imaging of the abdomen and pelvis was performed following the standard protocol without intravenous contrast.  Comparison: None.  Findings:  Lower Chest:  The lung bases are clear.  Negative for focal consolidation or suspicious pulmonary nodule.  The visualized cardiac structures within normal limits for size.  No pericardial effusion.  The visualized distal esophagus is unremarkable.  Abdomen: Evaluation of the solid abdominal organs and vascular structures is significantly limited in the absence of intravenous contrast.  Given these limitations, unremarkable stomach, duodenum, adrenal glands, pancreas, and liver.  The gallbladder is surgically absent. There is a partially obstructing 3 x 5 mm calculus in the proximal right ureter at the region of the ureteropelvic junction with associated mild hydronephrosis and swelling of the right renal parenchyma.  Mild perinephric stranding.  No additional radiopaque calculi identified.  Peri ureteral stranding is noted about the renal pelvis and proximal right ureter.  Normal-caliber large and small bowel throughout the abdomen.  No free fluid or suspicious adenopathy.  Normal appendix in the right lower quadrant.  Pelvis: Unremarkable appearance of the uterus, adnexa and bladder. There are a few scattered sigmoid colonic diverticula without evidence of active inflammation.  Bones: No acute fracture or aggressive appearing lytic or blastic osseous lesion.  Vascular: Limited evaluation the absence of intravenous contrast  material.  No significant calcified atherosclerotic vascular disease or evidence of aneurysmal dilatation.  IMPRESSION:  1.  At least partially obstructing right UPJ stone measures 3 x 5 mm.  Associated mild - moderate right hydro nephrosis, renal edema, peripelvic and periureteral stranding.  2.  Surgical changes of prior cholecystectomy.   Original Report Authenticated By: Malachy Moan, M.D.      No diagnosis found.    MDM  Pain free will add po toradol and strainer follow up with urology for ongoing care.  Patient verbalizes understanding and agrees to follow up       Raeya Merritts Smitty Cords, MD 08/21/12 0202

## 2012-08-24 ENCOUNTER — Encounter (HOSPITAL_COMMUNITY)
Admission: RE | Admit: 2012-08-24 | Discharge: 2012-08-24 | Payer: Medicaid Other | Source: Ambulatory Visit | Attending: Oral Surgery | Admitting: Oral Surgery

## 2012-08-24 NOTE — Progress Notes (Signed)
I spoke with Ms Rogers Blocker, she said she is cancelling her surgical procedure. She said that she left a message for MD today.

## 2012-08-24 NOTE — Pre-Procedure Instructions (Signed)
Grace Fisher  08/24/2012   Your procedure is scheduled on:  Friday, January 24th.  Report to [facility] at  0730 AM.  Call this number if you have problems the morning of surgery: [phone]   Remember:   Do not eat food or drink liquids after midnight.   Take these medicines the morning of surgery with A SIP OF WATER: Cipro, Claritin.  Use inhalers. Bring Albuterol inhaler to the hospital with you.  May take Oxycodone- Acetaminophen and Zanaflex if needed.   Do not wear jewelry, make-up or nail polish.  Do not wear lotions, powders, or perfumes. You may wear deodorant.  Do not shave 48 hours prior to surgery. Men may shave face and neck.  Do not bring valuables to the hospital.  Contacts, dentures or bridgework may not be worn into surgery.  Leave suitcase in the car. After surgery it may be brought to your room.  For patients admitted to the hospital, checkout time is 11:00 AM the day of  discharge.   Patients discharged the day of surgery will not be allowed to drive  home.  Name and phone number of your driver: ---   Special Instructions: Shower using CHG 2 nights before surgery and the night before surgery.  If you shower the day of surgery use CHG.  Use special wash - you have one bottle of CHG for all showers.  You should use approximately 1/3 of the bottle for each shower.   Please read over the following fact sheets that you were given: Pain Booklet, Coughing and Deep Breathing and Surgical Site Infection Prevention

## 2012-08-26 DIAGNOSIS — N200 Calculus of kidney: Secondary | ICD-10-CM | POA: Insufficient documentation

## 2012-08-28 ENCOUNTER — Ambulatory Visit (HOSPITAL_COMMUNITY): Admission: RE | Admit: 2012-08-28 | Payer: Medicaid Other | Source: Ambulatory Visit | Admitting: Oral Surgery

## 2012-08-28 ENCOUNTER — Encounter (HOSPITAL_COMMUNITY): Admission: RE | Payer: Self-pay | Source: Ambulatory Visit

## 2012-08-28 SURGERY — MULTIPLE EXTRACTION WITH ALVEOLOPLASTY
Anesthesia: General | Site: Mouth | Laterality: Bilateral

## 2012-11-03 DIAGNOSIS — J40 Bronchitis, not specified as acute or chronic: Secondary | ICD-10-CM

## 2012-11-03 HISTORY — DX: Bronchitis, not specified as acute or chronic: J40

## 2012-11-17 ENCOUNTER — Encounter (HOSPITAL_COMMUNITY): Payer: Self-pay | Admitting: Pharmacy Technician

## 2012-11-23 ENCOUNTER — Encounter (HOSPITAL_COMMUNITY)
Admission: RE | Admit: 2012-11-23 | Discharge: 2012-11-23 | Disposition: A | Discharge status: Home / Self Care | Payer: Medicaid Other | Source: Ambulatory Visit | Attending: Oral Surgery | Admitting: Oral Surgery

## 2012-11-23 NOTE — Pre-Procedure Instructions (Addendum)
Grace Fisher  11/23/2012   Your procedure is scheduled on: Monday, November 30, 2012  Report to [facility] at 5:30 AM.  Call this number if you have problems the morning of surgery: [phone]   Remember:   Do not eat food or drink liquids after midnight.   Take these medicines the morning of surgery with A SIP OF WATER: atenolol (TENORMIN) 50 MG, clindamycin (CLEOCIN) 300 MG, loratadine (CLARITIN) 10 MG tablet, if needed: albuterol (PROVENTIL HFA;VENTOLIN HFA) 108 (90 BASE) MCG/ACT inhaler, Hydrocodone-Acetaminophen 5-300 MG TABS  tiZANidine (ZANAFLEX) 4 MG tablet  promethazine (PHENERGAN) 6.25 MG/5ML syrup   cyclobenzaprine (FLEXERIL) 10 MG tablet  Stop taking Aspirin, Coumadin, Plavix, Effient, and herbal medications. Do not take any NSAIDs ie: Ibuprofen, Advil, Naproxen or any medication containing Aspirin.                Do not wear jewelry, make-up or nail polish.  Do not wear lotions, powders, or perfumes. You may wear deodorant.  Do not shave 48 hours prior to surgery.   Do not bring valuables to the hospital.  Contacts, dentures or bridgework may not be worn into surgery.  Leave suitcase in the car. After surgery it may be brought to your room.  For patients admitted to the hospital, checkout time is 11:00 AM the day of discharge.   Patients discharged the day of surgery will not be allowed to drive home.  Name and phone number of your driver:   Special Instructions: Shower using CHG 2 nights before surgery and the night before surgery.  If you shower the day of surgery use CHG.  Use special wash - you have one bottle of CHG for all showers.  You should use approximately 1/3 of the bottle for each shower.   Please read over the following fact sheets that you were given: Pain Booklet, Coughing and Deep Breathing and Surgical Site Infection Prevention

## 2012-11-26 ENCOUNTER — Encounter (HOSPITAL_COMMUNITY)
Admission: RE | Admit: 2012-11-26 | Discharge: 2012-11-26 | Disposition: A | Discharge status: Home / Self Care | Payer: Medicaid Other | Source: Ambulatory Visit | Attending: Oral Surgery | Admitting: Oral Surgery

## 2012-11-26 ENCOUNTER — Encounter (HOSPITAL_COMMUNITY): Payer: Self-pay

## 2012-11-26 HISTORY — DX: Insomnia, unspecified: G47.00

## 2012-11-26 HISTORY — DX: Adverse effect of unspecified anesthetic, initial encounter: T41.45XA

## 2012-11-26 HISTORY — DX: Other seasonal allergic rhinitis: J30.2

## 2012-11-26 HISTORY — DX: Bronchitis, not specified as acute or chronic: J40

## 2012-11-26 HISTORY — DX: Carpal tunnel syndrome, bilateral upper limbs: G56.03

## 2012-11-26 HISTORY — DX: Depression, unspecified: F32.A

## 2012-11-26 HISTORY — DX: Shortness of breath: R06.02

## 2012-11-26 HISTORY — DX: Major depressive disorder, single episode, unspecified: F32.9

## 2012-11-26 HISTORY — DX: Other complications of anesthesia, initial encounter: T88.59XA

## 2012-11-26 HISTORY — DX: Other specified postprocedural states: R11.2

## 2012-11-26 HISTORY — DX: Acute stress reaction: F43.0

## 2012-11-26 HISTORY — DX: Family history of other specified conditions: Z84.89

## 2012-11-26 HISTORY — DX: Panic disorder (episodic paroxysmal anxiety): F41.0

## 2012-11-26 HISTORY — DX: Other specified postprocedural states: Z98.890

## 2012-11-26 HISTORY — DX: Mental disorder, not otherwise specified: F99

## 2012-11-26 HISTORY — DX: Anxiety disorder, unspecified: F41.9

## 2012-11-26 HISTORY — DX: Other constipation: K59.09

## 2012-11-26 HISTORY — DX: Essential (primary) hypertension: I10

## 2012-11-26 HISTORY — DX: Frequency of micturition: R35.0

## 2012-11-26 HISTORY — DX: Malignant melanoma of skin, unspecified: C43.9

## 2012-11-26 LAB — CBC
HCT: 37.4 % (ref 36.0–46.0)
Hemoglobin: 12.5 g/dL (ref 12.0–15.0)
RBC: 4.21 MIL/uL (ref 3.87–5.11)
RDW: 13.6 % (ref 11.5–15.5)
WBC: 7.3 10*3/uL (ref 4.0–10.5)

## 2012-11-26 LAB — BASIC METABOLIC PANEL
CO2: 26 mEq/L (ref 19–32)
Chloride: 105 mEq/L (ref 96–112)
Creatinine, Ser: 0.63 mg/dL (ref 0.50–1.10)
GFR calc Af Amer: >90 mL/min (ref >90)
Potassium: 3.6 mEq/L (ref 3.5–5.1)
Sodium: 142 mEq/L (ref 135–145)

## 2012-11-26 NOTE — Progress Notes (Signed)
Anesthesia PAT Evaluation:  Patient is a 37 year old female scheduled for multiple teeth extraction on 11/30/12 by Dr. Barbette Merino.  History includes morbid obesity (BMI 55), non-smoker, asthma, reflux, anxiety, panic attacks, HTN, melanoma, depression, cholecystectomy. She completed treatment for bronchitis approximately one week ago, and feels back to her baseline.  She was treated with antibiotics, but did not require steroids.  She only needs her MDI/nebulizer as needed.  OSA screening score was 4 or higher.  PCP is Georgette Shell, PA-C with [phone].  Anesthesia concerns include difficult IV stick, anxiety, post-operative N/V, and SOB and fever of ~ 100-101 in PACU following her cholecystectomy on 12/19/08.  She did not require re-intubation.  She was kept overnight, but says she developed a cough several days later and was treated for bronchitis or PNA.  (I reviewed her anesthesia records and discharge summary from May 2010.  There is no mention of fever or SOB, but was kept overnight secondary to "pain/anxiety.") She said exercise and strong smells can trigger her asthma.  She denies known history of OSA, but her screening score was positive today.  She also has frequent migraines which tend to be exacerbated by anesthesia.  She is also quite anxious about any procedure and required 4 Valium (dose?) which didn't really calm her prior to a cavity filling.  She denies known personal or family history of malignant hyperthermia.  Her 62 year old son was born with cleft lip and palate and has required multiple surgeries.  After a few of his surgeries, he required supplemental oxygen post-operatively for SOB.    Meds include albuterol MDI, atenolol, Flexeril, Xopenox nebulizer, Claritin, Singulair, Phenergan, Zantac, Zanaflex, Neurontin, Ambien.  Exam shows a pleasant, obese Caucasian female in NAD.  Heart RRR, no murmur noted.  Lungs clear.  Neck is large.  LE edema present, which she feels is stable.  EKG on  11/26/12 showed NSR with sinus arrhythmia, cannot rule out anterior infarct (age undetermined).  Currently, there are no comparison EKGs available.  Patient denies previous cardiac studies.  She denies chest pain or known history of CAD/MI/CHF.  She has chronic LE edema and DOE.  She is not particularly active, but can sweep and vacuum.    CXR on 11/26/12 showed no acute abnormality.  Preoperative labs WNL.  Patient has multiple anesthesia concerns.  Although she had a mild immediate post-operative fever, her input points more to a  pulmonary source.  It does not sound classic for MH and her hospital course and discharge summary do no reflect any concern for anesthesia complication.  (Aneshtesiologist Dr. Krista Blue agrees.) She will likely need an anxiolytic preoperatively.  She requests a Engineer, structural or IV team for IV access.  She is concerned that the anesthetic "smells" could trigger an asthma exacerbation perioperatively. She was told to use her albuterol or Xopenox on the day of surgery.    Additional beta agonist treatment may be needed preemptively and PRN.  She may also need OSA precautions since her OSA screen was 4 or greater.  Her significant other will be at the home if she is felt appropriate for discharge on the day of surgery.  She will be evaluated by her assigned anesthesiologist on the day of surgery to discuss the definitive anesthesia plan.  Velna Ochs [facility]/Anesthesiology Phone [phone] 11/26/2012 4:30 PM

## 2012-11-26 NOTE — H&P (Signed)
HISTORY AND PHYSICAL  Grace Fisher is a 37 y.o. female patient with CC: Painful teeth. Severe dental phobia.  No diagnosis found.  Past Medical History  Diagnosis Date  . Asthma   . Migraines   . Acid reflux   . Obesity   . Renal stones t  . Complication of anesthesia     Had high fever and SOB after anesthesia  . PONV (postoperative nausea and vomiting)   . Family history of anesthesia complication     Son also has difficult time breathing after anesthesia  . Hypertension   . Shortness of breath   . Anxiety   . Mental disorder   . Depression   . Panic attack as reaction to stress   . Constipation, chronic   . Bronchitis 11/2012  . Urinary frequency     difficulty emptying bladder completely  . Melanoma     on back and arms  . Seasonal allergies   . Insomnia   . Bilateral carpal tunnel syndrome     No current facility-administered medications for this encounter.   Current Outpatient Prescriptions  Medication Sig Dispense Refill  . albuterol (PROVENTIL HFA;VENTOLIN HFA) 108 (90 BASE) MCG/ACT inhaler Inhale 2 puffs into the lungs every 6 (six) hours as needed for shortness of breath.       Marland Kitchen atenolol (TENORMIN) 50 MG tablet Take 25 mg by mouth daily.      . clindamycin (CLEOCIN) 300 MG capsule Take 300 mg by mouth daily.      . cyclobenzaprine (FLEXERIL) 10 MG tablet Take 10 mg by mouth every 8 (eight) hours as needed (for migraines).      . gabapentin (NEURONTIN) 300 MG capsule Take 300 mg by mouth at bedtime.      Marland Kitchen Hydrocodone-Acetaminophen 5-300 MG TABS Take 1 tablet by mouth 3 (three) times daily as needed.      . levalbuterol (XOPENEX) 0.63 MG/3ML nebulizer solution Take 1 ampule by nebulization every 4 (four) hours as needed for shortness of breath.      . loratadine (CLARITIN) 10 MG tablet Take 10 mg by mouth daily.      . montelukast (SINGULAIR) 10 MG tablet Take 10 mg by mouth at bedtime.        . Multiple Vitamin (MULTIVITAMIN WITH MINERALS) TABS Take 2  tablets by mouth daily.      . promethazine (PHENERGAN) 6.25 MG/5ML syrup Take 6.25 mg by mouth 3 (three) times daily as needed (for nausea associated with migraines).      . ranitidine (ZANTAC) 150 MG tablet Take 150 mg by mouth at bedtime.      Marland Kitchen tiZANidine (ZANAFLEX) 4 MG tablet Take 4 mg by mouth every 8 (eight) hours as needed (for migraines).       . zolpidem (AMBIEN) 10 MG tablet Take 10 mg by mouth at bedtime.       Allergies  Allergen Reactions  . Acetaminophen-Codeine Other (See Comments)    severe headache  . Morphine Rash  . Penicillins Rash  . Sulfa Antibiotics Palpitations    REACTION:  fever   Active Problems:   * No active hospital problems. *  Vitals: There were no vitals taken for this visit. Lab results: Results for orders placed during the hospital encounter of 11/26/12 (from the past 24 hour(s))  BASIC METABOLIC PANEL     Status: None   Collection Time    11/26/12  9:26 AM      Result Value Range  Sodium 142  135 - 145 mEq/L   Potassium 3.6  3.5 - 5.1 mEq/L   Chloride 105  96 - 112 mEq/L   CO2 26  19 - 32 mEq/L   Glucose, Bld 83  70 - 99 mg/dL   BUN 13  6 - 23 mg/dL   Creatinine, Ser 4.09  0.50 - 1.10 mg/dL   Calcium 9.5  8.4 - 81.1 mg/dL   GFR calc non Af Amer >90  >90 mL/min   GFR calc Af Amer >90  >90 mL/min  CBC     Status: None   Collection Time    11/26/12  9:26 AM      Result Value Range   WBC 7.3  4.0 - 10.5 K/uL   RBC 4.21  3.87 - 5.11 MIL/uL   Hemoglobin 12.5  12.0 - 15.0 g/dL   HCT 91.4  78.2 - 95.6 %   MCV 88.8  78.0 - 100.0 fL   MCH 29.7  26.0 - 34.0 pg   MCHC 33.4  30.0 - 36.0 g/dL   RDW 21.3  08.6 - 57.8 %   Platelets 383  150 - 400 K/uL  HCG, SERUM, QUALITATIVE     Status: None   Collection Time    11/26/12  9:26 AM      Result Value Range   Preg, Serum NEGATIVE  NEGATIVE   Radiology Results: Dg Chest 2 View  11/26/2012  *RADIOLOGY REPORT*  Clinical Data: Preoperative evaluation; asthma and hypertension  CHEST - 2 VIEW   Comparison: October 27, 2011  Findings:  Lungs clear.  Heart size and pulmonary vascularity are normal.  No adenopathy.  No bone lesions.  Impression:  No abnormality noted.   Original Report Authenticated By: Bretta Bang, M.D.    General appearance: alert, cooperative and morbidly obese Head: Normocephalic, without obvious abnormality, atraumatic Eyes: negative Ears: normal TM's and external ear canals both ears Nose: Nares normal. Septum midline. Mucosa normal. No drainage or sinus tenderness. Throat: Severe dental caries teeth #'s 11, 12, 29, 30 Neck: no adenopathy, supple, symmetrical, trachea midline and thyroid not enlarged, symmetric, no tenderness/mass/nodules Resp: clear to auscultation bilaterally Cardio: regular rate and rhythm, S1, S2 normal, no murmur, click, rub or gallop  Assessment:36 WF Morbid obesity, HTN, Panic Attacks, Severe dental phobia with non-restorable teeth #'s 11, 12, 29, 30.  Plan:Extract teeth #'s 11, 12, 29, 30. General anesthesia. Day surgery.   Georgia Lopes 11/26/2012

## 2012-11-26 NOTE — Progress Notes (Signed)
11/26/12 0942  OBSTRUCTIVE SLEEP APNEA  Score 4 or greater  Results sent to PCP   This patient has screened at an elevated risk for obstructive sleep apnea using the STOP Bang tool during a pre-surgical visit. A score of 4 or greater is an elevated risk for sleep apnea

## 2012-11-26 NOTE — Progress Notes (Signed)
Patient denied having a stress test, sleep study, or cardiac cath. Patient informed Nurse that she takes Atenolol at bedtime. Nurse instructed patient that she did not need to take it the morning of surgery as long as she took it at bedtime. Patient verbalized understanding. Revonda Standard, [place] informed of patients past anesthesia history.

## 2012-11-26 NOTE — Anesthesia Preprocedure Evaluation (Addendum)
Anesthesia Evaluation  Patient identified by MRN, date of birth, ID band Patient awake    History of Anesthesia Complications (+) PONV and DIFFICULT IV STICK / SPECIAL LINE  Airway Mallampati: I TM Distance: >3 FB Neck ROM: full    Dental   Pulmonary asthma ,          Cardiovascular hypertension, Rhythm:regular Rate:Normal     Neuro/Psych  Headaches, Anxiety Depression  Neuromuscular disease    GI/Hepatic GERD-  ,  Endo/Other  Morbid obesity  Renal/GU Renal disease     Musculoskeletal   Abdominal   Peds  Hematology   Anesthesia Other Findings   Reproductive/Obstetrics                          Anesthesia Physical Anesthesia Plan  ASA: III  Anesthesia Plan: General   Post-op Pain Management:    Induction: Intravenous  Airway Management Planned: Oral ETT  Additional Equipment:   Intra-op Plan:   Post-operative Plan: Extubation in OR  Informed Consent: I have reviewed the patients History and Physical, chart, labs and discussed the procedure including the risks, benefits and alternatives for the proposed anesthesia with the patient or authorized representative who has indicated his/her understanding and acceptance.     Plan Discussed with: CRNA, Anesthesiologist and Surgeon  Anesthesia Plan Comments:         Anesthesia Quick Evaluation

## 2012-11-26 NOTE — Pre-Procedure Instructions (Signed)
RAIYAH SPEAKMAN  11/26/2012   Your procedure is scheduled on: Monday, November 30, 2012  Report to [facility] 3rd floor at 5:30 AM.  Call this number if you have problems the morning of surgery: [phone]   Remember:   Do not eat food or drink liquids after midnight.   Take these medicines the morning of surgery with A SIP OF WATER:    if needed: albuterol (PROVENTIL HFA;VENTOLIN HFA) 108 (90 BASE) MCG/ACT inhaler, Xopenex Nebulizer if needed Hydrocodone-Acetaminophen 5-300 MG TABS  tiZANidine (ZANAFLEX) 4 MG tablet if needed                 Do not wear jewelry, make-up or nail polish.  Do not wear lotions, powders, or perfumes.   Do not shave 48 hours prior to surgery.   Do not bring valuables to the hospital.  Contacts, dentures or bridgework may not be worn into surgery.  Leave suitcase in the car. After surgery it may be brought to your room.  For patients admitted to the hospital, checkout time is 11:00 AM the day of discharge.   Patients discharged the day of surgery will not be allowed to drive home.  Name and phone number of your driver:   Special Instructions: Shower using CHG 2 nights before surgery and the night before surgery.  If you shower the day of surgery use CHG.  Use special wash - you have one bottle of CHG for all showers.  You should use approximately 1/3 of the bottle for each shower.   Please read over the following fact sheets that you were given: Pain Booklet, Coughing and Deep Breathing and Surgical Site Infection Prevention

## 2012-11-29 MED ORDER — VANCOMYCIN HCL 10 G IV SOLR
1500.0000 mg | INTRAVENOUS | Status: AC
  Filled 2012-11-29 (×2): qty 1500

## 2012-11-30 ENCOUNTER — Ambulatory Visit (HOSPITAL_COMMUNITY)
Admission: RE | Admit: 2012-11-30 | Discharge: 2012-11-30 | Disposition: A | Discharge status: Home / Self Care | Payer: Medicaid Other | Source: Ambulatory Visit | Attending: Oral Surgery | Admitting: Oral Surgery

## 2012-11-30 ENCOUNTER — Encounter (HOSPITAL_COMMUNITY): Payer: Self-pay | Admitting: Vascular Surgery

## 2012-11-30 ENCOUNTER — Encounter (HOSPITAL_COMMUNITY)
Admission: RE | Disposition: A | Discharge status: Home / Self Care | Payer: Self-pay | Source: Ambulatory Visit | Attending: Oral Surgery

## 2012-11-30 ENCOUNTER — Encounter (HOSPITAL_COMMUNITY): Payer: Self-pay | Admitting: *Deleted

## 2012-11-30 ENCOUNTER — Ambulatory Visit (HOSPITAL_COMMUNITY): Payer: Medicaid Other | Admitting: Anesthesiology

## 2012-11-30 DIAGNOSIS — D1800 Hemangioma unspecified site: Secondary | ICD-10-CM

## 2012-11-30 DIAGNOSIS — Z882 Allergy status to sulfonamides status: Secondary | ICD-10-CM | POA: Insufficient documentation

## 2012-11-30 DIAGNOSIS — K029 Dental caries, unspecified: Secondary | ICD-10-CM | POA: Insufficient documentation

## 2012-11-30 DIAGNOSIS — R319 Hematuria, unspecified: Secondary | ICD-10-CM

## 2012-11-30 DIAGNOSIS — M549 Dorsalgia, unspecified: Secondary | ICD-10-CM

## 2012-11-30 DIAGNOSIS — Z885 Allergy status to narcotic agent status: Secondary | ICD-10-CM | POA: Insufficient documentation

## 2012-11-30 DIAGNOSIS — J309 Allergic rhinitis, unspecified: Secondary | ICD-10-CM

## 2012-11-30 DIAGNOSIS — K8020 Calculus of gallbladder without cholecystitis without obstruction: Secondary | ICD-10-CM

## 2012-11-30 DIAGNOSIS — J45909 Unspecified asthma, uncomplicated: Secondary | ICD-10-CM | POA: Insufficient documentation

## 2012-11-30 DIAGNOSIS — Z85820 Personal history of malignant melanoma of skin: Secondary | ICD-10-CM | POA: Insufficient documentation

## 2012-11-30 DIAGNOSIS — E669 Obesity, unspecified: Secondary | ICD-10-CM | POA: Insufficient documentation

## 2012-11-30 DIAGNOSIS — F429 Obsessive-compulsive disorder, unspecified: Secondary | ICD-10-CM

## 2012-11-30 DIAGNOSIS — Z8742 Personal history of other diseases of the female genital tract: Secondary | ICD-10-CM

## 2012-11-30 DIAGNOSIS — I10 Essential (primary) hypertension: Secondary | ICD-10-CM | POA: Insufficient documentation

## 2012-11-30 DIAGNOSIS — Z880 Allergy status to penicillin: Secondary | ICD-10-CM | POA: Insufficient documentation

## 2012-11-30 DIAGNOSIS — F410 Panic disorder [episodic paroxysmal anxiety] without agoraphobia: Secondary | ICD-10-CM | POA: Insufficient documentation

## 2012-11-30 DIAGNOSIS — J019 Acute sinusitis, unspecified: Secondary | ICD-10-CM

## 2012-11-30 DIAGNOSIS — J189 Pneumonia, unspecified organism: Secondary | ICD-10-CM

## 2012-11-30 DIAGNOSIS — G4700 Insomnia, unspecified: Secondary | ICD-10-CM | POA: Insufficient documentation

## 2012-11-30 DIAGNOSIS — N83209 Unspecified ovarian cyst, unspecified side: Secondary | ICD-10-CM

## 2012-11-30 DIAGNOSIS — F319 Bipolar disorder, unspecified: Secondary | ICD-10-CM

## 2012-11-30 DIAGNOSIS — G43909 Migraine, unspecified, not intractable, without status migrainosus: Secondary | ICD-10-CM | POA: Insufficient documentation

## 2012-11-30 DIAGNOSIS — K219 Gastro-esophageal reflux disease without esophagitis: Secondary | ICD-10-CM | POA: Insufficient documentation

## 2012-11-30 DIAGNOSIS — Z79899 Other long term (current) drug therapy: Secondary | ICD-10-CM | POA: Insufficient documentation

## 2012-11-30 DIAGNOSIS — F40298 Other specified phobia: Secondary | ICD-10-CM | POA: Insufficient documentation

## 2012-11-30 DIAGNOSIS — Z8719 Personal history of other diseases of the digestive system: Secondary | ICD-10-CM

## 2012-11-30 HISTORY — PX: MULTIPLE EXTRACTIONS WITH ALVEOLOPLASTY: SHX5342

## 2012-11-30 SURGERY — MULTIPLE EXTRACTION WITH ALVEOLOPLASTY
Anesthesia: General | Site: Mouth | Laterality: Bilateral | Wound class: Clean Contaminated

## 2012-11-30 MED ORDER — LIDOCAINE-EPINEPHRINE 2 %-1:100000 IJ SOLN
INTRAMUSCULAR | Status: AC
  Filled 2012-11-30: qty 1

## 2012-11-30 MED ORDER — LIDOCAINE HCL (CARDIAC) 20 MG/ML IV SOLN
INTRAVENOUS | Status: DC | PRN
  Administered 2012-11-30: 100 mg via INTRAVENOUS

## 2012-11-30 MED ORDER — FENTANYL CITRATE 0.05 MG/ML IJ SOLN
INTRAMUSCULAR | Status: DC | PRN
  Administered 2012-11-30: 100 ug via INTRAVENOUS

## 2012-11-30 MED ORDER — SODIUM CHLORIDE 0.9 % IR SOLN
Status: DC | PRN
  Administered 2012-11-30: 1

## 2012-11-30 MED ORDER — VANCOMYCIN HCL 1000 MG IV SOLR
1000.0000 mg | INTRAVENOUS | Status: DC | PRN
  Administered 2012-11-30: 1500 mg via INTRAVENOUS

## 2012-11-30 MED ORDER — LACTATED RINGERS IV SOLN
INTRAVENOUS | Status: DC | PRN
  Administered 2012-11-30: 07:00:00 via INTRAVENOUS

## 2012-11-30 MED ORDER — HYDROMORPHONE HCL PF 1 MG/ML IJ SOLN
INTRAMUSCULAR | Status: AC
  Administered 2012-11-30: 0.5 mg via INTRAVENOUS
  Filled 2012-11-30: qty 1

## 2012-11-30 MED ORDER — HYDROMORPHONE HCL PF 1 MG/ML IJ SOLN
0.2500 mg | INTRAMUSCULAR | Status: DC | PRN
  Administered 2012-11-30: 0.5 mg via INTRAVENOUS

## 2012-11-30 MED ORDER — MIDAZOLAM HCL 5 MG/5ML IJ SOLN
INTRAMUSCULAR | Status: DC | PRN
  Administered 2012-11-30: 2 mg via INTRAVENOUS

## 2012-11-30 MED ORDER — LIDOCAINE-EPINEPHRINE 2 %-1:100000 IJ SOLN
INTRAMUSCULAR | Status: DC | PRN
  Administered 2012-11-30: 20 mL

## 2012-11-30 MED ORDER — ONDANSETRON HCL 4 MG/2ML IJ SOLN
INTRAMUSCULAR | Status: AC
  Filled 2012-11-30: qty 2

## 2012-11-30 MED ORDER — ONDANSETRON HCL 4 MG/2ML IJ SOLN
4.0000 mg | Freq: Once | INTRAMUSCULAR | Status: AC | PRN
  Administered 2012-11-30: 4 mg via INTRAVENOUS

## 2012-11-30 MED ORDER — OXYMETAZOLINE HCL 0.05 % NA SOLN
NASAL | Status: AC
  Filled 2012-11-30: qty 15

## 2012-11-30 MED ORDER — PROPOFOL 10 MG/ML IV BOLUS
INTRAVENOUS | Status: DC | PRN
  Administered 2012-11-30: 300 mg via INTRAVENOUS

## 2012-11-30 MED ORDER — OXYCODONE-ACETAMINOPHEN 5-325 MG PO TABS: 1.0000 | ORAL_TABLET | ORAL | Status: DC | PRN

## 2012-11-30 MED ORDER — ONDANSETRON HCL 4 MG/2ML IJ SOLN
INTRAMUSCULAR | Status: DC | PRN
  Administered 2012-11-30: 4 mg via INTRAVENOUS

## 2012-11-30 MED ORDER — SUCCINYLCHOLINE CHLORIDE 20 MG/ML IJ SOLN
INTRAMUSCULAR | Status: DC | PRN
  Administered 2012-11-30: 140 mg via INTRAVENOUS

## 2012-11-30 MED ORDER — HYDROMORPHONE HCL PF 1 MG/ML IJ SOLN
INTRAMUSCULAR | Status: AC
  Filled 2012-11-30: qty 1

## 2012-11-30 SURGICAL SUPPLY — 27 items
BUR CROSS CUT FISSURE 1.6 (BURR) ×2 IMPLANT
BUR EGG ELITE 4.0 (BURR) IMPLANT
CANISTER SUCTION 2500CC (MISCELLANEOUS) ×2 IMPLANT
CLOTH BEACON ORANGE TIMEOUT ST (SAFETY) ×2 IMPLANT
COVER SURGICAL LIGHT HANDLE (MISCELLANEOUS) ×2 IMPLANT
CRADLE DONUT ADULT HEAD (MISCELLANEOUS) ×2 IMPLANT
DECANTER SPIKE VIAL GLASS SM (MISCELLANEOUS) ×2 IMPLANT
GAUZE PACKING FOLDED 2  STR (GAUZE/BANDAGES/DRESSINGS) ×1
GAUZE PACKING FOLDED 2 STR (GAUZE/BANDAGES/DRESSINGS) ×1 IMPLANT
GLOVE BIO SURGEON STRL SZ 6.5 (GLOVE) ×2 IMPLANT
GLOVE BIO SURGEON STRL SZ7.5 (GLOVE) ×2 IMPLANT
GLOVE BIOGEL PI IND STRL 7.0 (GLOVE) ×1 IMPLANT
GLOVE BIOGEL PI INDICATOR 7.0 (GLOVE) ×1
GOWN STRL NON-REIN LRG LVL3 (GOWN DISPOSABLE) ×2 IMPLANT
GOWN STRL REIN XL XLG (GOWN DISPOSABLE) ×2 IMPLANT
KIT BASIN OR (CUSTOM PROCEDURE TRAY) ×2 IMPLANT
KIT ROOM TURNOVER OR (KITS) ×2 IMPLANT
NEEDLE 22X1 1/2 (OR ONLY) (NEEDLE) ×2 IMPLANT
NS IRRIG 1000ML POUR BTL (IV SOLUTION) ×2 IMPLANT
PAD ARMBOARD 7.5X6 YLW CONV (MISCELLANEOUS) ×4 IMPLANT
SUT CHROMIC 3 0 PS 2 (SUTURE) ×2 IMPLANT
SYR CONTROL 10ML LL (SYRINGE) ×2 IMPLANT
TOWEL OR 17X26 10 PK STRL BLUE (TOWEL DISPOSABLE) ×2 IMPLANT
TRAY ENT MC OR (CUSTOM PROCEDURE TRAY) ×2 IMPLANT
TUBING IRRIGATION (MISCELLANEOUS) ×2 IMPLANT
WATER STERILE IRR 1000ML POUR (IV SOLUTION) IMPLANT
YANKAUER SUCT BULB TIP NO VENT (SUCTIONS) ×2 IMPLANT

## 2012-11-30 NOTE — Anesthesia Postprocedure Evaluation (Signed)
  Anesthesia Post-op Note  Patient: Grace Fisher  Procedure(s) Performed: Procedure(s): MULTIPLE EXTRACTION  (Bilateral)  Patient Location: PACU  Anesthesia Type:General  Level of Consciousness: awake, alert , oriented and patient cooperative  Airway and Oxygen Therapy: Patient Spontanous Breathing  Post-op Pain: mild  Post-op Assessment: Post-op Vital signs reviewed, Patient's Cardiovascular Status Stable, Respiratory Function Stable, Patent Airway, No signs of Nausea or vomiting and Pain level controlled  Post-op Vital Signs: stable  Complications: No apparent anesthesia complications

## 2012-11-30 NOTE — Op Note (Signed)
11/30/2012  8:15 AM  PATIENT:  Grace Fisher  37 y.o. female  PRE-OPERATIVE DIAGNOSIS:  NONRESTORABLE TEETH #'s 11, 12, 29, 30; SEVERE DENTAL PHOBIA  POST-OPERATIVE DIAGNOSIS:  SAME  PROCEDURE:  Procedure(s): MULTIPLE EXTRACTION TEETH #'S 11, 12, 29, 30  SURGEON:  Surgeon(s): [place] Lopes, DDS  ANESTHESIA:   local and general  EBL:  minimal  DRAINS: none   SPECIMEN:  No Specimen  COUNTS:  YES  PLAN OF CARE: Discharge to home after PACU  PATIENT DISPOSITION:  PACU - hemodynamically stable.   PROCEDURE DETAILS: Dictation #[id number]  Georgia Lopes, DMD 11/30/2012 8:15 AM

## 2012-11-30 NOTE — Preoperative (Signed)
Beta Blockers   Reason not to administer Beta Blockers:took atenolol last night

## 2012-11-30 NOTE — H&P (Signed)
H&P documentation  -History and Physical Reviewed  -Patient has been re-examined  -No change in the plan of care  Grace Fisher M  

## 2012-11-30 NOTE — Transfer of Care (Signed)
Immediate Anesthesia Transfer of Care Note  Patient: Grace Fisher  Procedure(s) Performed: Procedure(s): MULTIPLE EXTRACTION  (Bilateral)  Patient Location: PACU  Anesthesia Type:General  Level of Consciousness: awake, alert  and oriented  Airway & Oxygen Therapy: Patient Spontanous Breathing and Patient connected to nasal cannula oxygen  Post-op Assessment: Report given to PACU RN and Post -op Vital signs reviewed and stable  Post vital signs: Reviewed and stable  Complications: No apparent anesthesia complications

## 2012-12-01 ENCOUNTER — Encounter (HOSPITAL_COMMUNITY): Payer: Self-pay | Admitting: Oral Surgery

## 2012-12-01 NOTE — Op Note (Signed)
NAMEJERRIAH, Grace Fisher             ACCOUNT NO.:  [id number]  MEDICAL RECORD NO.:  [id number]  LOCATION:  [facility]                         FACILITY:  [facility]  PHYSICIAN:  Georgia Lopes, M.D.  DATE OF BIRTH:  1976-01-01  DATE OF PROCEDURE:  11/30/2012 DATE OF DISCHARGE:  11/30/2012                              OPERATIVE REPORT   PREOPERATIVE DIAGNOSIS:  Nonrestorable teeth numbers 11, 12, 29, and 30; severe dental phobia.  POSTOPERATIVE DIAGNOSIS:  Nonrestorable teeth numbers 11, 12, 29, and 30; severe dental phobia.  PROCEDURE:  Extraction of teeth numbers 11, 12, 29, and 30.  INDICATIONS FOR PROCEDURE:  [name] is a 37 year old female with a medical history of asthma, migraines, acid reflux, morbid obesity, renal stones, hypertension, anxiety, severe dental phobia, panic attacks, and depression, who is referred to me for removal of 4 grossly decayed teeth.  The patient refused to have the procedure done using local anesthesia, so general anesthesia was advised, and the patient was scheduled for hospital surgery for airway protection during the procedure due to medical complications.  PROCEDURE IN DETAIL:  The patient was taken to the operating room, placed on the table in supine position.  General anesthesia was administered intravenously, and an oral endotracheal tube was placed and marked.  The eyes were protected.  The patient was draped for the procedure.  A timeout was performed.  Then, the posterior pharynx was suctioned.  The Yankauer was suction.  Throat pack was placed.  A 2% lidocaine with 1:100,000 epinephrine was infiltrated around teeth numbers 11, 12 buccally and palatally and then in an inferior alveolar block on the right side.  A total of 8 mL was used of anesthetic solution.  A bite block was placed in the mouth, and a sweetheart retractor was used to help retract the tongue.  The 15 blade was used to make a full-thickness incision around teeth numbers 11 and 12.   Bone was removed with a Striker handpiece under irrigation.  Then, the teeth were elevated and removed with the upper universal forceps.  The sockets were curetted and then irrigated and closed with 3-0 chromic.  The bite block and sweetheart retractor were placed on the other side of the mouth after repositioning the endotracheal tube to the left side of the mouth. The 15 blade was then used to make a full-thickness incision around teeth numbers 29 and 30.  The periosteum was reflected, bone was removed with the handpiece.  The teeth were elevated and then removed.  The tooth #30 was removed with the #17 forceps and tooth #29 was removed with the Asch forceps.  The sockets were then curetted, irrigated, and closed with 3-0 chromic.  The oral cavity was irrigated and suctioned. Throat pack was removed.  The patient awakened and taken to the recovery room, breathing spontaneously in good condition.  ESTIMATED BLOOD LOSS:  Minimal.  COMPLICATIONS:  None.  SPECIMENS:  None.     Georgia Lopes, M.D.     SMJ/MEDQ  D:  11/30/2012  T:  12/01/2012  Job:  [id number]

## 2013-05-06 ENCOUNTER — Other Ambulatory Visit: Payer: Self-pay

## 2013-09-14 ENCOUNTER — Encounter (HOSPITAL_COMMUNITY): Payer: Self-pay | Admitting: Emergency Medicine

## 2013-09-14 ENCOUNTER — Emergency Department (HOSPITAL_COMMUNITY)
Admission: EM | Admit: 2013-09-14 | Discharge: 2013-09-15 | Disposition: A | Discharge status: Home / Self Care | Payer: Medicaid Other | Attending: Emergency Medicine | Admitting: Emergency Medicine

## 2013-09-14 DIAGNOSIS — F3289 Other specified depressive episodes: Secondary | ICD-10-CM | POA: Insufficient documentation

## 2013-09-14 DIAGNOSIS — Z881 Allergy status to other antibiotic agents status: Secondary | ICD-10-CM | POA: Insufficient documentation

## 2013-09-14 DIAGNOSIS — G4700 Insomnia, unspecified: Secondary | ICD-10-CM | POA: Insufficient documentation

## 2013-09-14 DIAGNOSIS — F411 Generalized anxiety disorder: Secondary | ICD-10-CM | POA: Insufficient documentation

## 2013-09-14 DIAGNOSIS — Z8669 Personal history of other diseases of the nervous system and sense organs: Secondary | ICD-10-CM | POA: Insufficient documentation

## 2013-09-14 DIAGNOSIS — Z79899 Other long term (current) drug therapy: Secondary | ICD-10-CM | POA: Insufficient documentation

## 2013-09-14 DIAGNOSIS — Z885 Allergy status to narcotic agent status: Secondary | ICD-10-CM | POA: Insufficient documentation

## 2013-09-14 DIAGNOSIS — Z8709 Personal history of other diseases of the respiratory system: Secondary | ICD-10-CM | POA: Insufficient documentation

## 2013-09-14 DIAGNOSIS — K219 Gastro-esophageal reflux disease without esophagitis: Secondary | ICD-10-CM | POA: Insufficient documentation

## 2013-09-14 DIAGNOSIS — K529 Noninfective gastroenteritis and colitis, unspecified: Secondary | ICD-10-CM

## 2013-09-14 DIAGNOSIS — J309 Allergic rhinitis, unspecified: Secondary | ICD-10-CM | POA: Insufficient documentation

## 2013-09-14 DIAGNOSIS — R112 Nausea with vomiting, unspecified: Secondary | ICD-10-CM | POA: Insufficient documentation

## 2013-09-14 DIAGNOSIS — K5900 Constipation, unspecified: Secondary | ICD-10-CM | POA: Insufficient documentation

## 2013-09-14 DIAGNOSIS — E669 Obesity, unspecified: Secondary | ICD-10-CM | POA: Insufficient documentation

## 2013-09-14 DIAGNOSIS — R350 Frequency of micturition: Secondary | ICD-10-CM | POA: Insufficient documentation

## 2013-09-14 DIAGNOSIS — G5600 Carpal tunnel syndrome, unspecified upper limb: Secondary | ICD-10-CM | POA: Insufficient documentation

## 2013-09-14 DIAGNOSIS — F329 Major depressive disorder, single episode, unspecified: Secondary | ICD-10-CM | POA: Insufficient documentation

## 2013-09-14 DIAGNOSIS — IMO0002 Reserved for concepts with insufficient information to code with codable children: Secondary | ICD-10-CM | POA: Insufficient documentation

## 2013-09-14 DIAGNOSIS — Z880 Allergy status to penicillin: Secondary | ICD-10-CM | POA: Insufficient documentation

## 2013-09-14 DIAGNOSIS — K5289 Other specified noninfective gastroenteritis and colitis: Secondary | ICD-10-CM | POA: Insufficient documentation

## 2013-09-14 DIAGNOSIS — Z8489 Family history of other specified conditions: Secondary | ICD-10-CM | POA: Insufficient documentation

## 2013-09-14 DIAGNOSIS — Z87442 Personal history of urinary calculi: Secondary | ICD-10-CM | POA: Insufficient documentation

## 2013-09-14 DIAGNOSIS — I10 Essential (primary) hypertension: Secondary | ICD-10-CM | POA: Insufficient documentation

## 2013-09-14 DIAGNOSIS — F489 Nonpsychotic mental disorder, unspecified: Secondary | ICD-10-CM | POA: Insufficient documentation

## 2013-09-14 DIAGNOSIS — R4589 Other symptoms and signs involving emotional state: Secondary | ICD-10-CM | POA: Insufficient documentation

## 2013-09-14 DIAGNOSIS — J45909 Unspecified asthma, uncomplicated: Secondary | ICD-10-CM | POA: Insufficient documentation

## 2013-09-14 LAB — CBC WITH DIFFERENTIAL/PLATELET
BASOS ABS: 0 10*3/uL (ref 0.0–0.1)
Basophils Relative: 0 % (ref 0–1)
EOS ABS: 0 10*3/uL (ref 0.0–0.7)
EOS PCT: 0 % (ref 0–5)
HCT: 41.1 % (ref 36.0–46.0)
Hemoglobin: 13.7 g/dL (ref 12.0–15.0)
LYMPHS ABS: 0.7 10*3/uL (ref 0.7–4.0)
LYMPHS PCT: 6 % — AB (ref 12–46)
MCH: 29.7 pg (ref 26.0–34.0)
MCHC: 33.3 g/dL (ref 30.0–36.0)
MCV: 89.2 fL (ref 78.0–100.0)
Monocytes Absolute: 0.6 10*3/uL (ref 0.1–1.0)
Monocytes Relative: 5 % (ref 3–12)
NEUTROS PCT: 89 % — AB (ref 43–77)
Neutro Abs: 10.7 10*3/uL — ABNORMAL HIGH (ref 1.7–7.7)
PLATELETS: 320 10*3/uL (ref 150–400)
RBC: 4.61 MIL/uL (ref 3.87–5.11)
RDW: 14.7 % (ref 11.5–15.5)
WBC: 12 10*3/uL — AB (ref 4.0–10.5)

## 2013-09-14 LAB — COMPREHENSIVE METABOLIC PANEL
ALK PHOS: 75 U/L (ref 39–117)
ALT: 12 U/L (ref 0–35)
AST: 15 U/L (ref 0–37)
Albumin: 4.3 g/dL (ref 3.5–5.2)
BUN: 14 mg/dL (ref 6–23)
CALCIUM: 9.9 mg/dL (ref 8.4–10.5)
CO2: 23 meq/L (ref 19–32)
Chloride: 99 mEq/L (ref 96–112)
Creatinine, Ser: 0.69 mg/dL (ref 0.50–1.10)
GFR calc non Af Amer: >90 mL/min (ref >90)
GLUCOSE: 137 mg/dL — AB (ref 70–99)
POTASSIUM: 3.6 meq/L — AB (ref 3.7–5.3)
SODIUM: 141 meq/L (ref 137–147)
TOTAL PROTEIN: 7.9 g/dL (ref 6.0–8.3)
Total Bilirubin: 0.7 mg/dL (ref 0.3–1.2)

## 2013-09-14 MED ORDER — HYDROMORPHONE HCL PF 1 MG/ML IJ SOLN
1.0000 mg | Freq: Once | INTRAMUSCULAR | Status: AC
  Administered 2013-09-14: 0.3 mg via INTRAVENOUS
  Filled 2013-09-14: qty 1

## 2013-09-14 MED ORDER — ONDANSETRON HCL 4 MG/2ML IJ SOLN
4.0000 mg | Freq: Once | INTRAMUSCULAR | Status: AC
  Administered 2013-09-14: 4 mg via INTRAVENOUS
  Filled 2013-09-14: qty 2

## 2013-09-14 MED ORDER — SODIUM CHLORIDE 0.9 % IV BOLUS (SEPSIS)
1000.0000 mL | Freq: Once | INTRAVENOUS | Status: AC
  Administered 2013-09-14: 1000 mL via INTRAVENOUS

## 2013-09-14 NOTE — ED Provider Notes (Signed)
CSN: [id number]     Arrival date & time 09/14/13  2118 History   First MD Initiated Contact with Patient 09/14/13 2315     Chief Complaint  Patient presents with  . Emesis  . Diarrhea     (Consider location/radiation/quality/duration/timing/severity/associated sxs/prior Treatment) HPI Comments: Patient states she started with vomiting and diarrhea.  2 days ago, which was mild in nature.  Her children have had the same thing, so she just thought she caught a virus, but her diarrhea has become severe.  Difference in Prilosec.  She is having abdominal cramping, decreased urinary output, nausea, vomiting, she's tried taking Phenergan, without any relief She denies recently being on any antibiotics  Patient is a 38 y.o. female presenting with vomiting and diarrhea. The history is provided by the patient.  Emesis Severity:  Severe Duration:  2 days Number of daily episodes:  Multiple Quality:  Bilious material Progression:  Worsening Chronicity:  New Recent urination:  Decreased Relieved by:  None tried Worsened by:  Food smell and liquids Ineffective treatments:  Antiemetics Associated symptoms: abdominal pain and diarrhea   Associated symptoms: no chills, no fever and no headaches   Abdominal pain:    Location:  Periumbilical   Quality:  Aching and cramping   Severity:  Severe   Onset quality:  Gradual   Duration:  2 days   Timing:  Constant   Progression:  Partially resolved   Chronicity:  New Diarrhea:    Quality:  Unusually odiferous and watery   Number of occurrences:  Multiple   Severity:  Severe   Duration:  2 days   Timing:  Intermittent   Progression:  Worsening Risk factors: sick contacts   Risk factors: no diabetes, not pregnant now and no prior abdominal surgery   Diarrhea Associated symptoms: abdominal pain and vomiting   Associated symptoms: no chills, no fever and no headaches     Past Medical History  Diagnosis Date  . Asthma   . Migraines   . Acid  reflux   . Obesity   . Renal stones t  . Family history of anesthesia complication     Son also has difficult time breathing after anesthesia  . Hypertension   . Shortness of breath   . Anxiety   . Mental disorder   . Depression   . Panic attack as reaction to stress   . Constipation, chronic   . Bronchitis 11/2012  . Urinary frequency     difficulty emptying bladder completely  . Melanoma     on back and arms  . Seasonal allergies   . Insomnia   . Bilateral carpal tunnel syndrome   . Complication of anesthesia     Had post-op fever (100 - 101) and SOB after anesthesia  . PONV (postoperative nausea and vomiting)    Past Surgical History  Procedure Laterality Date  . Cholecystectomy    . Multiple extractions with alveoloplasty Bilateral 11/30/2012    Procedure: MULTIPLE EXTRACTION ;  Surgeon: Gae Bon, DDS;  Location: [place];  Service: Oral Surgery;  Laterality: Bilateral;   Family History  Problem Relation Age of Onset  . Coronary artery disease Father    History  Substance Use Topics  . Smoking status: Never Smoker   . Smokeless tobacco: Not on file  . Alcohol Use: No   OB History   Grav Para Term Preterm Abortions TAB SAB Ect Mult Living  Review of Systems  Constitutional: Negative for fever and chills.  Respiratory: Negative for shortness of breath.   Gastrointestinal: Positive for nausea, vomiting, abdominal pain and diarrhea. Negative for constipation and blood in stool.  Genitourinary: Positive for decreased urine volume. Negative for dysuria and frequency.  Skin: Negative for pallor and rash.  Neurological: Negative for dizziness and headaches.  All other systems reviewed and are negative.      Allergies  Acetaminophen-codeine; Morphine; Penicillins; and Sulfa antibiotics  Home Medications   Current Outpatient Rx  Name  Route  Sig  Dispense  Refill  . albuterol (PROVENTIL HFA;VENTOLIN HFA) 108 (90 BASE) MCG/ACT inhaler    Inhalation   Inhale 2 puffs into the lungs every 6 (six) hours as needed for shortness of breath.          Marland Kitchen atenolol (TENORMIN) 50 MG tablet   Oral   Take 25 mg by mouth daily.         Marland Kitchen gabapentin (NEURONTIN) 300 MG capsule   Oral   Take 300 mg by mouth at bedtime.         . levalbuterol (XOPENEX) 0.63 MG/3ML nebulizer solution   Nebulization   Take 1 ampule by nebulization every 4 (four) hours as needed for shortness of breath.         . loratadine (CLARITIN) 10 MG tablet   Oral   Take 10 mg by mouth daily.         . montelukast (SINGULAIR) 10 MG tablet   Oral   Take 10 mg by mouth at bedtime.           . promethazine (PHENERGAN) 6.25 MG/5ML syrup   Oral   Take 6.25 mg by mouth 3 (three) times daily as needed (for nausea associated with migraines).         . ranitidine (ZANTAC) 150 MG tablet   Oral   Take 150 mg by mouth at bedtime.         Marland Kitchen tiZANidine (ZANAFLEX) 4 MG tablet   Oral   Take 4 mg by mouth every 8 (eight) hours as needed (for migraines).          . zolpidem (AMBIEN) 10 MG tablet   Oral   Take 10 mg by mouth at bedtime.         . clindamycin (CLEOCIN) 300 MG capsule   Oral   Take 300 mg by mouth daily.         . ondansetron (ZOFRAN ODT) 4 MG disintegrating tablet   Oral   Take 1 tablet (4 mg total) by mouth every 8 (eight) hours as needed for nausea or vomiting.   20 tablet   0    BP 132/78  Pulse 77  Temp(Src) 98.3 F (36.8 C) (Oral)  Resp 18  SpO2 98%  LMP 08/19/2013 Physical Exam  Nursing note and vitals reviewed. Constitutional: She is oriented to person, place, and time. She appears well-developed and well-nourished.  HENT:  Head: Normocephalic.  Mouth/Throat: Oropharynx is clear and moist.  Eyes: Pupils are equal, round, and reactive to light.  Neck: Normal range of motion.  Cardiovascular: Normal rate and regular rhythm.   Pulmonary/Chest: Effort normal and breath sounds normal.  Abdominal: Soft. She  exhibits no distension. There is tenderness.  Abdominal pain, difficulty to body habitus  Musculoskeletal: Normal range of motion. She exhibits no edema and no tenderness.  Lymphadenopathy:    She has no cervical adenopathy.  Neurological: She is alert  and oriented to person, place, and time.  Skin: Skin is warm and dry. There is pallor.    ED Course  Procedures (including critical care time) Labs Review Labs Reviewed  CBC WITH DIFFERENTIAL - Abnormal; Notable for the following:    WBC 12.0 (*)    Neutrophils Relative % 89 (*)    Neutro Abs 10.7 (*)    Lymphocytes Relative 6 (*)    All other components within normal limits  COMPREHENSIVE METABOLIC PANEL - Abnormal; Notable for the following:    Potassium 3.6 (*)    Glucose, Bld 137 (*)    All other components within normal limits  URINALYSIS, ROUTINE W REFLEX MICROSCOPIC - Abnormal; Notable for the following:    APPearance CLOUDY (*)    Ketones, ur 15 (*)    All other components within normal limits  CLOSTRIDIUM DIFFICILE BY PCR  URINALYSIS, ROUTINE W REFLEX MICROSCOPIC  POCT PREGNANCY, URINE   Imaging Review No results found.  EKG Interpretation   None       MDM   Final diagnoses:  Gastroenteritis   Patient has received IV fluids, IV Zofran she had one large, bowel movement.  On initial presentation to the emergency department, and 2 very tiny ones shortly thereafter, but none, since.  She's not had any more episodes of vomiting.  She has been tolerating fluids by mouth.  She was discharged home with prescription for Zofran.  Diet recommendations for diarrhea.  Follow up with her primary care physician as needed      Garald Balding, NP 09/15/13 0455  Garald Balding, NP 09/15/13 [phone]

## 2013-09-14 NOTE — ED Notes (Signed)
PA at BS.  

## 2013-09-14 NOTE — ED Notes (Signed)
Pt. reports nausea , vomitting and diarrhea onset this afternoon , denies fever /chills or body aches.

## 2013-09-15 LAB — URINALYSIS, ROUTINE W REFLEX MICROSCOPIC
Bilirubin Urine: NEGATIVE
GLUCOSE, UA: NEGATIVE mg/dL
HGB URINE DIPSTICK: NEGATIVE
Ketones, ur: 15 mg/dL — AB
LEUKOCYTES UA: NEGATIVE
Nitrite: NEGATIVE
PH: 7.5 (ref 5.0–8.0)
Protein, ur: NEGATIVE mg/dL
SPECIFIC GRAVITY, URINE: 1.026 (ref 1.005–1.030)
Urobilinogen, UA: 0.2 mg/dL (ref 0.0–1.0)

## 2013-09-15 LAB — CLOSTRIDIUM DIFFICILE BY PCR: Toxigenic C. Difficile by PCR: NEGATIVE

## 2013-09-15 LAB — POCT PREGNANCY, URINE: Preg Test, Ur: NEGATIVE

## 2013-09-15 MED ORDER — ONDANSETRON 4 MG PO TBDP: 4.0000 mg | ORAL_TABLET | Freq: Three times a day (TID) | ORAL | Status: DC | PRN

## 2013-09-15 MED ORDER — ONDANSETRON HCL 4 MG/2ML IJ SOLN
4.0000 mg | Freq: Once | INTRAMUSCULAR | Status: AC
  Administered 2013-09-15: 4 mg via INTRAVENOUS
  Filled 2013-09-15: qty 2

## 2013-09-15 MED ORDER — KETOROLAC TROMETHAMINE 30 MG/ML IJ SOLN
30.0000 mg | Freq: Once | INTRAMUSCULAR | Status: AC
  Administered 2013-09-15: 30 mg via INTRAVENOUS
  Filled 2013-09-15: qty 1

## 2013-09-15 MED ORDER — DIPHENHYDRAMINE HCL 50 MG/ML IJ SOLN
12.5000 mg | Freq: Once | INTRAMUSCULAR | Status: AC
  Administered 2013-09-15: 12.5 mg via INTRAVENOUS
  Filled 2013-09-15: qty 1

## 2013-09-15 NOTE — Discharge Instructions (Signed)
Diet for Diarrhea, Adult Frequent, runny stools (diarrhea) may be caused or worsened by food or drink. Diarrhea may be relieved by changing your diet. Since diarrhea can last up to 7 days, it is easy for you to lose too much fluid from the body and become dehydrated. Fluids that are lost need to be replaced. Along with a modified diet, make sure you drink enough fluids to keep your urine clear or pale yellow. DIET INSTRUCTIONS  Ensure adequate fluid intake (hydration): have 1 cup (8 oz) of fluid for each diarrhea episode. Avoid fluids that contain simple sugars or sports drinks, fruit juices, whole milk products, and sodas. Your urine should be clear or pale yellow if you are drinking enough fluids. Hydrate with an oral rehydration solution that you can purchase at pharmacies, retail stores, and online. You can prepare an oral rehydration solution at home by mixing the following ingredients together:    tsp table salt.   tsp baking soda.   tsp salt substitute containing potassium chloride.  1  tablespoons sugar.  1 L (34 oz) of water.  Certain foods and beverages may increase the speed at which food moves through the gastrointestinal (GI) tract. These foods and beverages should be avoided and include:  Caffeinated and alcoholic beverages.  High-fiber foods, such as raw fruits and vegetables, nuts, seeds, and whole grain breads and cereals.  Foods and beverages sweetened with sugar alcohols, such as xylitol, sorbitol, and mannitol.  Some foods may be well tolerated and may help thicken stool including:  Starchy foods, such as rice, toast, pasta, low-sugar cereal, oatmeal, grits, baked potatoes, crackers, and bagels.   Bananas.   Applesauce.  Add probiotic-rich foods to help increase healthy bacteria in the GI tract, such as yogurt and fermented milk products. RECOMMENDED FOODS AND BEVERAGES Starches Choose foods with less than 2 g of fiber per serving.  Recommended:  White,  [place], and pita breads, plain rolls, buns, bagels. Plain muffins, matzo. Soda, saltine, or graham crackers. Pretzels, melba toast, zwieback. Cooked cereals made with water: cornmeal, farina, cream cereals. Dry cereals: refined corn, wheat, rice. Potatoes prepared any way without skins, refined macaroni, spaghetti, noodles, refined rice.  Avoid:  Bread, rolls, or crackers made with whole wheat, multi-grains, rye, bran seeds, nuts, or coconut. Corn tortillas or taco shells. Cereals containing whole grains, multi-grains, bran, coconut, nuts, raisins. Cooked or dry oatmeal. Coarse wheat cereals, granola. Cereals advertised as "high-fiber." Potato skins. Whole grain pasta, wild or brown rice. Popcorn. Sweet potatoes, yams. Sweet rolls, doughnuts, waffles, pancakes, sweet breads. Vegetables  Recommended: Strained tomato and vegetable juices. Most well-cooked and canned vegetables without seeds. Fresh: Tender lettuce, cucumber without the skin, cabbage, spinach, bean sprouts.  Avoid: Fresh, cooked, or canned: Artichokes, baked beans, beet greens, broccoli, Brussels sprouts, corn, kale, legumes, peas, sweet potatoes. Cooked: Green or red cabbage, spinach. Avoid large servings of any vegetables because vegetables shrink when cooked, and they contain more fiber per serving than fresh vegetables. Fruit  Recommended: Cooked or canned: Apricots, applesauce, cantaloupe, cherries, fruit cocktail, grapefruit, grapes, kiwi, mandarin oranges, peaches, pears, plums, watermelon. Fresh: Apples without skin, ripe banana, grapes, cantaloupe, cherries, grapefruit, peaches, oranges, plums. Keep servings limited to  cup or 1 piece.  Avoid: Fresh: Apples with skin, apricots, mangoes, pears, raspberries, strawberries. Prune juice, stewed or dried prunes. Dried fruits, raisins, dates. Large servings of all fresh fruits. Protein  Recommended: Ground or well-cooked tender beef, ham, veal, lamb, pork, or poultry. Eggs. Fish,  oysters, shrimp,  lobster, other seafoods. Liver, organ meats.  Avoid: Tough, fibrous meats with gristle. Peanut butter, smooth or chunky. Cheese, nuts, seeds, legumes, dried peas, beans, lentils. Dairy  Recommended: Yogurt, lactose-free milk, kefir, drinkable yogurt, buttermilk, soy milk, or plain hard cheese.  Avoid: Milk, chocolate milk, beverages made with milk, such as milkshakes. Soups  Recommended: Bouillon, broth, or soups made from allowed foods. Any strained soup.  Avoid: Soups made from vegetables that are not allowed, cream or milk-based soups. Desserts and Sweets  Recommended: Sugar-free gelatin, sugar-free frozen ice pops made without sugar alcohol.  Avoid: Plain cakes and cookies, pie made with fruit, pudding, custard, cream pie. Gelatin, fruit, ice, sherbet, frozen ice pops. Ice cream, ice milk without nuts. Plain hard candy, honey, jelly, molasses, syrup, sugar, chocolate syrup, gumdrops, marshmallows. Fats and Oils  Recommended: Limit fats to less than 8 tsp per day.  Avoid: Seeds, nuts, olives, avocados. Margarine, butter, cream, mayonnaise, salad oils, plain salad dressings. Plain gravy, crisp bacon without rind. Beverages  Recommended: Water, decaffeinated teas, oral rehydration solutions, sugar-free beverages not sweetened with sugar alcohols.  Avoid: Fruit juices, caffeinated beverages (coffee, tea, soda), alcohol, sports drinks, or lemon-lime soda. Condiments  Recommended: Ketchup, mustard, horseradish, vinegar, cocoa powder. Spices in moderation: allspice, basil, bay leaves, celery powder or leaves, cinnamon, cumin powder, curry powder, ginger, mace, marjoram, onion or garlic powder, oregano, paprika, parsley flakes, ground pepper, rosemary, sage, savory, tarragon, thyme, turmeric.  Avoid: Coconut, honey. Document Released: 10/12/2003 Document Revised: 04/15/2012 Document Reviewed: 12/06/2011 [facility] Patient Information 2014 [place]. Tried to eat  very lightly for the next 48 hours.  May sure to drink plenty of fluids.  You have been given a prescription for Zofran to help control any episodes of nausea.  Please followup with your primary care physician as needed

## 2013-09-15 NOTE — ED Notes (Signed)
Pt reports she is very nauseous. NP informed.

## 2013-09-15 NOTE — ED Notes (Signed)
Pt given a sprite 

## 2013-09-15 NOTE — ED Provider Notes (Signed)
Medical screening examination/treatment/procedure(s) were performed by non-physician practitioner and as supervising physician I was immediately available for consultation/collaboration.    Delora Fuel, MD 02/33/43 [id number]

## 2013-09-15 NOTE — ED Notes (Addendum)
NP informed of pt's request for pain medication, not as strong as dilaudid as requested by pt.

## 2013-09-15 NOTE — ED Notes (Signed)
Pt reports the back of her left upper leg is burning, feels like it is on fire.

## 2013-09-15 NOTE — ED Notes (Signed)
See paper charting from [id number].

## 2013-09-15 NOTE — ED Notes (Signed)
Pt tolerated fluids well.  

## 2013-09-15 NOTE — ED Notes (Signed)
Pt's husband bringing her clothes to change into. Pt still in room awaiting clothes.

## 2013-12-31 ENCOUNTER — Emergency Department (HOSPITAL_COMMUNITY)
Admission: EM | Admit: 2013-12-31 | Discharge: 2013-12-31 | Disposition: A | Discharge status: Home / Self Care | Payer: Medicaid Other | Attending: Emergency Medicine | Admitting: Emergency Medicine

## 2013-12-31 ENCOUNTER — Emergency Department (HOSPITAL_COMMUNITY): Payer: Medicaid Other

## 2013-12-31 ENCOUNTER — Encounter (HOSPITAL_COMMUNITY): Payer: Self-pay | Admitting: Emergency Medicine

## 2013-12-31 DIAGNOSIS — S8001XA Contusion of right knee, initial encounter: Secondary | ICD-10-CM

## 2013-12-31 DIAGNOSIS — E669 Obesity, unspecified: Secondary | ICD-10-CM | POA: Insufficient documentation

## 2013-12-31 DIAGNOSIS — IMO0002 Reserved for concepts with insufficient information to code with codable children: Secondary | ICD-10-CM | POA: Insufficient documentation

## 2013-12-31 DIAGNOSIS — M25461 Effusion, right knee: Secondary | ICD-10-CM

## 2013-12-31 DIAGNOSIS — J45909 Unspecified asthma, uncomplicated: Secondary | ICD-10-CM | POA: Insufficient documentation

## 2013-12-31 DIAGNOSIS — W19XXXA Unspecified fall, initial encounter: Secondary | ICD-10-CM

## 2013-12-31 DIAGNOSIS — I10 Essential (primary) hypertension: Secondary | ICD-10-CM | POA: Insufficient documentation

## 2013-12-31 DIAGNOSIS — Z87442 Personal history of urinary calculi: Secondary | ICD-10-CM | POA: Insufficient documentation

## 2013-12-31 DIAGNOSIS — Z85820 Personal history of malignant melanoma of skin: Secondary | ICD-10-CM | POA: Insufficient documentation

## 2013-12-31 DIAGNOSIS — S8000XA Contusion of unspecified knee, initial encounter: Secondary | ICD-10-CM | POA: Insufficient documentation

## 2013-12-31 DIAGNOSIS — Z791 Long term (current) use of non-steroidal anti-inflammatories (NSAID): Secondary | ICD-10-CM | POA: Insufficient documentation

## 2013-12-31 DIAGNOSIS — R296 Repeated falls: Secondary | ICD-10-CM | POA: Insufficient documentation

## 2013-12-31 DIAGNOSIS — Z8659 Personal history of other mental and behavioral disorders: Secondary | ICD-10-CM | POA: Insufficient documentation

## 2013-12-31 DIAGNOSIS — Z880 Allergy status to penicillin: Secondary | ICD-10-CM | POA: Insufficient documentation

## 2013-12-31 DIAGNOSIS — G4700 Insomnia, unspecified: Secondary | ICD-10-CM | POA: Insufficient documentation

## 2013-12-31 DIAGNOSIS — Z79899 Other long term (current) drug therapy: Secondary | ICD-10-CM | POA: Insufficient documentation

## 2013-12-31 DIAGNOSIS — Y929 Unspecified place or not applicable: Secondary | ICD-10-CM | POA: Insufficient documentation

## 2013-12-31 DIAGNOSIS — K219 Gastro-esophageal reflux disease without esophagitis: Secondary | ICD-10-CM | POA: Insufficient documentation

## 2013-12-31 DIAGNOSIS — Y939 Activity, unspecified: Secondary | ICD-10-CM | POA: Insufficient documentation

## 2013-12-31 MED ORDER — IBUPROFEN 400 MG PO TABS
800.0000 mg | ORAL_TABLET | Freq: Once | ORAL | Status: AC
  Administered 2013-12-31: 800 mg via ORAL
  Filled 2013-12-31: qty 2

## 2013-12-31 MED ORDER — MELOXICAM 7.5 MG PO TABS: 15.0000 mg | ORAL_TABLET | Freq: Every day | ORAL | Status: DC

## 2013-12-31 MED ORDER — OXYCODONE-ACETAMINOPHEN 5-325 MG PO TABS
2.0000 | ORAL_TABLET | Freq: Once | ORAL | Status: AC
  Administered 2013-12-31: 2 via ORAL
  Filled 2013-12-31: qty 2

## 2013-12-31 MED ORDER — OXYCODONE-ACETAMINOPHEN 5-325 MG PO TABS: 1.0000 | ORAL_TABLET | Freq: Four times a day (QID) | ORAL | Status: DC | PRN
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
## 2013-12-31 NOTE — ED Provider Notes (Signed)
History/physical exam/procedure(s) were performed by non-physician practitioner and as supervising physician I was immediately available for consultation/collaboration. I have reviewed all notes and am in agreement with care and plan.   Shaune Pollack, MD 12/31/13 [phone]

## 2015-09-28 ENCOUNTER — Emergency Department (HOSPITAL_COMMUNITY)
Admission: EM | Admit: 2015-09-28 | Discharge: 2015-09-28 | Disposition: A | Discharge status: Home / Self Care | Payer: Medicaid Other | Attending: Emergency Medicine | Admitting: Emergency Medicine

## 2015-09-28 ENCOUNTER — Encounter (HOSPITAL_COMMUNITY): Payer: Self-pay | Admitting: Emergency Medicine

## 2015-09-28 DIAGNOSIS — J45909 Unspecified asthma, uncomplicated: Secondary | ICD-10-CM | POA: Diagnosis not present

## 2015-09-28 DIAGNOSIS — E669 Obesity, unspecified: Secondary | ICD-10-CM | POA: Insufficient documentation

## 2015-09-28 DIAGNOSIS — I10 Essential (primary) hypertension: Secondary | ICD-10-CM | POA: Diagnosis not present

## 2015-09-28 DIAGNOSIS — R109 Unspecified abdominal pain: Secondary | ICD-10-CM | POA: Diagnosis present

## 2015-09-28 NOTE — ED Notes (Signed)
Attempted to initiate ultrasound IV. Once needle punctured skin, pt told RN to stop due to pain. Pt told RN that RN was not to continue with venipuncture. RN removed angiocath prior to obtaining blood.

## 2015-09-28 NOTE — ED Notes (Signed)
Called again  No response from lobby  

## 2015-09-28 NOTE — ED Notes (Signed)
Pt c/o left flank pain radiating to front lower abdomen with intermittent left flank pain onset 1 hour ago, states she is prone to renal calculi and ovarian cysts, pt currently on antibiotics for UTI and BV.

## 2015-09-28 NOTE — ED Notes (Signed)
Called to take to room  No response from lobby  

## 2015-09-29 ENCOUNTER — Emergency Department (HOSPITAL_COMMUNITY): Payer: Medicaid Other | Admitting: Anesthesiology

## 2015-09-29 ENCOUNTER — Emergency Department (HOSPITAL_COMMUNITY)
Admission: EM | Admit: 2015-09-29 | Discharge: 2015-09-29 | Disposition: A | Discharge status: Home / Self Care | Payer: Medicaid Other | Attending: Urology | Admitting: Urology

## 2015-09-29 ENCOUNTER — Encounter (HOSPITAL_COMMUNITY): Admission: EM | Disposition: A | Discharge status: Home / Self Care | Payer: Self-pay | Source: Home / Self Care

## 2015-09-29 ENCOUNTER — Encounter (HOSPITAL_COMMUNITY): Payer: Self-pay | Admitting: *Deleted

## 2015-09-29 ENCOUNTER — Emergency Department (HOSPITAL_COMMUNITY): Payer: Medicaid Other

## 2015-09-29 DIAGNOSIS — Z85820 Personal history of malignant melanoma of skin: Secondary | ICD-10-CM | POA: Insufficient documentation

## 2015-09-29 DIAGNOSIS — Z79899 Other long term (current) drug therapy: Secondary | ICD-10-CM | POA: Insufficient documentation

## 2015-09-29 DIAGNOSIS — K219 Gastro-esophageal reflux disease without esophagitis: Secondary | ICD-10-CM | POA: Insufficient documentation

## 2015-09-29 DIAGNOSIS — Z791 Long term (current) use of non-steroidal anti-inflammatories (NSAID): Secondary | ICD-10-CM | POA: Diagnosis not present

## 2015-09-29 DIAGNOSIS — F419 Anxiety disorder, unspecified: Secondary | ICD-10-CM | POA: Diagnosis not present

## 2015-09-29 DIAGNOSIS — N390 Urinary tract infection, site not specified: Secondary | ICD-10-CM | POA: Insufficient documentation

## 2015-09-29 DIAGNOSIS — N136 Pyonephrosis: Secondary | ICD-10-CM | POA: Diagnosis present

## 2015-09-29 DIAGNOSIS — B964 Proteus (mirabilis) (morganii) as the cause of diseases classified elsewhere: Secondary | ICD-10-CM | POA: Diagnosis not present

## 2015-09-29 DIAGNOSIS — I10 Essential (primary) hypertension: Secondary | ICD-10-CM | POA: Diagnosis not present

## 2015-09-29 DIAGNOSIS — Z9049 Acquired absence of other specified parts of digestive tract: Secondary | ICD-10-CM | POA: Diagnosis not present

## 2015-09-29 DIAGNOSIS — Z87442 Personal history of urinary calculi: Secondary | ICD-10-CM | POA: Insufficient documentation

## 2015-09-29 DIAGNOSIS — N139 Obstructive and reflux uropathy, unspecified: Secondary | ICD-10-CM

## 2015-09-29 DIAGNOSIS — F329 Major depressive disorder, single episode, unspecified: Secondary | ICD-10-CM | POA: Diagnosis not present

## 2015-09-29 DIAGNOSIS — Z6841 Body Mass Index (BMI) 40.0 and over, adult: Secondary | ICD-10-CM | POA: Diagnosis not present

## 2015-09-29 DIAGNOSIS — J45909 Unspecified asthma, uncomplicated: Secondary | ICD-10-CM | POA: Diagnosis not present

## 2015-09-29 HISTORY — PX: CYSTOSCOPY WITH STENT PLACEMENT: SHX5790

## 2015-09-29 LAB — URINALYSIS, ROUTINE W REFLEX MICROSCOPIC
BILIRUBIN URINE: NEGATIVE
Glucose, UA: NEGATIVE mg/dL
Ketones, ur: NEGATIVE mg/dL
NITRITE: NEGATIVE
PROTEIN: 30 mg/dL — AB
Specific Gravity, Urine: 1.034 — ABNORMAL HIGH (ref 1.005–1.030)
pH: 5.5 (ref 5.0–8.0)

## 2015-09-29 LAB — URINE MICROSCOPIC-ADD ON

## 2015-09-29 LAB — COMPREHENSIVE METABOLIC PANEL
ALBUMIN: 3.7 g/dL (ref 3.5–5.0)
ALT: 18 U/L (ref 14–54)
ANION GAP: 12 (ref 5–15)
AST: 23 U/L (ref 15–41)
Alkaline Phosphatase: 50 U/L (ref 38–126)
BUN: 16 mg/dL (ref 6–20)
CALCIUM: 9.6 mg/dL (ref 8.9–10.3)
CO2: 23 mmol/L (ref 22–32)
CREATININE: 0.79 mg/dL (ref 0.44–1.00)
Chloride: 103 mmol/L (ref 101–111)
GFR calc Af Amer: >60 mL/min (ref >60)
GFR calc non Af Amer: >60 mL/min (ref >60)
Glucose, Bld: 127 mg/dL — ABNORMAL HIGH (ref 65–99)
Potassium: 4 mmol/L (ref 3.5–5.1)
SODIUM: 138 mmol/L (ref 135–145)
Total Protein: 6.4 g/dL — ABNORMAL LOW (ref 6.5–8.1)

## 2015-09-29 LAB — CBC
HCT: 40 % (ref 36.0–46.0)
Hemoglobin: 12.7 g/dL (ref 12.0–15.0)
MCH: 29.4 pg (ref 26.0–34.0)
MCHC: 31.8 g/dL (ref 30.0–36.0)
MCV: 92.6 fL (ref 78.0–100.0)
PLATELETS: 345 10*3/uL (ref 150–400)
RBC: 4.32 MIL/uL (ref 3.87–5.11)
RDW: 14.3 % (ref 11.5–15.5)
WBC: 7.6 10*3/uL (ref 4.0–10.5)

## 2015-09-29 LAB — LIPASE, BLOOD: Lipase: 38 U/L (ref 11–51)

## 2015-09-29 LAB — POC URINE PREG, ED: Preg Test, Ur: NEGATIVE

## 2015-09-29 SURGERY — CYSTOSCOPY, WITH STENT INSERTION
Anesthesia: General | Site: Ureter | Laterality: Left

## 2015-09-29 MED ORDER — FLUCONAZOLE 100 MG PO TABS
200.0000 mg | ORAL_TABLET | Freq: Once | ORAL | Status: AC
  Administered 2015-09-29: 200 mg via ORAL
  Filled 2015-09-29: qty 2

## 2015-09-29 MED ORDER — DEXAMETHASONE SODIUM PHOSPHATE 10 MG/ML IJ SOLN
INTRAMUSCULAR | Status: DC | PRN
  Administered 2015-09-29: 10 mg via INTRAVENOUS

## 2015-09-29 MED ORDER — SODIUM CHLORIDE 0.9 % IV BOLUS (SEPSIS)
1000.0000 mL | Freq: Once | INTRAVENOUS | Status: AC
  Administered 2015-09-29: 1000 mL via INTRAVENOUS

## 2015-09-29 MED ORDER — MIDAZOLAM HCL 5 MG/5ML IJ SOLN
INTRAMUSCULAR | Status: DC | PRN
  Administered 2015-09-29: 2 mg via INTRAVENOUS

## 2015-09-29 MED ORDER — FENTANYL CITRATE (PF) 100 MCG/2ML IJ SOLN
INTRAMUSCULAR | Status: DC | PRN
  Administered 2015-09-29: 50 ug via INTRAVENOUS

## 2015-09-29 MED ORDER — CIPROFLOXACIN HCL 250 MG PO TABS: 500.0000 mg | ORAL_TABLET | Freq: Two times a day (BID) | ORAL | Status: DC

## 2015-09-29 MED ORDER — KETOROLAC TROMETHAMINE 30 MG/ML IJ SOLN
30.0000 mg | Freq: Once | INTRAMUSCULAR | Status: AC
  Administered 2015-09-29: 30 mg via INTRAVENOUS
  Filled 2015-09-29: qty 1

## 2015-09-29 MED ORDER — MIDAZOLAM HCL 2 MG/2ML IJ SOLN
INTRAMUSCULAR | Status: AC
  Filled 2015-09-29: qty 2

## 2015-09-29 MED ORDER — PROPOFOL 10 MG/ML IV BOLUS
INTRAVENOUS | Status: AC
  Filled 2015-09-29: qty 20

## 2015-09-29 MED ORDER — FENTANYL CITRATE (PF) 100 MCG/2ML IJ SOLN
INTRAMUSCULAR | Status: AC
  Administered 2015-09-29: 50 ug
  Filled 2015-09-29: qty 2

## 2015-09-29 MED ORDER — DEXTROSE 5 % IV SOLN
1.0000 g | Freq: Once | INTRAVENOUS | Status: AC
  Administered 2015-09-29: 1 g via INTRAVENOUS
  Filled 2015-09-29: qty 10

## 2015-09-29 MED ORDER — LACTATED RINGERS IV SOLN
INTRAVENOUS | Status: DC | PRN
  Administered 2015-09-29: 18:00:00 via INTRAVENOUS

## 2015-09-29 MED ORDER — HYDROCODONE-ACETAMINOPHEN 5-325 MG PO TABS: 1.0000 | ORAL_TABLET | Freq: Four times a day (QID) | ORAL | Status: DC | PRN

## 2015-09-29 MED ORDER — SODIUM CHLORIDE 0.9 % IR SOLN
Status: DC | PRN
  Administered 2015-09-29: 2000 mL

## 2015-09-29 MED ORDER — HYDROMORPHONE HCL 1 MG/ML IJ SOLN
0.5000 mg | Freq: Once | INTRAMUSCULAR | Status: AC
  Administered 2015-09-29: 0.5 mg via INTRAVENOUS
  Filled 2015-09-29: qty 1

## 2015-09-29 MED ORDER — PROPOFOL 10 MG/ML IV BOLUS
INTRAVENOUS | Status: DC | PRN
  Administered 2015-09-29: 200 mg via INTRAVENOUS

## 2015-09-29 MED ORDER — LIDOCAINE HCL (CARDIAC) 20 MG/ML IV SOLN
INTRAVENOUS | Status: DC | PRN
  Administered 2015-09-29: 100 mg via INTRAVENOUS

## 2015-09-29 MED ORDER — FENTANYL CITRATE (PF) 100 MCG/2ML IJ SOLN
INTRAMUSCULAR | Status: AC
  Filled 2015-09-29: qty 2

## 2015-09-29 MED ORDER — HYDROMORPHONE HCL 1 MG/ML IJ SOLN
1.0000 mg | Freq: Once | INTRAMUSCULAR | Status: AC
  Administered 2015-09-29: 1 mg via INTRAVENOUS
  Filled 2015-09-29: qty 1

## 2015-09-29 MED ORDER — OXYCODONE-ACETAMINOPHEN 5-325 MG PO TABS
1.0000 | ORAL_TABLET | Freq: Once | ORAL | Status: AC
Start: 2015-09-29 — End: 2015-09-29
  Administered 2015-09-29: 1 via ORAL
  Filled 2015-09-29: qty 1

## 2015-09-29 MED ORDER — DEXAMETHASONE SODIUM PHOSPHATE 10 MG/ML IJ SOLN
INTRAMUSCULAR | Status: AC
  Filled 2015-09-29: qty 1

## 2015-09-29 MED ORDER — ONDANSETRON HCL 4 MG/2ML IJ SOLN
INTRAMUSCULAR | Status: AC
  Filled 2015-09-29: qty 2

## 2015-09-29 MED ORDER — ONDANSETRON HCL 4 MG/2ML IJ SOLN
INTRAMUSCULAR | Status: DC | PRN
  Administered 2015-09-29: 4 mg via INTRAVENOUS

## 2015-09-29 MED ORDER — ONDANSETRON 4 MG PO TBDP
8.0000 mg | ORAL_TABLET | Freq: Once | ORAL | Status: AC
  Administered 2015-09-29: 8 mg via ORAL
  Filled 2015-09-29: qty 2

## 2015-09-29 MED ORDER — LIDOCAINE HCL (CARDIAC) 20 MG/ML IV SOLN
INTRAVENOUS | Status: AC
  Filled 2015-09-29: qty 5

## 2015-09-29 SURGICAL SUPPLY — 10 items
BAG URO CATCHER STRL LF (MISCELLANEOUS) ×3 IMPLANT
CATH INTERMIT  6FR 70CM (CATHETERS) ×3 IMPLANT
CLOTH BEACON ORANGE TIMEOUT ST (SAFETY) ×3 IMPLANT
GLOVE BIOGEL M STRL SZ7.5 (GLOVE) ×3 IMPLANT
GOWN STRL REUS W/TWL LRG LVL3 (GOWN DISPOSABLE) ×6 IMPLANT
GUIDEWIRE STR DUAL SENSOR (WIRE) ×3 IMPLANT
MANIFOLD NEPTUNE II (INSTRUMENTS) ×3 IMPLANT
PACK CYSTO (CUSTOM PROCEDURE TRAY) ×3 IMPLANT
TUBING CONNECTING 10 (TUBING) ×2 IMPLANT
TUBING CONNECTING 10' (TUBING) ×1

## 2015-09-29 NOTE — ED Notes (Signed)
She was checked at [facility] long earlier today

## 2015-09-29 NOTE — ED Notes (Signed)
Pt return from CT.

## 2015-09-29 NOTE — ED Provider Notes (Signed)
CSN: [id number]     Arrival date & time 09/29/15  0350 History   First MD Initiated Contact with Patient 09/29/15 [phone]     Chief Complaint  Patient presents with  . Flank Pain     (Consider location/radiation/quality/duration/timing/severity/associated sxs/prior Treatment) HPI Comments: The patient is a 40 year old female, she has a history of kidney stones and urinary infections, recently she has been treated with both Macrobid as well as ciprofloxacin for what appears to be urinary tract type infections. She is also recently had bacterial vaginosis, she states that she gets ovarian cysts and has frequent kidney stones. She reports that her pain came on yesterday, it was fast in onset, it has been sharp in nature, mostly in the lower back radiating to the lower pelvis, she does report some blood in her urine. The symptoms are persistent, she is very uncomfortable at this time., Nothing makes it better or worse.  Patient is a 40 y.o. female presenting with flank pain. The history is provided by the patient.  Flank Pain    Past Medical History  Diagnosis Date  . Asthma   . Migraines   . Acid reflux   . Obesity   . Renal stones t  . Family history of anesthesia complication     Son also has difficult time breathing after anesthesia  . Hypertension   . Shortness of breath   . Anxiety   . Mental disorder   . Depression   . Panic attack as reaction to stress   . Constipation, chronic   . Bronchitis 11/2012  . Urinary frequency     difficulty emptying bladder completely  . Melanoma ([place])     on back and arms  . Seasonal allergies   . Insomnia   . Bilateral carpal tunnel syndrome   . Complication of anesthesia     Had post-op fever (100 - 101) and SOB after anesthesia  . PONV (postoperative nausea and vomiting)    Past Surgical History  Procedure Laterality Date  . Cholecystectomy    . Multiple extractions with alveoloplasty Bilateral 11/30/2012    Procedure: MULTIPLE  EXTRACTION ;  Surgeon: Gae Bon, DDS;  Location: [place];  Service: Oral Surgery;  Laterality: Bilateral;   Family History  Problem Relation Age of Onset  . Coronary artery disease Father    Social History  Substance Use Topics  . Smoking status: Never Smoker   . Smokeless tobacco: None  . Alcohol Use: No   OB History    No data available     Review of Systems  Genitourinary: Positive for flank pain.  All other systems reviewed and are negative.     Allergies  Acetaminophen-codeine; Morphine; Penicillins; and Sulfa antibiotics  Home Medications   Prior to Admission medications   Medication Sig Start Date End Date Taking? Authorizing Provider  albuterol (PROVENTIL HFA;VENTOLIN HFA) 108 (90 BASE) MCG/ACT inhaler Inhale 2 puffs into the lungs every 6 (six) hours as needed for shortness of breath.    Yes Historical Provider, MD  ALPRAZolam Duanne Moron) 0.5 MG tablet Take 0.5 mg by mouth daily as needed. 08/14/15  Yes Historical Provider, MD  amitriptyline (ELAVIL) 25 MG tablet Take 25 mg by mouth daily. 05/26/15  Yes Historical Provider, MD  atenolol (TENORMIN) 25 MG tablet Take 25 mg by mouth daily. 09/02/15  Yes Historical Provider, MD  DULoxetine (CYMBALTA) 60 MG capsule Take 60 mg by mouth daily. 08/14/15  Yes Historical Provider, MD  ibuprofen (ADVIL,MOTRIN) 200  MG tablet Take 200 mg by mouth every 6 (six) hours as needed for mild pain.   Yes Historical Provider, MD  loratadine (CLARITIN) 10 MG tablet Take 10 mg by mouth daily.   Yes Historical Provider, MD  montelukast (SINGULAIR) 10 MG tablet Take 10 mg by mouth at bedtime.     Yes Historical Provider, MD  ranitidine (ZANTAC) 150 MG tablet Take 150 mg by mouth at bedtime.   Yes Historical Provider, MD  zolpidem (AMBIEN) 10 MG tablet Take 10 mg by mouth at bedtime.   Yes Historical Provider, MD  meloxicam (MOBIC) 7.5 MG tablet Take 2 tablets (15 mg total) by mouth daily. Patient not taking: Reported on 09/29/2015 12/31/13   Noland Fordyce, PA-C  oxyCODONE-acetaminophen (PERCOCET/ROXICET) 5-325 MG per tablet Take 1-2 tablets by mouth every 6 (six) hours as needed for moderate pain or severe pain. Patient not taking: Reported on 09/29/2015 12/31/13   Noland Fordyce, PA-C   BP 106/88 mmHg  Pulse 78  Temp(Src) 98.4 F (36.9 C)  Resp 18  Ht 5\' 3"  (1.6 m)  Wt 308 lb 4 oz (139.821 kg)  BMI 54.62 kg/m2  SpO2 95%  LMP 09/15/2015 Physical Exam  Constitutional: She appears well-developed and well-nourished. No distress.  HENT:  Head: Normocephalic and atraumatic.  Mouth/Throat: Oropharynx is clear and moist. No oropharyngeal exudate.  Eyes: Conjunctivae and EOM are normal. Pupils are equal, round, and reactive to light. Right eye exhibits no discharge. Left eye exhibits no discharge. No scleral icterus.  Neck: Normal range of motion. Neck supple. No JVD present. No thyromegaly present.  Cardiovascular: Normal rate, regular rhythm, normal heart sounds and intact distal pulses.  Exam reveals no gallop and no friction rub.   No murmur heard. Pulmonary/Chest: Effort normal and breath sounds normal. No respiratory distress. She has no wheezes. She has no rales.  Abdominal: Soft. Bowel sounds are normal. She exhibits no distension and no mass. There is tenderness ( Minimal abdominal tenderness, no focal guarding, no peritoneal signs, mild left CVA tenderness).  Musculoskeletal: Normal range of motion. She exhibits no edema or tenderness.  Lymphadenopathy:    She has no cervical adenopathy.  Neurological: She is alert. Coordination normal.  Skin: Skin is warm and dry. No rash noted. No erythema.  Psychiatric: She has a normal mood and affect. Her behavior is normal.  Nursing note and vitals reviewed.   ED Course  Procedures (including critical care time) Labs Review Labs Reviewed  COMPREHENSIVE METABOLIC PANEL - Abnormal; Notable for the following:    Glucose, Bld 127 (*)    Total Protein 6.4 (*)    All other components  within normal limits  URINALYSIS, ROUTINE W REFLEX MICROSCOPIC (NOT AT [facility]) - Abnormal; Notable for the following:    Color, Urine AMBER (*)    APPearance CLOUDY (*)    Specific Gravity, Urine 1.034 (*)    Hgb urine dipstick LARGE (*)    Protein, ur 30 (*)    Leukocytes, UA SMALL (*)    All other components within normal limits  URINE MICROSCOPIC-ADD ON - Abnormal; Notable for the following:    Squamous Epithelial / LPF 6-30 (*)    Bacteria, UA MANY (*)    Crystals CA OXALATE CRYSTALS (*)    All other components within normal limits  URINE CULTURE  LIPASE, BLOOD  CBC  POC URINE PREG, ED    Imaging Review Ct Abdomen Pelvis Wo Contrast  09/29/2015  CLINICAL DATA:  Pt c/o LEFT  flank, LLQ pain; hx of renal stones; pt was told she needed to have surgical removal of stone in 2016, but declined to do so because the pain stopped. EXAM: CT ABDOMEN AND PELVIS WITHOUT CONTRAST TECHNIQUE: Multidetector CT imaging of the abdomen and pelvis was performed following the standard protocol without IV contrast. COMPARISON:  08/21/2012 and previous FINDINGS: Lower chest:  No acute findings. Hepatobiliary: Cholecystectomy clips. No mass visualized on this un-enhanced exam. Pancreas: No mass or inflammatory process identified on this un-enhanced exam. Spleen: Within normal limits in size. Adrenals/Urinary Tract: Normal adrenal glands and right kidney. New moderate left hydronephrosis. Inflammatory/edematous changes around the left kidney and renal collecting system. 77mm obstructing left UPJ calculus. Distal ureters are decompressed. Urinary bladder is nondistended. Stomach/Bowel: No evidence of obstruction, inflammatory process, or abnormal fluid collections. Normal appendix. Vascular/Lymphatic: No pathologically enlarged lymph nodes. No evidence of abdominal aortic aneurysm. Reproductive: No mass or other significant abnormality. Other: No ascites.  No free air. Musculoskeletal:  No suspicious bone lesions  identified. IMPRESSION: 1. Obstructing 6 mm left UPJ calculus with hydronephrosis. Electronically Signed   By: Lucrezia Europe M.D.   On: 09/29/2015 09:30   I have personally reviewed and evaluated these images and lab results as part of my medical decision-making.    MDM   Final diagnoses:  Obstructive uropathy  UTI (lower urinary tract infection)    Vital signs are unremarkable, urinalysis is grossly abnormal however her lipase is normal, cooperative metabolic panel and CBC are normal. Her urinalysis reveals too numerous to count red blood cells, many bacteria, calcium oxalate crystals and yeast. CT scan ordered to rule out pyelonephritis, renal abscess or kidney stone which could become dictating this process. Pain medications ordered as below.  Discussed with Dr. Alinda Money at 11:10 AM, he will come to see the patient, discussion regarding stenting with the patient, she is resting comfortably on repeat exam at 11:15 AM.  Dr. Alinda Money has requested transfer to [facility] to the OR for stent placement  Meds given in ED:  Medications  ondansetron (ZOFRAN-ODT) disintegrating tablet 8 mg (8 mg Oral Given 09/29/15 0408)  oxyCODONE-acetaminophen (PERCOCET/ROXICET) 5-325 MG per tablet 1 tablet (1 tablet Oral Given 09/29/15 0408)  sodium chloride 0.9 % bolus 1,000 mL (0 mLs Intravenous Stopped 09/29/15 1004)  ketorolac (TORADOL) 30 MG/ML injection 30 mg (30 mg Intravenous Given 09/29/15 0736)  HYDROmorphone (DILAUDID) injection 1 mg (1 mg Intravenous Given 09/29/15 0736)  fluconazole (DIFLUCAN) tablet 200 mg (200 mg Oral Given 09/29/15 1010)  cefTRIAXone (ROCEPHIN) 1 g in dextrose 5 % 50 mL IVPB (0 g Intravenous Stopped 09/29/15 1042)  HYDROmorphone (DILAUDID) injection 0.5 mg (0.5 mg Intravenous Given 09/29/15 1255)    New Prescriptions   No medications on file        Noemi Chapel, MD 09/29/15 1550

## 2015-09-29 NOTE — ED Notes (Signed)
Pt c/o lower abd pain flank  With bloody urine  And she has been on an antibiotic for a uti  lmp feb 1st  She also has burning when she urinates

## 2015-09-29 NOTE — Op Note (Signed)
Preoperative diagnosis:  1. Left ureteral stone 2. UTI   Postoperative diagnosis:  1. Left ureteral stone 2. UTI   Procedure:  1. Cystoscopy 2. Left ureteral stent placement (6 x 24)   Surgeon: Roxy Horseman, Brooke Bonito. M.D.  Anesthesia: General  Complications: None  EBL: Minimal  Specimens: None  Indication: Grace Fisher is a 40 y.o. patient with a left ureteral stone and UTI.  Her urine culture from 09/14/15 was positive for Proteus and she has been unable to clear her infection despite appropriate antibiotics. After reviewing the management options for treatment, he elected to proceed with the above surgical procedure(s). We have discussed the potential benefits and risks of the procedure, side effects of the proposed treatment, the likelihood of the patient achieving the goals of the procedure, and any potential problems that might occur during the procedure or recuperation. Informed consent has been obtained.  Description of procedure:  The patient was taken to the operating room and general anesthesia was induced.  The patient was placed in the dorsal lithotomy position, prepped and draped in the usual sterile fashion, and preoperative antibiotics were administered. A preoperative time-out was performed.   Cystourethroscopy was performed.  The patient's urethra was examined and was normal. The bladder was then systematically examined in its entirety. There was no evidence for any bladder tumors, stones, or other mucosal pathology.    Attention then turned to the left ureteral orifice and a ureteral catheter was used to intubate the ureteral orifice.  Omnipaque contrast was injected through the ureteral catheter and a retrograde pyelogram was performed with findings as dictated above.  A 0.38 sensor guidewire was then advanced up the left ureter into the renal pelvis under fluoroscopic guidance.  The wire was then backloaded through the cystoscope and a ureteral stent was advance  over the wire using Seldinger technique.  The stent was positioned appropriately under fluoroscopic and cystoscopic guidance.  The wire was then removed with an adequate stent curl noted in the renal pelvis as well as in the bladder.  The bladder was then emptied and the procedure ended.  The patient appeared to tolerate the procedure well and without complications.  The patient was able to be awakened and transferred to the recovery unit in satisfactory condition.    Pryor Curia MD

## 2015-09-29 NOTE — Transfer of Care (Signed)
Immediate Anesthesia Transfer of Care Note  Patient: Grace Fisher  Procedure(s) Performed: Procedure(s): CYSTOSCOPY WITH STENT PLACEMENT (Left)  Patient Location: PACU  Anesthesia Type:General  Level of Consciousness: sedated  Airway & Oxygen Therapy: Patient Spontanous Breathing and Patient connected to face mask oxygen  Post-op Assessment: Report given to RN and Post -op Vital signs reviewed and stable  Post vital signs: Reviewed and stable  Last Vitals:  Filed Vitals:   09/29/15 1158 09/29/15 1643  BP: 106/88 108/68  Pulse: 78 80  Temp:  36.8 C  Resp: 18 18    Complications: No apparent anesthesia complications

## 2015-09-29 NOTE — Discharge Instructions (Signed)

## 2015-09-29 NOTE — ED Notes (Signed)
Pt resting no complaints antibiotics done

## 2015-09-29 NOTE — Anesthesia Postprocedure Evaluation (Signed)
Anesthesia Post Note  Patient: Grace Fisher  Procedure(s) Performed: Procedure(s) (LRB): CYSTOSCOPY WITH STENT PLACEMENT (Left)  Patient location during evaluation: PACU Anesthesia Type: General Level of consciousness: awake and alert and patient cooperative Pain management: pain level controlled Vital Signs Assessment: post-procedure vital signs reviewed and stable Respiratory status: spontaneous breathing and respiratory function stable Cardiovascular status: stable Anesthetic complications: no    Last Vitals:  Filed Vitals:   09/29/15 1915 09/29/15 1944  BP: 134/68   Pulse: 75   Temp: 36.4 C 36.6 C  Resp: 11     Last Pain:  Filed Vitals:   09/29/15 1945  PainSc: [place]

## 2015-09-29 NOTE — ED Provider Notes (Signed)
4:40 PM patient resting comfortably alert no distress. Stable for transfer to [place], MD 09/29/15 [phone]

## 2015-09-29 NOTE — Anesthesia Preprocedure Evaluation (Signed)
Anesthesia Evaluation  Patient identified by MRN, date of birth, ID band Patient awake    Reviewed: Allergy & Precautions, NPO status , Patient's Chart, lab work & pertinent test results  History of Anesthesia Complications (+) PONV  Airway Mallampati: II   Neck ROM: full    Dental   Pulmonary shortness of breath, asthma ,    breath sounds clear to auscultation       Cardiovascular hypertension,  Rhythm:regular Rate:Normal     Neuro/Psych  Headaches, Anxiety Depression Bipolar Disorder  Neuromuscular disease    GI/Hepatic GERD  ,  Endo/Other  Morbid obesity  Renal/GU Renal diseasestones     Musculoskeletal   Abdominal   Peds  Hematology   Anesthesia Other Findings   Reproductive/Obstetrics                             Anesthesia Physical Anesthesia Plan  ASA: II  Anesthesia Plan: General   Post-op Pain Management:    Induction: Intravenous  Airway Management Planned: LMA  Additional Equipment:   Intra-op Plan:   Post-operative Plan:   Informed Consent: I have reviewed the patients History and Physical, chart, labs and discussed the procedure including the risks, benefits and alternatives for the proposed anesthesia with the patient or authorized representative who has indicated his/her understanding and acceptance.     Plan Discussed with: CRNA, Anesthesiologist and Surgeon  Anesthesia Plan Comments:         Anesthesia Quick Evaluation

## 2015-09-29 NOTE — ED Notes (Signed)
The pt reports that she can take percocet

## 2015-09-29 NOTE — Consult Note (Signed)
Urology Consult   Physician requesting consult: Dr. Noemi Chapel   Reason for consult: UTI and ureteral stone  History of Present Illness: Grace Fisher is a 40 y.o. who developed a UTI recently and was treated by her PCP with Macrobid.  She then developed severe left flank pain with radiation to her left abdomen concerning for a possible ureteral stone.  She has a history of kidney stones.  CT imaging confirmed a 6 mm left UPJ stone.  She also has evidence of many bacteria in her urine.  She apparently had a urine culture sent by her PCP, Fulton Mole PA.  She has denied any fever but has had some nausea.   Past Medical History  Diagnosis Date  . Asthma   . Migraines   . Acid reflux   . Obesity   . Renal stones t  . Family history of anesthesia complication     Son also has difficult time breathing after anesthesia  . Hypertension   . Shortness of breath   . Anxiety   . Mental disorder   . Depression   . Panic attack as reaction to stress   . Constipation, chronic   . Bronchitis 11/2012  . Urinary frequency     difficulty emptying bladder completely  . Melanoma ([place])     on back and arms  . Seasonal allergies   . Insomnia   . Bilateral carpal tunnel syndrome   . Complication of anesthesia     Had post-op fever (100 - 101) and SOB after anesthesia  . PONV (postoperative nausea and vomiting)     Past Surgical History  Procedure Laterality Date  . Cholecystectomy    . Multiple extractions with alveoloplasty Bilateral 11/30/2012    Procedure: MULTIPLE EXTRACTION ;  Surgeon: Gae Bon, DDS;  Location: [facility];  Service: Oral Surgery;  Laterality: Bilateral;     Current Hospital Medications:  Home meds:    Medication List    ASK your doctor about these medications        albuterol 108 (90 Base) MCG/ACT inhaler  Commonly known as:  PROVENTIL HFA;VENTOLIN HFA  Inhale 2 puffs into the lungs every 6 (six) hours as needed for shortness of breath.      amitriptyline 25 MG tablet  Commonly known as:  ELAVIL  Take 25 mg by mouth daily.     atenolol 25 MG tablet  Commonly known as:  TENORMIN  Take 25 mg by mouth daily.     DULoxetine 60 MG capsule  Commonly known as:  CYMBALTA  Take 60 mg by mouth daily.     ibuprofen 200 MG tablet  Commonly known as:  ADVIL,MOTRIN  Take 200 mg by mouth every 6 (six) hours as needed for mild pain.     loratadine 10 MG tablet  Commonly known as:  CLARITIN  Take 10 mg by mouth daily.     meloxicam 7.5 MG tablet  Commonly known as:  MOBIC  Take 2 tablets (15 mg total) by mouth daily.     montelukast 10 MG tablet  Commonly known as:  SINGULAIR  Take 10 mg by mouth at bedtime.     oxyCODONE-acetaminophen 5-325 MG tablet  Commonly known as:  PERCOCET/ROXICET  Take 1-2 tablets by mouth every 6 (six) hours as needed for moderate pain or severe pain.     ranitidine 150 MG tablet  Commonly known as:  ZANTAC  Take 150 mg by mouth at bedtime.  XANAX 0.5 MG tablet  Generic drug:  ALPRAZolam  Take 0.5 mg by mouth daily as needed.     zolpidem 10 MG tablet  Commonly known as:  AMBIEN  Take 10 mg by mouth at bedtime.        Scheduled Meds: Continuous Infusions: PRN Meds:.  Allergies:  Allergies  Allergen Reactions  . Acetaminophen-Codeine Other (See Comments)    severe migraine headache  . Morphine Hives and Rash  . Penicillins Rash  . Sulfa Antibiotics Palpitations    REACTION:  fever    Family History  Problem Relation Age of Onset  . Coronary artery disease Father     Social History:  reports that she has never smoked. She does not have any smokeless tobacco history on file. She reports that she does not drink alcohol or use illicit drugs.  ROS: A complete review of systems was performed.  All systems are negative except for pertinent findings as noted.  Physical Exam:  Vital signs in last 24 hours: Temp:  [98.1 F (36.7 C)-98.4 F (36.9 C)] 98.4 F (36.9 C) (02/24  0357) Pulse Rate:  [67-94] 78 (02/24 1158) Resp:  [16-24] 18 (02/24 1158) BP: (106-139)/(50-88) 106/88 mmHg (02/24 1158) SpO2:  [90 %-100 %] 95 % (02/24 1158) Weight:  [139.821 kg (308 lb 4 oz)] 139.821 kg (308 lb 4 oz) (02/24 0357) Constitutional:  Alert and oriented, No acute distress Cardiovascular: Regular rate and rhythm, No JVD Respiratory: Normal respiratory effort, Lungs clear bilaterally GI: Abdomen is soft, nontender, nondistended, no abdominal masses GU: Moderate left CVAT Lymphatic: No lymphadenopathy Neurologic: Grossly intact, no focal deficits Psychiatric: Normal mood and affect  Laboratory Data:   Recent Labs  09/29/15 0447  WBC 7.6  HGB 12.7  HCT 40.0  PLT 345     Recent Labs  09/29/15 0447  NA 138  K 4.0  CL 103  GLUCOSE 127*  BUN 16  CALCIUM 9.6  CREATININE 0.79     Results for orders placed or performed during the hospital encounter of 09/29/15 (from the past 24 hour(s))  POC urine preg, ED (not at [facility])     Status: None   Collection Time: 09/29/15  4:24 AM  Result Value Ref Range   Preg Test, Ur NEGATIVE NEGATIVE  Urinalysis, Routine w reflex microscopic (not at [facility])     Status: Abnormal   Collection Time: 09/29/15  4:34 AM  Result Value Ref Range   Color, Urine AMBER (A) YELLOW   APPearance CLOUDY (A) CLEAR   Specific Gravity, Urine 1.034 (H) 1.005 - 1.030   pH 5.5 5.0 - 8.0   Glucose, UA NEGATIVE NEGATIVE mg/dL   Hgb urine dipstick LARGE (A) NEGATIVE   Bilirubin Urine NEGATIVE NEGATIVE   Ketones, ur NEGATIVE NEGATIVE mg/dL   Protein, ur 30 (A) NEGATIVE mg/dL   Nitrite NEGATIVE NEGATIVE   Leukocytes, UA SMALL (A) NEGATIVE  Urine microscopic-add on     Status: Abnormal   Collection Time: 09/29/15  4:34 AM  Result Value Ref Range   Squamous Epithelial / LPF 6-30 (A) NONE SEEN   WBC, UA 0-5 0 - 5 WBC/hpf   RBC / HPF TOO NUMEROUS TO COUNT 0 - 5 RBC/hpf   Bacteria, UA MANY (A) NONE SEEN   Crystals CA OXALATE CRYSTALS (A) NEGATIVE    Urine-Other YEAST PRESENT   Lipase, blood     Status: None   Collection Time: 09/29/15  4:47 AM  Result Value Ref Range   Lipase  38 11 - 51 U/L  Comprehensive metabolic panel     Status: Abnormal   Collection Time: 09/29/15  4:47 AM  Result Value Ref Range   Sodium 138 135 - 145 mmol/L   Potassium 4.0 3.5 - 5.1 mmol/L   Chloride 103 101 - 111 mmol/L   CO2 23 22 - 32 mmol/L   Glucose, Bld 127 (H) 65 - 99 mg/dL   BUN 16 6 - 20 mg/dL   Creatinine, Ser 0.79 0.44 - 1.00 mg/dL   Calcium 9.6 8.9 - 10.3 mg/dL   Total Protein 6.4 (L) 6.5 - 8.1 g/dL   Albumin 3.7 3.5 - 5.0 g/dL   AST 23 15 - 41 U/L   ALT 18 14 - 54 U/L   Alkaline Phosphatase 50 38 - 126 U/L   Total Bilirubin <1.0 0.3 - 1.2 mg/dL   GFR calc non Af Amer >60 >60 mL/min   GFR calc Af Amer >60 >60 mL/min   Anion gap 12 5 - 15  CBC     Status: None   Collection Time: 09/29/15  4:47 AM  Result Value Ref Range   WBC 7.6 4.0 - 10.5 K/uL   RBC 4.32 3.87 - 5.11 MIL/uL   Hemoglobin 12.7 12.0 - 15.0 g/dL   HCT 40.0 36.0 - 46.0 %   MCV 92.6 78.0 - 100.0 fL   MCH 29.4 26.0 - 34.0 pg   MCHC 31.8 30.0 - 36.0 g/dL   RDW 14.3 11.5 - 15.5 %   Platelets 345 150 - 400 K/uL   No results found for this or any previous visit (from the past 240 hour(s)).  Renal Function:  Recent Labs  09/29/15 0447  CREATININE 0.79   Estimated Creatinine Clearance: 127.7 mL/min (by C-G formula based on Cr of 0.79).  Radiologic Imaging: Ct Abdomen Pelvis Wo Contrast  09/29/2015  CLINICAL DATA:  Pt c/o LEFT flank, LLQ pain; hx of renal stones; pt was told she needed to have surgical removal of stone in 2016, but declined to do so because the pain stopped. EXAM: CT ABDOMEN AND PELVIS WITHOUT CONTRAST TECHNIQUE: Multidetector CT imaging of the abdomen and pelvis was performed following the standard protocol without IV contrast. COMPARISON:  08/21/2012 and previous FINDINGS: Lower chest:  No acute findings. Hepatobiliary: Cholecystectomy clips. No mass  visualized on this un-enhanced exam. Pancreas: No mass or inflammatory process identified on this un-enhanced exam. Spleen: Within normal limits in size. Adrenals/Urinary Tract: Normal adrenal glands and right kidney. New moderate left hydronephrosis. Inflammatory/edematous changes around the left kidney and renal collecting system. 68mm obstructing left UPJ calculus. Distal ureters are decompressed. Urinary bladder is nondistended. Stomach/Bowel: No evidence of obstruction, inflammatory process, or abnormal fluid collections. Normal appendix. Vascular/Lymphatic: No pathologically enlarged lymph nodes. No evidence of abdominal aortic aneurysm. Reproductive: No mass or other significant abnormality. Other: No ascites.  No free air. Musculoskeletal:  No suspicious bone lesions identified. IMPRESSION: 1. Obstructing 6 mm left UPJ calculus with hydronephrosis. Electronically Signed   By: Lucrezia Europe M.D.   On: 09/29/2015 09:30    I independently reviewed the above imaging studies.  Impression/Recommendation: Left UPJ calculus with UTI  I discussed options with the patient and recommended she proceed with cysto and left ureteral stent placement in the setting of ureteral stone and infection.  I discussed the potential benefits and risks of the procedure, side effects of the proposed treatment, the likelihood of the patient achieving the goals of the procedure, and any potential  problems that might occur during the procedure or recuperation. She agrees to proceed and gives informed consent.  She will continue antibiotic therapy and I will plan to obtain her recent culture results and her current culture will be pending.  In the absence of fever, she likely will be able to be discharged after stent placement with empiric therapy.  Dinora Hemm,LES 09/29/2015, 1:06 PM  Pryor Curia. MD   CC: Dr. Noemi Chapel

## 2015-09-30 LAB — URINE CULTURE

## 2015-10-02 ENCOUNTER — Encounter (HOSPITAL_COMMUNITY): Payer: Self-pay | Admitting: Urology

## 2015-12-01 ENCOUNTER — Other Ambulatory Visit: Payer: Self-pay | Admitting: Physician Assistant

## 2015-12-01 DIAGNOSIS — Z1231 Encounter for screening mammogram for malignant neoplasm of breast: Secondary | ICD-10-CM

## 2015-12-18 ENCOUNTER — Ambulatory Visit: Payer: Self-pay

## 2016-02-08 ENCOUNTER — Encounter: Payer: Self-pay | Admitting: Neurology

## 2016-02-08 ENCOUNTER — Ambulatory Visit (INDEPENDENT_AMBULATORY_CARE_PROVIDER_SITE_OTHER): Payer: Medicaid Other | Admitting: Neurology

## 2016-02-08 VITALS — BP 116/61 | HR 69 | Ht 63.0 in | Wt 308.6 lb

## 2016-02-08 DIAGNOSIS — R51 Headache: Secondary | ICD-10-CM | POA: Diagnosis not present

## 2016-02-08 DIAGNOSIS — G43711 Chronic migraine without aura, intractable, with status migrainosus: Secondary | ICD-10-CM | POA: Diagnosis not present

## 2016-02-08 DIAGNOSIS — H539 Unspecified visual disturbance: Secondary | ICD-10-CM

## 2016-02-08 DIAGNOSIS — H5713 Ocular pain, bilateral: Secondary | ICD-10-CM | POA: Diagnosis not present

## 2016-02-08 DIAGNOSIS — R519 Headache, unspecified: Secondary | ICD-10-CM

## 2016-02-08 DIAGNOSIS — H05119 Granuloma of unspecified orbit: Secondary | ICD-10-CM

## 2016-02-08 MED ORDER — RIZATRIPTAN BENZOATE 10 MG PO TBDP
10.0000 mg | ORAL_TABLET | ORAL | Status: DC | PRN
Start: 2016-02-08 — End: 2016-04-01

## 2016-02-08 MED ORDER — NORTRIPTYLINE HCL 10 MG PO CAPS
20.0000 mg | ORAL_CAPSULE | Freq: Every day | ORAL | Status: DC
Start: 2016-02-08 — End: 2016-04-01

## 2016-02-08 NOTE — Patient Instructions (Addendum)
Remember to drink plenty of fluid, eat healthy meals and do not skip any meals. Try to eat protein with a every meal and eat a healthy snack such as fruit or nuts in between meals. Try to keep a regular sleep-wake schedule and try to exercise daily, particularly in the form of walking, 20-30 minutes a day, if you can.   As far as your medications are concerned, I would like to suggest: Nortriptyline 10mg  for one week then 20mg  before bed. Maxalt at onset of migraine, can repeat in 2 hours, do not take more than 2 in one day  As far as diagnostic testing: MRI brain and orbits, Botox  I would like to see you back for botox, sooner if we need to. Please call us with any interim questions, concerns, problems, updates or refill requests.   Our phone number is [phone]. We also have an after hours call service for urgent matters and there is a physician on-call for urgent questions. For any emergencies you know to call 911 or go to the nearest emergency room  Nortriptyline capsules What is this medicine? NORTRIPTYLINE (nor TRIP ti leen) is used to treat depression. This medicine may be used for other purposes; ask your health care provider or pharmacist if you have questions. What should I tell my health care provider before I take this medicine? They need to know if you have any of these conditions: -an alcohol problem -bipolar disorder or schizophrenia -difficulty passing urine, prostate trouble -glaucoma -heart disease or recent heart attack -liver disease -over active thyroid -seizures -thoughts or plans of suicide or a previous suicide attempt or family history of suicide attempt -an unusual or allergic reaction to nortriptyline, other medicines, foods, dyes, or preservatives -pregnant or trying to get pregnant -breast-feeding How should I use this medicine? Take this medicine by mouth with a glass of water. Follow the directions on the prescription label. Take your doses at regular  intervals. Do not take it more often than directed. Do not stop taking this medicine suddenly except upon the advice of your doctor. Stopping this medicine too quickly may cause serious side effects or your condition may worsen. A special MedGuide will be given to you by the pharmacist with each prescription and refill. Be sure to read this information carefully each time. Talk to your pediatrician regarding the use of this medicine in children. Special care may be needed. Overdosage: If you think you have taken too much of this medicine contact a poison control center or emergency room at once. NOTE: This medicine is only for you. Do not share this medicine with others. What if I miss a dose? If you miss a dose, take it as soon as you can. If it is almost time for your next dose, take only that dose. Do not take double or extra doses. What may interact with this medicine? Do not take this medicine with any of the following medications: -arsenic trioxide -certain medicines medicines for irregular heart beat -cisapride -halofantrine -linezolid -MAOIs like Carbex, Eldepryl, Marplan, Nardil, and Parnate -methylene blue (injected into a vein) -other medicines for mental depression -phenothiazines like perphenazine, thioridazine and chlorpromazine -pimozide -probucol -procarbazine -sparfloxacin -St. John's Wort -ziprasidone This medicine may also interact with any of the following medications: -atropine and related drugs like hyoscyamine, scopolamine, tolterodine and others -barbiturate medicines for inducing sleep or treating seizures, such as phenobarbital -cimetidine -medicines for diabetes -medicines for seizures like carbamazepine or phenytoin -reserpine -thyroid medicine This list may not  describe all possible interactions. Give your health care provider a list of all the medicines, herbs, non-prescription drugs, or dietary supplements you use. Also tell them if you smoke, drink  alcohol, or use illegal drugs. Some items may interact with your medicine. What should I watch for while using this medicine? Tell your doctor if your symptoms do not get better or if they get worse. Visit your doctor or health care professional for regular checks on your progress. Because it may take several weeks to see the full effects of this medicine, it is important to continue your treatment as prescribed by your doctor. Patients and their families should watch out for new or worsening thoughts of suicide or depression. Also watch out for sudden changes in feelings such as feeling anxious, agitated, panicky, irritable, hostile, aggressive, impulsive, severely restless, overly excited and hyperactive, or not being able to sleep. If this happens, especially at the beginning of treatment or after a change in dose, call your health care professional. Dennis Bast may get drowsy or dizzy. Do not drive, use machinery, or do anything that needs mental alertness until you know how this medicine affects you. Do not stand or sit up quickly, especially if you are an older patient. This reduces the risk of dizzy or fainting spells. Alcohol may interfere with the effect of this medicine. Avoid alcoholic drinks. Do not treat yourself for coughs, colds, or allergies without asking your doctor or health care professional for advice. Some ingredients can increase possible side effects. Your mouth may get dry. Chewing sugarless gum or sucking hard candy, and drinking plenty of water may help. Contact your doctor if the problem does not go away or is severe. This medicine may cause dry eyes and blurred vision. If you wear contact lenses you may feel some discomfort. Lubricating drops may help. See your eye doctor if the problem does not go away or is severe. This medicine can cause constipation. Try to have a bowel movement at least every 2 to 3 days. If you do not have a bowel movement for 3 days, call your doctor or health  care professional. This medicine can make you more sensitive to the sun. Keep out of the sun. If you cannot avoid being in the sun, wear protective clothing and use sunscreen. Do not use sun lamps or tanning beds/booths. What side effects may I notice from receiving this medicine? Side effects that you should report to your doctor or health care professional as soon as possible: -allergic reactions like skin rash, itching or hives, swelling of the face, lips, or tongue -abnormal production of milk in females -breast enlargement in both males and females -breathing problems -confusion, hallucinations -fever with increased sweating -irregular or fast, pounding heartbeat -muscle stiffness, or spasms -pain or difficulty passing urine, loss of bladder control -seizures -suicidal thoughts or other mood changes -swelling of the testicles -tingling, pain, or numbness in the feet or hands -yellowing of the eyes or skin Side effects that usually do not require medical attention (report to your doctor or health care professional if they continue or are bothersome): -change in sex drive or performance -diarrhea -nausea, vomiting -weight gain or loss This list may not describe all possible side effects. Call your doctor for medical advice about side effects. You may report side effects to FDA at 1-800-FDA-1088. Where should I keep my medicine? Keep out of the reach of children. Store at room temperature between 15 and 30 degrees C (59 and 86 degrees F).  Keep container tightly closed. Throw away any unused medicine after the expiration date. NOTE: This sheet is a summary. It may not cover all possible information. If you have questions about this medicine, talk to your doctor, pharmacist, or health care provider.    2016, [employer]. (2011-12-09 13:57:12)  Zolmitriptan disintegrating tablets What is this medicine? ZOLMITRIPTAN (zohl mi TRIP tan) is used to treat migraines with or  without aura. An aura is a strange feeling or visual disturbance that warns you of an attack. It is not used to prevent migraines. This medicine may be used for other purposes; ask your health care provider or pharmacist if you have questions. What should I tell my health care provider before I take this medicine? They need to know if you have any of these conditions: -bowel disease, colitis or bloody diarrhea -diabetes -family history of heart disease -fast or irregular heartbeat -headaches that are different from your usual migraine -heart or blood vessel disease, angina (chest pain), or previous heart attack -high blood pressure -high cholesterol -history of stroke, transient ischemic attacks (TIAs or mini-strokes), or intracranial bleeding -kidney or liver disease -overweight -postmenopausal or surgical removal of uterus and ovaries -seizure disorder -tobacco smoker -an unusual or allergic reaction to zolmitriptan, other medicines, foods, dyes, or preservatives -pregnant or trying to get pregnant -breast-feeding How should I use this medicine? Take this medicine by mouth. Follow the directions on the prescription label. This medicine is taken at the first symptoms of a migraine. It is not for everyday use. Leave the tablet in the foil package until you are ready to take it. Do not push the tablet through the blister pack. Peel open the blister pack with dry hands and place the tablet on your tongue. The tablet will dissolve rapidly and be swallowed in your saliva. It is not necessary to drink any water to take this medicine. If your migraine headache returns after one dose, you can take another dose as directed. You must allow at least 2 hours between doses, and do not take more than 10 mg total in any 24 hour period. If there is no improvement at all after the first dose, do not take a second dose without talking to your doctor or health care professional. Do not take your medicine more  often than directed. Talk to your pediatrician regarding the use of this medicine in children. Special care may be needed. Overdosage: If you think you have taken too much of this medicine contact a poison control center or emergency room at once. NOTE: This medicine is only for you. Do not share this medicine with others. What if I miss a dose? This does not apply; this medicine is not for regular use. What may interact with this medicine? Do not take this medicine with any of the following medicines: -amphetamine or cocaine -dihydroergotamine, ergotamine, ergoloid mesylates, methysergide, or ergot-type medication - do not take within 24 hours of taking zolmitriptan -feverfew -MAOIs like Carbex, Eldepryl, Marplan, Nardil, and Parnate - do not take zolmitriptan within 2 weeks of stopping MAOI therapy -other migraine medicines like almotriptan, eletriptan, naratriptan, rizatriptan, sumatriptan - do not take within 24 hours of taking zolmitriptan -tryptophan This medicine may also interact with the following medications: -cimetidine -birth control pills -medicines for mental depression, anxiety or mood problems -propranolol This list may not describe all possible interactions. Give your health care provider a list of all the medicines, herbs, non-prescription drugs, or dietary supplements you use. Also tell them if  you smoke, drink alcohol, or use illegal drugs. Some items may interact with your medicine. What should I watch for while using this medicine? Only take this medicine for a migraine headache. Take it if you get warning symptoms or at the start of a migraine attack. It is not for regular use to prevent migraine attacks. You may get drowsy or dizzy. Do not drive, use machinery, or do anything that needs mental alertness until you know how this medicine affects you. To reduce dizzy or fainting spells, do not sit or stand up quickly, especially if you are an older patient. Alcohol can  increase drowsiness, dizziness and flushing. Avoid alcoholic drinks. Smoking cigarettes may increase the risk of heart-related side effects from using this medicine. If you take migraine medicines for 10 or more days a month, your migraines may get worse. Keep a diary of headache days and medicine use. Contact your healthcare professional if your migraine attacks occur more frequently. What side effects may I notice from receiving this medicine? Side effects that you should report to your doctor or health care professional as soon as possible: -allergic reactions like skin rash, itching or hives, swelling of the face, lips, or tongue -fast, slow, or irregular heart beat -increased blood pressure -palpitations -severe stomach pain and cramping, bloody diarrhea -signs and symptoms of a blood clot such as breathing problems; changes in vision; chest pain; severe, sudden headache; pain, swelling, warmth in the leg; trouble speaking; sudden numbness or weakness of the face, arm or leg -tingling, pain, or numbness in the face, hands, or feet Side effects that usually do not require medical attention (report to your doctor or health care professional if they continue or are bothersome): -dry mouth -feeling warm, flushing,or redness of the face -headache -muscle cramps, pain -nausea, vomiting -unusually tired or weak This list may not describe all possible side effects. Call your doctor for medical advice about side effects. You may report side effects to FDA at 1-800-FDA-1088. Where should I keep my medicine? Keep out of the reach of children. Store at room temperature between 20 and 25 degrees C (68 and 77 degrees F). Protect from light and moisture. Throw away any unused medicine after the expiration date. NOTE: This sheet is a summary. It may not cover all possible information. If you have questions about this medicine, talk to your doctor, pharmacist, or health care provider.    2016,  [employer] Standard. (2013-03-23 10:21:19)

## 2016-02-08 NOTE — Progress Notes (Signed)
[facility]    Provider:  Dr Jaynee Eagles Referring Provider: Selinda Orion Primary Care Physician:  Aura Dials, PA-C  CC:  migraines  HPI:  Grace Fisher is a 40 y.o. female here as a referral from Dr. Frederico Hamman for migraines. She grew up in [facility]. Past medical history migraine, depression, anxiety, morbid obesity. Migraines started at the age of 58 coiciding with her periods but no other inciting events. Over the last year daily headaches, in the fron or the top or towards the back and into the neck and she has pain in the ears, migraine always starts on the right side and spreads, throbbing, pounding, nausea, vomiting, dizziness, light sensitivity, sound sensitivity, smell triggers. Nothing makes them significantly better, she has been taking phenergan helps with nausea, laying down in a dark room and cold compress on the head feels better. No aura. She has 30 headache days a month and up to 20 days a month are migrainous. Migraines can last for days. No other associated symptoms. Her weight has been fluctuating. The amitriptyline is not working, she got very dizzy on the amitriptyline, has had multiple side effects.No medication overuse, she is not taking OTC medications. She is on atenolol but can't tolerate any more than current dose. No other focal neurologic deficits or complaints she has tried and failed multiple medications..  Reviewed notes, labs and imaging from outside physicians, which showed: Patient has a history of headaches since she was a teenager. The headache pain is severe, moderate and mild. The location is on the right side, left side, frontal, temporal, eyes and back of head. Headache last days. The pain is described as sharp, throbbing and pressure-like. The symptoms of been associated with nausea, sensitivity to light, sensitivity to noises, worse with movement activities and dizziness. Patient describes the following visual changes:  Blurred vision, double vision and white spots. Her headache triggers include clear sign, head injury, sinus problems. She has been taking gabapentin 300 mg daily and atenolol 25 mg. She cannot tolerate either at higher doses secondary to side effects. She stopped taking the Zonegran and Topamax secondary to side effects and history of kidney stones. She has intractable chronic daily migraine headache. They're frontal and pulsating cause a throbbing pain. Reports greater than 80 migraines per month and can last anywhere from 4-24 hours. Having trouble with nausea, photophobia, vomiting, phonophobia. Patient has been using Advil and Motrin daily and she was advised to stop because of overuse. She also describes pain in visual disturbance, neck pain and stiffness, dizziness, tremors, weakness, lightheadedness, numbness and headaches. Confusion and sleep disturbance. Neurologic exam was normal. Mental status was normal, physical exam including general, head, eyes, nose, throat, neck, cardiovascular, pulmonary, muscular skeletal, neurologic, skin, psychiatric is normal. Diagnosed with chronic migraines and asked to discontinue over the used counter medications.  Medications tried: Tylenol, Imitrex, atenolol, Phenergan, Motrin, Advil, Mobic, tizanidine, Flexeril, Zonegran, gabapentin, Topamax, prednisone, Ambien, Xanax., Tizanidine, recently started amitriptyline and baclofen and not working and having side effects. Also hydrocodone.  CMP unremarkable with normal creatinine 0.79, CBC normal February 2017.  Review of Systems: Patient complains of symptoms per HPI as well as the following symptoms: Fatigue blurred vision, swelling in legs, increased thirst, spinning sensation, diarrhea, constipation, frequent infections, memory loss, weakness, dizziness, insomnia, restless legs, depression, anxiety, not in sleep, decreased energy, change in appetite, disinterest in activities. Pertinent negatives per HPI. All others  negative.   Social History   Social History  .  Marital Status: Single    Spouse Name: Johnathon  . Number of Children: 2  . Years of Education: 12+   Occupational History  . Homemaker    Social History Main Topics  . Smoking status: Never Smoker   . Smokeless tobacco: Not on file  . Alcohol Use: No  . Drug Use: No  . Sexual Activity: Not on file   Other Topics Concern  . Not on file   Social History Narrative   Lives w/ fiance and 2 children   Caffeine use: 1-2 glasses per day (tea)    Family History  Problem Relation Age of Onset  . Coronary artery disease Father   . Migraines Neg Hx     Past Medical History  Diagnosis Date  . Asthma   . Migraines   . Acid reflux   . Obesity   . Renal stones t  . Family history of anesthesia complication     Son also has difficult time breathing after anesthesia  . Hypertension   . Shortness of breath   . Anxiety   . Mental disorder   . Depression   . Panic attack as reaction to stress   . Constipation, chronic   . Bronchitis 11/2012  . Urinary frequency     difficulty emptying bladder completely  . Melanoma ([place])     on back and arms  . Seasonal allergies   . Insomnia   . Bilateral carpal tunnel syndrome   . Complication of anesthesia     Had post-op fever (100 - 101) and SOB after anesthesia  . PONV (postoperative nausea and vomiting)     Past Surgical History  Procedure Laterality Date  . Cholecystectomy    . Multiple extractions with alveoloplasty Bilateral 11/30/2012    Procedure: MULTIPLE EXTRACTION ;  Surgeon: Gae Bon, DDS;  Location: [facility];  Service: Oral Surgery;  Laterality: Bilateral;  . Cystoscopy with stent placement Left 09/29/2015    Procedure: CYSTOSCOPY WITH STENT PLACEMENT;  Surgeon: Raynelle Bring, MD;  Location: [facility];  Service: Urology;  Laterality: Left;    Current Outpatient Prescriptions  Medication Sig Dispense Refill  . albuterol (PROVENTIL HFA;VENTOLIN HFA) 108 (90 BASE)  MCG/ACT inhaler Inhale 2 puffs into the lungs every 6 (six) hours as needed for shortness of breath.     . ALPRAZolam (XANAX) 0.5 MG tablet Take 0.5 mg by mouth daily as needed.    Marland Kitchen atenolol (TENORMIN) 25 MG tablet Take 25 mg by mouth daily.  1  . DULoxetine (CYMBALTA) 60 MG capsule Take 60 mg by mouth daily.  0  . loratadine (CLARITIN) 10 MG tablet Take 10 mg by mouth daily.    . montelukast (SINGULAIR) 10 MG tablet Take 10 mg by mouth at bedtime.      . ranitidine (ZANTAC) 150 MG tablet Take 150 mg by mouth at bedtime.    Marland Kitchen zolpidem (AMBIEN) 10 MG tablet Take 10 mg by mouth at bedtime.    . nortriptyline (PAMELOR) 10 MG capsule Take 2 capsules (20 mg total) by mouth at bedtime. 60 capsule 11  . rizatriptan (MAXALT-MLT) 10 MG disintegrating tablet Take 1 tablet (10 mg total) by mouth as needed for migraine. May repeat in 2 hours if needed 10 tablet 11   No current facility-administered medications for this visit.    Allergies as of 02/08/2016 - Review Complete 02/08/2016  Allergen Reaction Noted  . Acetaminophen-codeine Other (See Comments)   . Morphine Hives and Rash   .  Penicillins Rash   . Sulfa antibiotics Palpitations 08/20/2012    Vitals: BP 116/61 mmHg  Pulse 69  Ht 5\' 3"  (1.6 m)  Wt 308 lb 9.6 oz (139.98 kg)  BMI 54.68 kg/m2 Last Weight:  Wt Readings from Last 1 Encounters:  02/08/16 308 lb 9.6 oz (139.98 kg)   Last Height:   Ht Readings from Last 1 Encounters:  02/08/16 5\' 3"  (1.6 m)   Physical exam: Exam: Gen: NAD, conversant, well nourised, obese, well groomed                     CV: RRR, no MRG. No Carotid Bruits. No peripheral edema, warm, nontender Eyes: Conjunctivae clear without exudates or hemorrhage  Neuro: Detailed Neurologic Exam  Speech:    Speech is normal; fluent and spontaneous with normal comprehension.  Cognition:    The patient is oriented to person, place, and time;     recent and remote memory intact;     language fluent;     normal  attention, concentration,     fund of knowledge Cranial Nerves:    The pupils are equal, round, and reactive to light. The fundi are normal and spontaneous venous pulsations are present. Visual fields are full to finger confrontation. Extraocular movements are intact. Trigeminal sensation is intact and the muscles of mastication are normal. The face is symmetric. The palate elevates in the midline. Hearing intact. Voice is normal. Shoulder shrug is normal. The tongue has normal motion without fasciculations.   Coordination:    Normal finger to nose and heel to shin. Normal rapid alternating movements.   Gait:    Heel-toe and tandem gait are normal.   Motor Observation:    No asymmetry, no atrophy, and no involuntary movements noted. Tone:    Normal muscle tone.    Posture:    Posture is normal. normal erect    Strength:    Strength is V/V in the upper and lower limbs.      Sensation: intact to LT     Reflex Exam:  DTR's:    Deep tendon reflexes in the upper and lower extremities are normal bilaterally.   Toes:    The toes are downgoing bilaterally.   Clonus:    Clonus is absent.       Assessment/Plan:  40 year old with chronic migraines without aura intractable with status.  As far as your medications are concerned, I would like to suggest: Nortriptyline 10mg  for one week then 20mg  before bed. Maxalt at onset of migraine, can repeat in 2 hours, do not take more than 2 in one day  As far as diagnostic testing: MRI brain and orbits, Botox  I would like to see you back for botox, sooner if we need to. Please call us with any interim questions, concerns, problems, updates or refill requests.   Discussed all side effects as detailed in the patient instructions.  To prevent or relieve headaches, try the following: Cool Compress. Lie down and place a cool compress on your head.  Avoid headache triggers. If certain foods or odors seem to have triggered your migraines in the  past, avoid them. A headache diary might help you identify triggers.  Include physical activity in your daily routine. Try a daily walk or other moderate aerobic exercise.  Manage stress. Find healthy ways to cope with the stressors, such as delegating tasks on your to-do list.  Practice relaxation techniques. Try deep breathing, yoga, massage and visualization.  Eat regularly. Eating regularly scheduled meals and maintaining a healthy diet might help prevent headaches. Also, drink plenty of fluids.  Follow a regular sleep schedule. Sleep deprivation might contribute to headaches Consider biofeedback. With this mind-body technique, you learn to control certain bodily functions - such as muscle tension, heart rate and blood pressure - to prevent headaches or reduce headache pain.    Proceed to emergency room if you experience new or worsening symptoms or symptoms do not resolve, if you have new neurologic symptoms or if headache is severe, or for any concerning symptom.   [id number] [place]   Antonia Ahern, MD  [facility] [address] [place], [place] [address]  Phone [phone] Fax [phone]

## 2016-02-21 ENCOUNTER — Ambulatory Visit: Admission: RE | Admit: 2016-02-21 | Payer: Self-pay | Source: Ambulatory Visit

## 2016-02-21 ENCOUNTER — Ambulatory Visit: Payer: Self-pay

## 2016-02-21 ENCOUNTER — Ambulatory Visit
Admission: RE | Admit: 2016-02-21 | Discharge: 2016-02-21 | Disposition: A | Discharge status: Home / Self Care | Payer: Medicaid Other | Source: Ambulatory Visit | Attending: Physician Assistant | Admitting: Physician Assistant

## 2016-02-21 DIAGNOSIS — Z1231 Encounter for screening mammogram for malignant neoplasm of breast: Secondary | ICD-10-CM

## 2016-02-28 ENCOUNTER — Telehealth: Payer: Self-pay | Admitting: Neurology

## 2016-02-28 NOTE — Telephone Encounter (Signed)
Patient returned Grace Fisher's call, skyped Andee Poles, per Grace Fisher's request, patient informed medication will not be here in time for appointment tomorrow, Andee Poles will call back to reschedule.

## 2016-02-28 NOTE — Telephone Encounter (Signed)
Called patient to inform her that medication would not be here in time for apt tomorrow and we would have to reschedule. She did not answer so I left a VM asking her to return my call.

## 2016-02-29 ENCOUNTER — Ambulatory Visit: Payer: Medicaid Other | Admitting: Neurology

## 2016-03-06 ENCOUNTER — Ambulatory Visit: Payer: Medicaid Other | Admitting: Obstetrics and Gynecology

## 2016-03-11 ENCOUNTER — Ambulatory Visit
Admission: RE | Admit: 2016-03-11 | Discharge: 2016-03-11 | Disposition: A | Discharge status: Home / Self Care | Payer: Medicaid Other | Source: Ambulatory Visit | Attending: Neurology | Admitting: Neurology

## 2016-03-11 ENCOUNTER — Other Ambulatory Visit: Payer: Self-pay | Admitting: Neurology

## 2016-03-11 ENCOUNTER — Telehealth: Payer: Self-pay | Admitting: Neurology

## 2016-03-11 ENCOUNTER — Other Ambulatory Visit: Payer: Self-pay

## 2016-03-11 ENCOUNTER — Ambulatory Visit: Payer: Self-pay

## 2016-03-11 DIAGNOSIS — H5713 Ocular pain, bilateral: Secondary | ICD-10-CM

## 2016-03-11 DIAGNOSIS — R51 Headache: Principal | ICD-10-CM

## 2016-03-11 DIAGNOSIS — H539 Unspecified visual disturbance: Secondary | ICD-10-CM

## 2016-03-11 DIAGNOSIS — R519 Headache, unspecified: Secondary | ICD-10-CM

## 2016-03-11 DIAGNOSIS — H05119 Granuloma of unspecified orbit: Secondary | ICD-10-CM

## 2016-03-11 NOTE — Telephone Encounter (Signed)
That's ok, we can talk about it at next appointment. I think I placed a referral for botox, what is the status on that?

## 2016-03-11 NOTE — Telephone Encounter (Signed)
Patient called to advise she went for MRI today and "freaked out" because it was closed in, was advised she was in half open MRI, states they got just past her shoulders when freaked out, was advised to call Dr. Jaynee Eagles. Please call [phone].

## 2016-03-11 NOTE — Telephone Encounter (Signed)
Called pt back. LVM per Dr Jaynee Eagles that it is okay that she did not complete MRI. Dr Jaynee Eagles will discuss with her at next appt. Gave [facility] phone number if she has further questions.

## 2016-03-11 NOTE — Telephone Encounter (Signed)
Dr Jaynee Eagles- please advise  Called pt back. She takes a xanax regularly for her anxiety. She took 1 tab (0.5mg )  prior to to MRI and thought this would help. She got in past her shoulders and had to get out. She states she is claustrophobic and she does not think she can go through with the MRI. She states "I cannot sit through that for an hour". Advised Dr Jaynee Eagles is with pt right now and I will give her the message. I will call her back by tomorrow at the latest to advise. She verbalized understanding.

## 2016-04-01 ENCOUNTER — Telehealth: Payer: Self-pay | Admitting: Neurology

## 2016-04-01 ENCOUNTER — Encounter: Payer: Self-pay | Admitting: *Deleted

## 2016-04-01 MED ORDER — NORTRIPTYLINE HCL 10 MG PO CAPS: ORAL_CAPSULE | ORAL | 0 refills | Status: DC

## 2016-04-01 MED ORDER — ZOLMITRIPTAN 5 MG PO TABS: 5.0000 mg | ORAL_TABLET | ORAL | 5 refills | Status: DC | PRN

## 2016-04-01 NOTE — Addendum Note (Signed)
Addended by: Desmond Lope on: 04/01/2016 06:02 PM   Modules accepted: Orders

## 2016-04-01 NOTE — Telephone Encounter (Signed)
Patient is calling to schedule Botox and says medication rizatriptan (MAXALT-MLT) 10 MG disintegrating tablet is not helping with the pain in her head. Please call to schedule Botox and discuss medication.

## 2016-04-01 NOTE — Telephone Encounter (Addendum)
Per vo by Dr. Jaynee Eagles, increase nortriptyline to 3 tabs qhs - may further increase to 4 tabs qhs, if needed.  Also, provided Zomig 5mg  rx to replace Maxalt.  Pt agreeable to plan and verbalized understanding.  We will ask Andee Poles to check on the status of her Botox injections.

## 2016-04-01 NOTE — Addendum Note (Signed)
Addended by: Desmond Lope on: 04/01/2016 06:05 PM   Modules accepted: Orders

## 2016-04-03 ENCOUNTER — Telehealth: Payer: Self-pay | Admitting: *Deleted

## 2016-04-03 NOTE — Telephone Encounter (Signed)
Called and spoke to [place] at [facility] tracks to initiate PA for zolmitriptan. Gave tried/failed medications.   Ticket number [id number]. She is sending over the pharmacy for review. I gave her [facility] phone number so they can contact with any further questions. Awaiting response to PA request.

## 2016-04-10 NOTE — Telephone Encounter (Signed)
Called [place] [place] tracks to check on status of PA. Spoke to Adrian in pharmacy department. She stated other rep didn't send info over to them. I did PA over the phone with them. Gave clinical information.  PA approved from 04/10/16-04/05/17. PA # [id number].  Call ID #: [id number].

## 2016-04-15 ENCOUNTER — Telehealth: Payer: Self-pay | Admitting: Neurology

## 2016-04-15 NOTE — Telephone Encounter (Signed)
I scheduled pt botox appt on 10/12. She said the last appt was c/a by clinic bc medication was not at the clinic.

## 2016-04-15 NOTE — Telephone Encounter (Signed)
Dr Jaynee Eagles- please advise. Pt has medicaid FYI, cannot do migraine infusion.

## 2016-04-15 NOTE — Telephone Encounter (Signed)
Pt called to advise she increased the nortriptyline as directed for the past 5 days but the migraines have increased to daily, 3 out of 7 days are severe where she can't get out of the bed.  Please call

## 2016-04-16 NOTE — Telephone Encounter (Signed)
This patient was denied botox due to the fact that she has not tried and failed three different drug classes, they stated that she needed to try and fail one of the following in order to be approved. Beta blocker or calcium channel blocker. Please advise on how you would like for me to proceed.

## 2016-04-16 NOTE — Telephone Encounter (Signed)
Grace Fisher, Can we call and ask patient if she has ever been on propranolol? If not I will start her on that and see if it helps her headaches and then resubmit for botox thanks. Please see if she is open to trying a new medication class. Propranolol low dose to see if it helps. If it doesn;t then we can get botox approved. But this is a great medication for migraines as well and it may help her. We just have to watch blood pressure and pulse, watch for being light headed, dizzy, chest pain or anyother side effects.    Common propranolol side effects may include:nausea, vomiting, diarrhea, constipation, stomach cramps; decreased sex drive, impotence, or difficulty having an orgasm;sleep problems (insomnia); or tired feeling. Do NOT get pregnant on this medication. Stop immediately for: slow or uneven heartbeats;a light-headed feeling, like you might pass out;wheezing or trouble breathing;shortness of breath (even with mild exertion), swelling, rapid weight gain;.sudden weakness, vision problems, or loss of coordination (especially in a child with hemangioma that affects the face or head);cold feeling in your hands and feet;depression, confusion, hallucinations;liver problems - nausea, upper stomach pain, itching, tired feeling, loss of appetite, dark urine, clay-colored stools, jaundice (yellowing of the skin or eyes);low blood sugar - headache, hunger, weakness, sweating, confusion, irritability, dizziness, fast heart rate, or feeling jittery;low blood sugar in a baby - pale skin, blue or purple skin, sweating, fussiness, crying, not wanting to eat, feeling cold, drowsiness, weak or shallow breathing (breathing may stop for short periods), seizure (convulsions), or loss of consciousness; or severe skin reaction - fever, sore throat, swelling in your face or tongue, burning in your eyes, skin pain, followed by a red or purple skin rash that spreads (especially in the face or upper body) and causes blistering and  peeling.

## 2016-04-16 NOTE — Telephone Encounter (Signed)
Dr Jaynee Eagles- please advise  Called and spoke to pt. She declines ever trying propranolol before. She stated that she was also on atenolol right now and wondering if that would be okay to take both. I went over SE of propranolol and advised I will speak to DR Jaynee Eagles to see if ok and call her back to advise.  She stated she has been taking nortriptyline 4 tablets as prescribed and tried taking zomig tablet yesterday d/t having a migraine. She states zomig tablet made migraine worse. She ended up having to lay down, she got nauseous and ended up dry heaving. She took her phenergan to help. She is feeling better today. Only has a slight headache.

## 2016-04-16 NOTE — Telephone Encounter (Signed)
If she is on atenolol then she would qualify for botox. Andee Poles can you look into this?

## 2016-04-29 ENCOUNTER — Telehealth: Payer: Self-pay | Admitting: Neurology

## 2016-04-29 NOTE — Telephone Encounter (Signed)
Patient called to advise, migraine x 3 days with no relief, states migraines are getting severe, please call [phone].

## 2016-04-29 NOTE — Telephone Encounter (Addendum)
Dr Jaynee Eagles- please advise. Pt has [employer].   Called and spoke to pt. She stated she has tried taking ibuprofen and zomig, but nothing is helping. She took zomig about two hours ago and repeated x1, but got no relief. She is scheduled for botox 05/16/16. She had to call and schedule because she never heard about scheduling this. She also never heard back about if she can take both propranolol and atenolol. This call was on 04/15/16. Apologized and stated for her to call back if she does not hear within 24hr next time. Advised I will send message to Dr Jaynee Eagles and call her back to advise before the end of the day. She verbalized understanding.

## 2016-04-30 NOTE — Telephone Encounter (Signed)
Grace Fisher, she can come in for nerve blocks today or tomorrow if she likes and she is set up for botox on 10/2 apparently. There is a Producer, television/film/video of atenolol is she still taking it? Not sure how she is getting it. If she ran out I will start her on propranpolol but if she does have atenolol then we can;t start propranolol. thanks

## 2016-04-30 NOTE — Telephone Encounter (Signed)
Dr Jaynee Eagles- FYI  Called and spoke to pt. Made appt for tomorrow at 930am, check in 915am for nerve block. Explained this procedure to patient. She was wondering if she will be able to drive afterwards. Advised she can drive.   Patient stated she is still taking atenolol. She just got rx refilled yesterday for 30 day supply.

## 2016-05-01 ENCOUNTER — Ambulatory Visit: Payer: Self-pay | Admitting: Neurology

## 2016-05-09 ENCOUNTER — Encounter: Payer: Self-pay | Admitting: Neurology

## 2016-05-14 ENCOUNTER — Telehealth: Payer: Self-pay | Admitting: *Deleted

## 2016-05-14 NOTE — Telephone Encounter (Signed)
Danielle/Dana- can you check to make sure meds will be here Thursday? Thank you!

## 2016-05-14 NOTE — Telephone Encounter (Signed)
Checked status- pt med not here yet. Will send message to Danielle/Dana to check on this.

## 2016-05-15 NOTE — Telephone Encounter (Signed)
Grace Fisher will follow up with patient on Botox her insurance was still pending after 3:00 pm 05/14/2016. Thanks Hinton Dyer

## 2016-05-15 NOTE — Telephone Encounter (Signed)
Noted, thank you

## 2016-05-16 ENCOUNTER — Ambulatory Visit: Payer: Medicaid Other | Admitting: Neurology

## 2016-05-22 ENCOUNTER — Encounter: Payer: Self-pay | Admitting: Neurology

## 2016-06-25 ENCOUNTER — Telehealth: Payer: Self-pay | Admitting: *Deleted

## 2016-06-25 ENCOUNTER — Ambulatory Visit: Payer: Medicaid Other | Admitting: Neurology

## 2016-06-25 NOTE — Telephone Encounter (Signed)
No showed follow up appointment. 

## 2016-07-03 ENCOUNTER — Encounter: Payer: Self-pay | Admitting: Neurology

## 2016-07-10 ENCOUNTER — Ambulatory Visit: Admission: RE | Admit: 2016-07-10 | Payer: Medicaid Other | Source: Ambulatory Visit

## 2016-07-30 ENCOUNTER — Ambulatory Visit: Payer: Medicaid Other

## 2016-09-03 ENCOUNTER — Emergency Department (HOSPITAL_COMMUNITY)
Admission: EM | Admit: 2016-09-03 | Discharge: 2016-09-03 | Disposition: A | Discharge status: Home / Self Care | Payer: Medicaid Other | Attending: Emergency Medicine | Admitting: Emergency Medicine

## 2016-09-03 ENCOUNTER — Emergency Department (HOSPITAL_COMMUNITY): Payer: Medicaid Other

## 2016-09-03 ENCOUNTER — Encounter (HOSPITAL_COMMUNITY): Payer: Self-pay | Admitting: Emergency Medicine

## 2016-09-03 DIAGNOSIS — N200 Calculus of kidney: Secondary | ICD-10-CM

## 2016-09-03 DIAGNOSIS — N132 Hydronephrosis with renal and ureteral calculous obstruction: Secondary | ICD-10-CM | POA: Insufficient documentation

## 2016-09-03 DIAGNOSIS — J45909 Unspecified asthma, uncomplicated: Secondary | ICD-10-CM | POA: Insufficient documentation

## 2016-09-03 DIAGNOSIS — I10 Essential (primary) hypertension: Secondary | ICD-10-CM | POA: Insufficient documentation

## 2016-09-03 DIAGNOSIS — R109 Unspecified abdominal pain: Secondary | ICD-10-CM | POA: Diagnosis present

## 2016-09-03 DIAGNOSIS — Z85828 Personal history of other malignant neoplasm of skin: Secondary | ICD-10-CM | POA: Diagnosis not present

## 2016-09-03 LAB — URINALYSIS, ROUTINE W REFLEX MICROSCOPIC
BILIRUBIN URINE: NEGATIVE
Bacteria, UA: NONE SEEN
GLUCOSE, UA: NEGATIVE mg/dL
KETONES UR: NEGATIVE mg/dL
NITRITE: NEGATIVE
PH: 5 (ref 5.0–8.0)
Protein, ur: 100 mg/dL — AB
Specific Gravity, Urine: 1.029 (ref 1.005–1.030)

## 2016-09-03 LAB — POC URINE PREG, ED: Preg Test, Ur: NEGATIVE

## 2016-09-03 MED ORDER — TRAMADOL HCL 50 MG PO TABS
100.0000 mg | ORAL_TABLET | Freq: Once | ORAL | Status: AC
  Administered 2016-09-03: 100 mg via ORAL
  Filled 2016-09-03: qty 2

## 2016-09-03 MED ORDER — LEVOFLOXACIN 750 MG PO TABS
750.0000 mg | ORAL_TABLET | Freq: Once | ORAL | Status: AC
  Administered 2016-09-03: 750 mg via ORAL
  Filled 2016-09-03: qty 1

## 2016-09-03 MED ORDER — CIPROFLOXACIN HCL 500 MG PO TABS: 500.0000 mg | ORAL_TABLET | Freq: Two times a day (BID) | ORAL | 0 refills | Status: DC

## 2016-09-03 MED ORDER — TRAMADOL HCL 50 MG PO TABS: 100.0000 mg | ORAL_TABLET | Freq: Four times a day (QID) | ORAL | 0 refills | Status: DC | PRN

## 2016-09-03 MED ORDER — KETOROLAC TROMETHAMINE 60 MG/2ML IM SOLN
60.0000 mg | Freq: Once | INTRAMUSCULAR | Status: AC
  Administered 2016-09-03: 60 mg via INTRAMUSCULAR
  Filled 2016-09-03: qty 2

## 2016-09-03 MED ORDER — IBUPROFEN 800 MG PO TABS: 800.0000 mg | ORAL_TABLET | Freq: Three times a day (TID) | ORAL | 0 refills | Status: DC

## 2016-09-03 MED ORDER — TAMSULOSIN HCL 0.4 MG PO CAPS: 0.4000 mg | ORAL_CAPSULE | Freq: Every day | ORAL | 0 refills | Status: DC

## 2016-09-03 NOTE — ED Triage Notes (Signed)
Pt reports left sided flank pain that started a week ago and has been having episodes of urinary frequency with dysuria.

## 2016-09-03 NOTE — ED Provider Notes (Signed)
[facility] DEPT Provider Note   CSN: [id number] Arrival date & time: 09/03/16  0607     History   Chief Complaint Chief Complaint  Patient presents with  . Flank Pain    HPI Grace Fisher is a 41 y.o. female.  HPI Left flank pain for approximately one week. It is cramping and sharp in nature. Radiates to her lower left abdomen. Patient reports dysuria with urgency. She reports however she feels like she has a urinate and can only go a small amount. No fevers or chills. Some nausea. No diarrhea. Patient tried ibuprofen this morning without relief. History of ovarian cysts and kidney stones. Patient reports she saw her doctor at the beginning of last week and the urine was checked, she was told there was no infection. Past Medical History:  Diagnosis Date  . Acid reflux   . Anxiety   . Asthma   . Bilateral carpal tunnel syndrome   . Bronchitis 11/2012  . Complication of anesthesia    Had post-op fever (100 - 101) and SOB after anesthesia  . Constipation, chronic   . Depression   . Family history of anesthesia complication    Son also has difficult time breathing after anesthesia  . Hypertension   . Insomnia   . Melanoma ([place])    on back and arms  . Mental disorder   . Migraines   . Obesity   . Panic attack as reaction to stress   . PONV (postoperative nausea and vomiting)   . Renal stones t  . Seasonal allergies   . Shortness of breath   . Urinary frequency    difficulty emptying bladder completely    Patient Active Problem List   Diagnosis Date Noted  . Chronic migraine without aura, with intractable migraine, so stated, with status migrainosus 02/08/2016  . SINUSITIS, ACUTE 07/06/2009  . HEMATURIA UNSPECIFIED 05/08/2009  . ALLERGIC RHINITIS 01/30/2009  . HEMANGIOMA 01/05/2009  . ASTHMA 01/05/2009  . PNEUMONIA 12/23/2008  . BACK PAIN 10/06/2008  . MORBID OBESITY 02/19/2008  . OBSESSIVE-COMPULSIVE DISORDERS 02/19/2008  . MIGRAINE HEADACHE 02/19/2008    . GERD 02/19/2008  . CHOLELITHIASIS 02/19/2008  . OVARIAN CYST 02/19/2008  . CHOLELITHIASIS, HX OF 02/19/2008  . BIPOLAR DISORDER UNSPECIFIED 08/05/2005  . DIABETES MELLITUS, GESTATIONAL, HX OF 01/04/2005  . CARPAL TUNNEL SYNDROME, BILATERAL 08/06/2003    Past Surgical History:  Procedure Laterality Date  . CHOLECYSTECTOMY    . CYSTOSCOPY WITH STENT PLACEMENT Left 09/29/2015   Procedure: CYSTOSCOPY WITH STENT PLACEMENT;  Surgeon: Raynelle Bring, MD;  Location: [facility];  Service: Urology;  Laterality: Left;  Marland Kitchen MULTIPLE EXTRACTIONS WITH ALVEOLOPLASTY Bilateral 11/30/2012   Procedure: MULTIPLE EXTRACTION ;  Surgeon: Gae Bon, DDS;  Location: [facility];  Service: Oral Surgery;  Laterality: Bilateral;    OB History    No data available       Home Medications    Prior to Admission medications   Medication Sig Start Date End Date Taking? Authorizing Provider  albuterol (PROVENTIL HFA;VENTOLIN HFA) 108 (90 BASE) MCG/ACT inhaler Inhale 2 puffs into the lungs every 6 (six) hours as needed for shortness of breath.    Yes Historical Provider, MD  ALPRAZolam Duanne Moron) 0.5 MG tablet Take 0.5 mg by mouth daily as needed for anxiety.  08/14/15  Yes Historical Provider, MD  atenolol (TENORMIN) 25 MG tablet Take 25 mg by mouth daily. 09/02/15  Yes Historical Provider, MD  loratadine (CLARITIN) 10 MG tablet Take 10 mg by  mouth daily.   Yes Historical Provider, MD  montelukast (SINGULAIR) 10 MG tablet Take 10 mg by mouth at bedtime.     Yes Historical Provider, MD  ranitidine (ZANTAC) 150 MG tablet Take 150 mg by mouth at bedtime.   Yes Historical Provider, MD  zolpidem (AMBIEN) 10 MG tablet Take 10 mg by mouth at bedtime.   Yes Historical Provider, MD  ciprofloxacin (CIPRO) 500 MG tablet Take 1 tablet (500 mg total) by mouth 2 (two) times daily. One po bid x 7 days 09/03/16   Charlesetta Shanks, MD  ibuprofen (ADVIL,MOTRIN) 800 MG tablet Take 1 tablet (800 mg total) by mouth 3 (three) times daily. 09/03/16    Charlesetta Shanks, MD  nortriptyline (PAMELOR) 10 MG capsule Take 3 at bedtime.  May increase to 4 if needed. Patient not taking: Reported on 09/03/2016 04/01/16   Melvenia Beam, MD  tamsulosin (FLOMAX) 0.4 MG CAPS capsule Take 1 capsule (0.4 mg total) by mouth daily after supper. 09/03/16   Charlesetta Shanks, MD  traMADol (ULTRAM) 50 MG tablet Take 2 tablets (100 mg total) by mouth every 6 (six) hours as needed. 09/03/16   Charlesetta Shanks, MD  zolmitriptan (ZOMIG) 5 MG tablet Take 1 tablet (5 mg total) by mouth as needed for migraine. May repeat in 2 hrs if needed.  Max: 2 tabs/24 hrs. Patient not taking: Reported on 09/03/2016 04/01/16   Melvenia Beam, MD    Family History Family History  Problem Relation Age of Onset  . Coronary artery disease Father   . Migraines Neg Hx     Social History Social History  Substance Use Topics  . Smoking status: Never Smoker  . Smokeless tobacco: Never Used  . Alcohol use No     Allergies   Acetaminophen-codeine; Morphine; Penicillins; and Sulfa antibiotics   Review of Systems Review of Systems 10 Systems reviewed and are negative for acute change except as noted in the HPI.   Physical Exam Updated Vital Signs BP 148/86 (BP Location: Left Arm)   Pulse 78   Temp 97.7 F (36.5 C) (Oral)   Resp 20   Ht 5\' 3"  (1.6 m)   Wt (!) 311 lb 6.4 oz (141.3 kg)   LMP 08/12/2016   SpO2 100%   BMI 55.16 kg/m   Physical Exam  Constitutional: She is oriented to person, place, and time. She appears well-developed and well-nourished. No distress.  HENT:  Head: Normocephalic and atraumatic.  Eyes: Conjunctivae are normal.  Neck: Neck supple.  Cardiovascular: Normal rate and regular rhythm.   No murmur heard. Pulmonary/Chest: Effort normal and breath sounds normal. No respiratory distress.  Abdominal: Soft. There is tenderness.  Suprapubic and left lower quadrant pain to palpation. No guarding.  Genitourinary:  Genitourinary Comments:  Normal external  female genitalia. Moderate whitish yellow discharge in the vault. Positive cervical motion tenderness. Positive left adnexal tenderness to palpation  Musculoskeletal: She exhibits no edema.  Neurological: She is alert and oriented to person, place, and time. She exhibits normal muscle tone. Coordination normal.  Skin: Skin is warm and dry.  Psychiatric: She has a normal mood and affect.  Nursing note and vitals reviewed.    ED Treatments / Results  Labs (all labs ordered are listed, but only abnormal results are displayed) Labs Reviewed  URINALYSIS, ROUTINE W REFLEX MICROSCOPIC - Abnormal; Notable for the following:       Result Value   APPearance HAZY (*)    Hgb urine dipstick LARGE (*)  Protein, ur 100 (*)    Leukocytes, UA TRACE (*)    Squamous Epithelial / LPF 0-5 (*)    All other components within normal limits  POC URINE PREG, ED  GC/CHLAMYDIA PROBE AMP () NOT AT [facility]    EKG  EKG Interpretation None       Radiology US Transvaginal Non-ob  Result Date: 09/03/2016 CLINICAL DATA:  Left lower quadrant pain for the past 10 days. History of urinary tract stones. EXAM: TRANSABDOMINAL AND TRANSVAGINAL ULTRASOUND OF PELVIS TECHNIQUE: Both transabdominal and transvaginal ultrasound examinations of the pelvis were performed. Transabdominal technique was performed for global imaging of the pelvis including uterus, ovaries, adnexal regions, and pelvic cul-de-sac. It was necessary to proceed with endovaginal exam following the transabdominal exam to visualize the uterus, endometrium, ovaries, and adnexal structures. COMPARISON:  Abdominal and pelvis CT scan of September 29, 2015 FINDINGS: Uterus Measurements: 9.0 x 4.5 x 5.9 cm. There is a fibroid in the anterior left aspect of the uterine corpus measuring 9 x 8 x 7 mm. Endometrium Thickness: 10.4 mm. No abnormal endometrial fluid collections or masses are observed. Right ovary Measurements: 5.5 x 5.2 x 4.5 cm. There is a  simple appearing right ovarian cyst measuring 3.9 x 3.6 x 3.6 cm. Left ovary Measurements: 2.3 x 2.0 x 2.4 cm. Normal appearance/no adnexal mass. Other findings There is no free pelvic fluid. IMPRESSION: Subcentimeter uterine fibroid. Normal appearance of the endometrium. Simple appearing right ovarian cyst measuring 3.9 cm in greatest dimension. No solid ovarian masses are observed. There is no free pelvic fluid. Electronically Signed   By: David  Martinique M.D.   On: 09/03/2016 09:07   US Pelvis Complete  Result Date: 09/03/2016 CLINICAL DATA:  Left lower quadrant pain for the past 10 days. History of urinary tract stones. EXAM: TRANSABDOMINAL AND TRANSVAGINAL ULTRASOUND OF PELVIS TECHNIQUE: Both transabdominal and transvaginal ultrasound examinations of the pelvis were performed. Transabdominal technique was performed for global imaging of the pelvis including uterus, ovaries, adnexal regions, and pelvic cul-de-sac. It was necessary to proceed with endovaginal exam following the transabdominal exam to visualize the uterus, endometrium, ovaries, and adnexal structures. COMPARISON:  Abdominal and pelvis CT scan of September 29, 2015 FINDINGS: Uterus Measurements: 9.0 x 4.5 x 5.9 cm. There is a fibroid in the anterior left aspect of the uterine corpus measuring 9 x 8 x 7 mm. Endometrium Thickness: 10.4 mm. No abnormal endometrial fluid collections or masses are observed. Right ovary Measurements: 5.5 x 5.2 x 4.5 cm. There is a simple appearing right ovarian cyst measuring 3.9 x 3.6 x 3.6 cm. Left ovary Measurements: 2.3 x 2.0 x 2.4 cm. Normal appearance/no adnexal mass. Other findings There is no free pelvic fluid. IMPRESSION: Subcentimeter uterine fibroid. Normal appearance of the endometrium. Simple appearing right ovarian cyst measuring 3.9 cm in greatest dimension. No solid ovarian masses are observed. There is no free pelvic fluid. Electronically Signed   By: David  Martinique M.D.   On: 09/03/2016 09:07   Ct  Renal Stone Study  Result Date: 09/03/2016 CLINICAL DATA:  Right-sided abdominal pain with dysuria EXAM: CT ABDOMEN AND PELVIS WITHOUT CONTRAST TECHNIQUE: Multidetector CT imaging of the abdomen and pelvis was performed following the standard protocol without IV contrast. COMPARISON:  09/29/2015 and ultrasound from earlier in the same day. FINDINGS: Lower chest: No acute abnormality. Hepatobiliary: No focal liver abnormality is seen. Status post cholecystectomy. No biliary dilatation. Pancreas: Unremarkable. No pancreatic ductal dilatation or surrounding inflammatory changes. Spleen: Normal in  size without focal abnormality. Adrenals/Urinary Tract: The adrenal glands are within normal limits. The kidneys are well visualized bilaterally and demonstrate no evidence of renal calculi. Mild fullness of the left collecting system and left ureter is noted which extends inferiorly to the level of the ureterovesical junction. A 5 mm left UVJ stone is noted best seen on image number 119 of series 5 and image number are 83 of series 2. Stomach/Bowel: Stomach is within normal limits. Appendix appears normal. No evidence of bowel wall thickening, distention, or inflammatory changes. Vascular/Lymphatic: No significant vascular findings are present. No enlarged abdominal or pelvic lymph nodes. Reproductive: Uterus is within normal limits. 4.8 cm right ovarian cyst is noted similar to that seen on recent ultrasound. Other: No abdominal wall hernia or abnormality. No abdominopelvic ascites. Musculoskeletal: No acute or significant osseous findings. IMPRESSION: Left UVJ stone with obstructive change. Right ovarian cyst similar to that noted on prior ultrasound. Electronically Signed   By: Inez Catalina M.D.   On: 09/03/2016 11:41    Procedures Procedures (including critical care time)  Medications Ordered in ED Medications  levofloxacin (LEVAQUIN) tablet 750 mg (not administered)  ketorolac (TORADOL) injection 60 mg (60 mg  Intramuscular Given 09/03/16 0818)  traMADol (ULTRAM) tablet 100 mg (100 mg Oral Given 09/03/16 [id number])     Initial Impression / Assessment and Plan / ED Course  I have reviewed the triage vital signs and the nursing notes.  Pertinent labs & imaging results that were available during my care of the patient were reviewed by me and considered in my medical decision making (see chart for details).       Final Clinical Impressions(s) / ED Diagnoses   Final diagnoses:  Kidney stone   On examination patient seemed to have left lower quadrant pain on pelvic examination. [place] However Did Not Identify Ovarian Cyst. CT renal study obtained and confirmed 5 mm UVJ stone. Patient was well pain controlled with Toradol and tramadol. She is comfortable. Plan will be for follow-up with urology. She has been seen by urology in the past. Will cover with ciprofloxacin for white cells in the urine, denies fever or malaise. She is counseled on the necessity to return immediately should she develop fever or uncontrolled pain or other concerning symptoms. New Prescriptions New Prescriptions   CIPROFLOXACIN (CIPRO) 500 MG TABLET    Take 1 tablet (500 mg total) by mouth 2 (two) times daily. One po bid x 7 days   IBUPROFEN (ADVIL,MOTRIN) 800 MG TABLET    Take 1 tablet (800 mg total) by mouth 3 (three) times daily.   TAMSULOSIN (FLOMAX) 0.4 MG CAPS CAPSULE    Take 1 capsule (0.4 mg total) by mouth daily after supper.   TRAMADOL (ULTRAM) 50 MG TABLET    Take 2 tablets (100 mg total) by mouth every 6 (six) hours as needed.     Charlesetta Shanks, MD 09/03/16 1341

## 2016-09-04 LAB — URINE CULTURE: CULTURE: NO GROWTH

## 2016-09-10 ENCOUNTER — Other Ambulatory Visit: Payer: Self-pay | Admitting: Neurology

## 2016-11-01 ENCOUNTER — Other Ambulatory Visit: Payer: Self-pay | Admitting: Neurology

## 2017-04-12 ENCOUNTER — Encounter (HOSPITAL_COMMUNITY): Payer: Self-pay | Admitting: *Deleted

## 2017-04-12 ENCOUNTER — Ambulatory Visit (HOSPITAL_COMMUNITY)
Admission: EM | Admit: 2017-04-12 | Discharge: 2017-04-12 | Disposition: A | Discharge status: Home / Self Care | Payer: Medicaid Other | Attending: Family Medicine | Admitting: Family Medicine

## 2017-04-12 DIAGNOSIS — B029 Zoster without complications: Secondary | ICD-10-CM | POA: Diagnosis not present

## 2017-04-12 DIAGNOSIS — N200 Calculus of kidney: Secondary | ICD-10-CM

## 2017-04-12 HISTORY — DX: Zoster without complications: B02.9

## 2017-04-12 HISTORY — DX: Calculus of kidney: N20.0

## 2017-04-12 MED ORDER — VALACYCLOVIR HCL 1 G PO TABS: 1000.0000 mg | ORAL_TABLET | Freq: Three times a day (TID) | ORAL | 0 refills | Status: DC

## 2017-04-12 NOTE — ED Provider Notes (Signed)
[facility]    CSN: [id number] Arrival date & time: 04/12/17  7616     History   Chief Complaint Chief Complaint  Patient presents with  . Rash    HPI Grace Fisher is a 41 y.o. female.   41 year old female complaining of a burning painful itchy rash to the right axilla. It started in the middle of the axilla approximately 2 and half days ago and has been feeling in since. She has a history of shingles. The first episode was located to the right posterior lateral chest. History of chickenpox.      Past Medical History:  Diagnosis Date  . Acid reflux   . Anxiety   . Asthma   . Bilateral carpal tunnel syndrome   . Bronchitis 11/2012  . Complication of anesthesia    Had post-op fever (100 - 101) and SOB after anesthesia  . Constipation, chronic   . Depression   . Family history of anesthesia complication    Son also has difficult time breathing after anesthesia  . Hypertension   . Insomnia   . Melanoma ([place])    on back and arms  . Mental disorder   . Migraines   . Obesity   . Panic attack as reaction to stress   . PONV (postoperative nausea and vomiting)   . Renal stones t  . Seasonal allergies   . Shingles   . Shortness of breath   . Urinary frequency    difficulty emptying bladder completely    Patient Active Problem List   Diagnosis Date Noted  . Chronic migraine without aura, with intractable migraine, so stated, with status migrainosus 02/08/2016  . SINUSITIS, ACUTE 07/06/2009  . HEMATURIA UNSPECIFIED 05/08/2009  . ALLERGIC RHINITIS 01/30/2009  . HEMANGIOMA 01/05/2009  . ASTHMA 01/05/2009  . PNEUMONIA 12/23/2008  . BACK PAIN 10/06/2008  . MORBID OBESITY 02/19/2008  . OBSESSIVE-COMPULSIVE DISORDERS 02/19/2008  . MIGRAINE HEADACHE 02/19/2008  . GERD 02/19/2008  . CHOLELITHIASIS 02/19/2008  . OVARIAN CYST 02/19/2008  . CHOLELITHIASIS, HX OF 02/19/2008  . BIPOLAR DISORDER UNSPECIFIED 08/05/2005  . DIABETES MELLITUS, GESTATIONAL, HX  OF 01/04/2005  . CARPAL TUNNEL SYNDROME, BILATERAL 08/06/2003    Past Surgical History:  Procedure Laterality Date  . CHOLECYSTECTOMY    . CYSTOSCOPY WITH STENT PLACEMENT Left 09/29/2015   Procedure: CYSTOSCOPY WITH STENT PLACEMENT;  Surgeon: Raynelle Bring, MD;  Location: [facility];  Service: Urology;  Laterality: Left;  Marland Kitchen MULTIPLE EXTRACTIONS WITH ALVEOLOPLASTY Bilateral 11/30/2012   Procedure: MULTIPLE EXTRACTION ;  Surgeon: Gae Bon, DDS;  Location: [facility];  Service: Oral Surgery;  Laterality: Bilateral;    OB History    No data available       Home Medications    Prior to Admission medications   Medication Sig Start Date End Date Taking? Authorizing Provider  albuterol (PROVENTIL HFA;VENTOLIN HFA) 108 (90 BASE) MCG/ACT inhaler Inhale 2 puffs into the lungs every 6 (six) hours as needed for shortness of breath.    Yes [provider]  ALPRAZolam Duanne Moron) 0.5 MG tablet Take 0.5 mg by mouth daily as needed for anxiety.  08/14/15  Yes [provider]  atenolol (TENORMIN) 25 MG tablet Take 25 mg by mouth daily. 09/02/15  Yes [provider]  DULoxetine HCl (CYMBALTA PO) Take by mouth.   Yes [provider]  ibuprofen (ADVIL,MOTRIN) 800 MG tablet Take 1 tablet (800 mg total) by mouth 3 (three) times daily. 09/03/16  Yes Charlesetta Shanks, MD  loratadine (CLARITIN) 10 MG tablet Take 10 mg by mouth daily.   Yes [provider]  montelukast (SINGULAIR) 10 MG tablet Take 10 mg by mouth at bedtime.     Yes [provider]  nortriptyline (PAMELOR) 10 MG capsule Take 3 at bedtime.  May increase to 4 if needed. 04/01/16  Yes Melvenia Beam, MD  ranitidine (ZANTAC) 150 MG tablet Take 150 mg by mouth at bedtime.   Yes [provider]  tamsulosin (FLOMAX) 0.4 MG CAPS capsule Take 1 capsule (0.4 mg total) by mouth daily after supper. 09/03/16  Yes Charlesetta Shanks, MD  zolmitriptan (ZOMIG) 5 MG tablet Take 1 tablet (5 mg total) by mouth as  needed for migraine. May repeat in 2 hrs if needed.  Max: 2 tabs/24 hrs. 04/01/16  Yes Melvenia Beam, MD  zolpidem (AMBIEN) 10 MG tablet Take 10 mg by mouth at bedtime.   Yes [provider]  ciprofloxacin (CIPRO) 500 MG tablet Take 1 tablet (500 mg total) by mouth 2 (two) times daily. One po bid x 7 days 09/03/16   Charlesetta Shanks, MD  traMADol (ULTRAM) 50 MG tablet Take 2 tablets (100 mg total) by mouth every 6 (six) hours as needed. 09/03/16   Charlesetta Shanks, MD  valACYclovir (VALTREX) 1000 MG tablet Take 1 tablet (1,000 mg total) by mouth 3 (three) times daily. 04/12/17   Janne Napoleon, NP    Family History Family History  Problem Relation Age of Onset  . Coronary artery disease Father   . Migraines Neg Hx     Social History Social History  Substance Use Topics  . Smoking status: Never Smoker  . Smokeless tobacco: Never Used  . Alcohol use No     Allergies   Acetaminophen-codeine; Morphine; Penicillins; and Sulfa antibiotics   Review of Systems Review of Systems  Constitutional: Negative.   HENT: Negative.   Respiratory: Negative.   Skin:       Rash as per history of present illness  Neurological: Negative.   All other systems reviewed and are negative.    Physical Exam Triage Vital Signs ED Triage Vitals  Enc Vitals Group     BP 04/12/17 1930 (!) 125/58     Pulse Rate 04/12/17 1930 79     Resp 04/12/17 1930 16     Temp 04/12/17 1930 98.6 F (37 C)     Temp Source 04/12/17 1930 Oral     SpO2 04/12/17 1930 98 %     Weight --      Height --      Head Circumference --      Peak Flow --      Pain Score 04/12/17 1933 8     Pain Loc --      Pain Edu? --      Excl. in [place]? --    No data found.   Updated Vital Signs BP (!) 125/58   Pulse 79   Temp 98.6 F (37 C) (Oral)   Resp 16   LMP 04/08/2017 (Exact Date)   SpO2 98%   Visual Acuity Right Eye Distance:   Left Eye Distance:   Bilateral Distance:    Right Eye Near:   Left Eye Near:      Bilateral Near:     Physical Exam  Constitutional: She is oriented to person, place, and time. She appears well-developed and well-nourished. No distress.  Neck: Neck supple.  Neurological: She is alert and oriented to person, place,  and time.  Skin: Skin is warm and dry.  Papulovesicular lesions on a red base located to the right axilla along the T1-T2 dermatome  Psychiatric: She has a normal mood and affect.  Nursing note and vitals reviewed.    UC Treatments / Results  Labs (all labs ordered are listed, but only abnormal results are displayed) Labs Reviewed - No data to display  EKG  EKG Interpretation None       Radiology No results found.  Procedures Procedures (including critical care time)  Medications Ordered in UC Medications - No data to display   Initial Impression / Assessment and Plan / UC Course  I have reviewed the triage vital signs and the nursing notes.  Pertinent labs & imaging results that were available during my care of the patient were reviewed by me and considered in my medical decision making (see chart for details).    Take the Valtrex as directed. Also obtain the lidocaine 4% patches to place on the rash for pain relief. May also take ibuprofen and Tylenol    Final Clinical Impressions(s) / UC Diagnoses   Final diagnoses:  Herpes zoster without complication    New Prescriptions New Prescriptions   VALACYCLOVIR (VALTREX) 1000 MG TABLET    Take 1 tablet (1,000 mg total) by mouth 3 (three) times daily.     Controlled Substance Prescriptions [place] Controlled Substance Registry consulted? Not Applicable   Janne Napoleon, NP 04/12/17 2039

## 2017-04-12 NOTE — ED Triage Notes (Signed)
Reports starting with right axillary and right side rash starting 2 days ago with significant pain and pruritis.

## 2017-04-12 NOTE — Discharge Instructions (Signed)
Take the Valtrex as directed. Also obtain the lidocaine 4% patches to place on the rash for pain relief. May also take ibuprofen and Tylenol

## 2017-08-13 DIAGNOSIS — J45909 Unspecified asthma, uncomplicated: Secondary | ICD-10-CM | POA: Insufficient documentation

## 2017-08-15 ENCOUNTER — Inpatient Hospital Stay (HOSPITAL_COMMUNITY)
Admission: AD | Admit: 2017-08-15 | Discharge: 2017-08-15 | Disposition: A | Discharge status: Home / Self Care | Payer: Medicaid Other | Source: Ambulatory Visit | Attending: Obstetrics and Gynecology | Admitting: Obstetrics and Gynecology

## 2017-08-15 ENCOUNTER — Inpatient Hospital Stay (HOSPITAL_COMMUNITY): Payer: Medicaid Other

## 2017-08-15 ENCOUNTER — Encounter (HOSPITAL_COMMUNITY): Payer: Self-pay | Admitting: *Deleted

## 2017-08-15 DIAGNOSIS — Z87442 Personal history of urinary calculi: Secondary | ICD-10-CM | POA: Diagnosis not present

## 2017-08-15 DIAGNOSIS — D259 Leiomyoma of uterus, unspecified: Secondary | ICD-10-CM | POA: Insufficient documentation

## 2017-08-15 DIAGNOSIS — I10 Essential (primary) hypertension: Secondary | ICD-10-CM

## 2017-08-15 DIAGNOSIS — Z3A01 Less than 8 weeks gestation of pregnancy: Secondary | ICD-10-CM | POA: Insufficient documentation

## 2017-08-15 DIAGNOSIS — O99211 Obesity complicating pregnancy, first trimester: Secondary | ICD-10-CM | POA: Diagnosis not present

## 2017-08-15 DIAGNOSIS — O209 Hemorrhage in early pregnancy, unspecified: Secondary | ICD-10-CM | POA: Diagnosis not present

## 2017-08-15 DIAGNOSIS — Z882 Allergy status to sulfonamides status: Secondary | ICD-10-CM | POA: Insufficient documentation

## 2017-08-15 DIAGNOSIS — O09521 Supervision of elderly multigravida, first trimester: Secondary | ICD-10-CM | POA: Diagnosis not present

## 2017-08-15 DIAGNOSIS — Z880 Allergy status to penicillin: Secondary | ICD-10-CM | POA: Insufficient documentation

## 2017-08-15 DIAGNOSIS — O26891 Other specified pregnancy related conditions, first trimester: Secondary | ICD-10-CM | POA: Diagnosis not present

## 2017-08-15 DIAGNOSIS — Z85820 Personal history of malignant melanoma of skin: Secondary | ICD-10-CM | POA: Diagnosis not present

## 2017-08-15 DIAGNOSIS — D251 Intramural leiomyoma of uterus: Secondary | ICD-10-CM

## 2017-08-15 DIAGNOSIS — Z3491 Encounter for supervision of normal pregnancy, unspecified, first trimester: Secondary | ICD-10-CM

## 2017-08-15 DIAGNOSIS — R109 Unspecified abdominal pain: Secondary | ICD-10-CM | POA: Diagnosis not present

## 2017-08-15 DIAGNOSIS — Z885 Allergy status to narcotic agent status: Secondary | ICD-10-CM | POA: Insufficient documentation

## 2017-08-15 DIAGNOSIS — N939 Abnormal uterine and vaginal bleeding, unspecified: Secondary | ICD-10-CM | POA: Diagnosis present

## 2017-08-15 DIAGNOSIS — O3411 Maternal care for benign tumor of corpus uteri, first trimester: Secondary | ICD-10-CM | POA: Diagnosis not present

## 2017-08-15 DIAGNOSIS — O10011 Pre-existing essential hypertension complicating pregnancy, first trimester: Secondary | ICD-10-CM | POA: Diagnosis not present

## 2017-08-15 DIAGNOSIS — Z79899 Other long term (current) drug therapy: Secondary | ICD-10-CM | POA: Insufficient documentation

## 2017-08-15 LAB — URINALYSIS, ROUTINE W REFLEX MICROSCOPIC
BILIRUBIN URINE: NEGATIVE
Glucose, UA: NEGATIVE mg/dL
Ketones, ur: NEGATIVE mg/dL
Nitrite: NEGATIVE
Protein, ur: 100 mg/dL — AB
Specific Gravity, Urine: 1.026 (ref 1.005–1.030)
pH: 5 (ref 5.0–8.0)

## 2017-08-15 LAB — POCT PREGNANCY, URINE: PREG TEST UR: POSITIVE — AB

## 2017-08-15 MED ORDER — LABETALOL HCL 100 MG PO TABS: 100.0000 mg | ORAL_TABLET | Freq: Two times a day (BID) | ORAL | 0 refills | Status: DC

## 2017-08-15 NOTE — Discharge Instructions (Signed)
Pelvic Rest Pelvic rest may be recommended if:  Your placenta is partially or completely covering the opening of your cervix (placenta previa).  There is bleeding between the wall of the uterus and the amniotic sac in the first trimester of pregnancy (subchorionic hemorrhage).  You went into labor too early (preterm labor).  Based on your overall health and the health of your baby, your health care provider will decide if pelvic rest is right for you. How do I rest my pelvis? For as long as told by your health care provider:  Do not have sex, sexual stimulation, or an orgasm.  Do not use tampons. Do not douche. Do not put anything in your vagina.  Do not lift anything that is heavier than 10 lb (4.5 kg).  Avoid activities that take a lot of effort (are strenuous).  Avoid any activity in which your pelvic muscles could become strained.  When should I seek medical care? Seek medical care if you have:  Cramping pain in your lower abdomen.  Vaginal discharge.  A low, dull backache.  Regular contractions.  Uterine tightening.  When should I seek immediate medical care? Seek immediate medical care if:  You have vaginal bleeding and you are pregnant.  This information is not intended to replace advice given to you by your health care provider. Make sure you discuss any questions you have with your health care provider. Document Released: 11/16/2010 Document Revised: 12/28/2015 Document Reviewed: 01/23/2015 Elsevier Interactive Patient Education  2018 [employer]. Uterine Fibroids Uterine fibroids are tissue masses (tumors). They are also called leiomyomas. They can develop inside of a womans womb (uterus). They can grow very large. Fibroids are not cancerous (benign). Most fibroids do not require medical treatment. Follow these instructions at home:  Keep all follow-up visits as told by your doctor. This is important.  Take medicines only as told by your doctor. ? If  you were prescribed a hormone treatment, take the hormone medicines exactly as told. ? Do not take aspirin. It can cause bleeding.  Ask your doctor about taking iron pills and increasing the amount of dark green, leafy vegetables in your diet. These actions can help to boost your blood iron levels.  Pay close attention to your period. Tell your doctor about any changes, such as: ? Increased blood flow. This may require you to use more pads or tampons than usual per month. ? A change in the number of days that your period lasts per month. ? A change in symptoms that come with your period, such as back pain or cramping in your belly area (abdomen). Contact a doctor if:  You have pain in your back or the area between your hip bones (pelvic area) that is not controlled by medicines.  You have pain in your abdomen that is not controlled with medicines.  You have an increase in bleeding between and during periods.  You soak tampons or pads in a half hour or less.  You feel lightheaded.  You feel extra tired.  You feel weak. Get help right away if:  You pass out (faint).  You have a sudden increase in pelvic pain. This information is not intended to replace advice given to you by your health care provider. Make sure you discuss any questions you have with your health care provider. Document Released: 08/24/2010 Document Revised: 03/22/2016 Document Reviewed: 01/18/2014 Elsevier Interactive Patient Education  2018 [employer]. Hypertension During Pregnancy Hypertension is also called high blood pressure. High blood  pressure means that the force of your blood moving in your body is too strong. When you are pregnant, this condition should be watched carefully. It can cause problems for you and your baby. Follow these instructions at home: Eating and drinking  Drink enough fluid to keep your pee (urine) clear or pale yellow.  Eat healthy foods that are low in salt (sodium). ? Do not  add salt to your food. ? Check labels on foods and drinks to see much salt is in them. Look on the label where you see "Sodium." Lifestyle  Do not use any products that contain nicotine or tobacco, such as cigarettes and e-cigarettes. If you need help quitting, ask your doctor.  Do not use alcohol.  Avoid caffeine.  Avoid stress. Rest and get plenty of sleep. General instructions  Take over-the-counter and prescription medicines only as told by your doctor.  While lying down, lie on your left side. This keeps pressure off your baby.  While sitting or lying down, raise (elevate) your feet. Try putting some pillows under your lower legs.  Exercise regularly. Ask your doctor what kinds of exercise are best for you.  Keep all prenatal and follow-up visits as told by your doctor. This is important. Contact a doctor if:  You have symptoms that your doctor told you to watch for, such as: ? Fever. ? Throwing up (vomiting). ? Headache. Get help right away if:  You have very bad pain in your belly (abdomen).  You are throwing up, and this does not get better with treatment.  You suddenly get swelling in your hands, ankles, or face.  You gain 4 lb (1.8 kg) or more in 1 week.  You get bleeding from your vagina.  You have blood in your pee.  You do not feel your baby moving as much as normal.  You have a change in vision.  You have muscle twitching or sudden tightening (spasms).  You have trouble breathing.  Your lips or fingernails turn blue. This information is not intended to replace advice given to you by your health care provider. Make sure you discuss any questions you have with your health care provider. Document Released: 08/24/2010 Document Revised: 04/02/2016 Document Reviewed: 04/02/2016 Elsevier Interactive Patient Education  2018 [employer]. Vaginal Bleeding During Pregnancy, First Trimester A small amount of bleeding (spotting) from the vagina is relatively  common in early pregnancy. It usually stops on its own. Various things may cause bleeding or spotting in early pregnancy. Some bleeding may be related to the pregnancy, and some may not. In most cases, the bleeding is normal and is not a problem. However, bleeding can also be a sign of something serious. Be sure to tell your health care provider about any vaginal bleeding right away. Some possible causes of vaginal bleeding during the first trimester include:  Infection or inflammation of the cervix.  Growths (polyps) on the cervix.  Miscarriage or threatened miscarriage.  Pregnancy tissue has developed outside of the uterus and in a fallopian tube (tubal pregnancy).  Tiny cysts have developed in the uterus instead of pregnancy tissue (molar pregnancy).  Follow these instructions at home: Watch your condition for any changes. The following actions may help to lessen any discomfort you are feeling:  Follow your health care provider's instructions for limiting your activity. If your health care provider orders bed rest, you may need to stay in bed and only get up to use the bathroom. However, your health care provider may  allow you to continue light activity.  If needed, make plans for someone to help with your regular activities and responsibilities while you are on bed rest.  Keep track of the number of pads you use each day, how often you change pads, and how soaked (saturated) they are. Write this down.  Do not use tampons. Do not douche.  Do not have sexual intercourse or orgasms until approved by your health care provider.  If you pass any tissue from your vagina, save the tissue so you can show it to your health care provider.  Only take over-the-counter or prescription medicines as directed by your health care provider.  Do not take aspirin because it can make you bleed.  Keep all follow-up appointments as directed by your health care provider.  Contact a health care provider  if:  You have any vaginal bleeding during any part of your pregnancy.  You have cramps or labor pains.  You have a fever, not controlled by medicine. Get help right away if:  You have severe cramps in your back or belly (abdomen).  You pass large clots or tissue from your vagina.  Your bleeding increases.  You feel light-headed or weak, or you have fainting episodes.  You have chills.  You are leaking fluid or have a gush of fluid from your vagina.  You pass out while having a bowel movement. This information is not intended to replace advice given to you by your health care provider. Make sure you discuss any questions you have with your health care provider. Document Released: 05/01/2005 Document Revised: 12/28/2015 Document Reviewed: 03/29/2013 Elsevier Interactive Patient Education  [employer].

## 2017-08-15 NOTE — MAU Provider Note (Signed)
History     CSN: [id number]  Arrival date and time: 08/15/17 1452   None     Chief Complaint  Patient presents with  . Vaginal Bleeding   HPI Grace Fisher is 42 y.o. [id number] [redacted]w[redacted]d weeks presenting with vaginal bleeding off and on since U/S in the office on 08/12/2016.  Initially light but today at 2pm there was a lot more blood, a few clots in toilet. Having cramping that she rates as 4/10.  She reports having looser stools that normal but not watery yesterday.  Today she had 2 BMs that continue loose.  Had nausea and chills last night.  Neg for vomiting and fever. She is taking Diclegis for nausea, last taken noon.  BP is elevated.  She is a patient of Dr. Roe Rutherford.    Past Medical History:  Diagnosis Date  . Acid reflux   . Anxiety   . Asthma   . Bilateral carpal tunnel syndrome   . Bronchitis 11/2012  . Complication of anesthesia    Had post-op fever (100 - 101) and SOB after anesthesia  . Constipation, chronic   . Depression   . Family history of anesthesia complication    Son also has difficult time breathing after anesthesia  . Hypertension   . Insomnia   . Melanoma ([place])    on back and arms  . Mental disorder   . Migraines   . Obesity   . Panic attack as reaction to stress   . PONV (postoperative nausea and vomiting)   . Renal stones t  . Seasonal allergies   . Shingles   . Shortness of breath   . Urinary frequency    difficulty emptying bladder completely    Past Surgical History:  Procedure Laterality Date  . CHOLECYSTECTOMY    . CYSTOSCOPY WITH STENT PLACEMENT Left 09/29/2015   Procedure: CYSTOSCOPY WITH STENT PLACEMENT;  Surgeon: Raynelle Bring, MD;  Location: [facility];  Service: Urology;  Laterality: Left;  Marland Kitchen MULTIPLE EXTRACTIONS WITH ALVEOLOPLASTY Bilateral 11/30/2012   Procedure: MULTIPLE EXTRACTION ;  Surgeon: Gae Bon, DDS;  Location: [place];  Service: Oral Surgery;  Laterality: Bilateral;    Family History  Problem Relation Age of Onset  .  Coronary artery disease Father   . Migraines Neg Hx     Social History   Tobacco Use  . Smoking status: Never Smoker  . Smokeless tobacco: Never Used  Substance Use Topics  . Alcohol use: No  . Drug use: No    Allergies:  Allergies  Allergen Reactions  . Acetaminophen-Codeine Other (See Comments)    severe migraine headache  . Morphine Hives and Rash  . Penicillins Rash    Has patient had a PCN reaction causing immediate rash, facial/tongue/throat swelling, SOB or lightheadedness with hypotension: No Has patient had a PCN reaction causing severe rash involving mucus membranes or skin necrosis: No Has patient had a PCN reaction that required hospitalization No Has patient had a PCN reaction occurring within the last 10 years: No If all of the above answers are "NO", then may proceed with Cephalosporin use.  . Sulfa Antibiotics Palpitations    REACTION:  fever    Medications Prior to Admission  Medication Sig Dispense Refill Last Dose  . albuterol (PROVENTIL HFA;VENTOLIN HFA) 108 (90 BASE) MCG/ACT inhaler Inhale 2 puffs into the lungs every 6 (six) hours as needed for shortness of breath.    08/14/2017 at Unknown time  . busPIRone (BUSPAR) 10 MG  tablet Take 10 mg by mouth 3 (three) times daily.   08/15/2017 at Unknown time  . doxylamine, Sleep, (UNISOM) 25 MG tablet Take 25 mg by mouth at bedtime as needed for sleep.   08/14/2017 at Unknown time  . Doxylamine-Pyridoxine (DICLEGIS) 10-10 MG TBEC Take 1-2 tablets by mouth 3 (three) times daily. 1 in the morning and afternoon, 2 at night   08/15/2017 at Unknown time  . loratadine (CLARITIN) 10 MG tablet Take 10 mg by mouth daily.   08/14/2017 at Unknown time  . Prenatal Vit-Fe Fumarate-FA (PRENATAL MULTIVITAMIN) TABS tablet Take 2 tablets by mouth daily at 12 noon.   08/15/2017 at Unknown time  . ranitidine (ZANTAC) 150 MG tablet Take 150 mg by mouth at bedtime.   08/14/2017 at Unknown time  . ALPRAZolam (XANAX) 0.5 MG tablet Take 0.5 mg  by mouth daily as needed for anxiety.    Not Taking at Unknown time  . atenolol (TENORMIN) 25 MG tablet Take 25 mg by mouth daily.  1 Not Taking at Unknown time  . montelukast (SINGULAIR) 10 MG tablet Take 10 mg by mouth at bedtime.     Not Taking at Unknown time  . nortriptyline (PAMELOR) 10 MG capsule Take 3 at bedtime.  May increase to 4 if needed. (Patient not taking: Reported on 08/15/2017) 120 capsule 0 Not Taking at Unknown time  . tamsulosin (FLOMAX) 0.4 MG CAPS capsule Take 1 capsule (0.4 mg total) by mouth daily after supper. (Patient not taking: Reported on 08/15/2017) 30 capsule 0 Not Taking at Unknown time  . traMADol (ULTRAM) 50 MG tablet Take 2 tablets (100 mg total) by mouth every 6 (six) hours as needed. (Patient not taking: Reported on 08/15/2017) 30 tablet 0 Not Taking at Unknown time  . valACYclovir (VALTREX) 1000 MG tablet Take 1 tablet (1,000 mg total) by mouth 3 (three) times daily. (Patient not taking: Reported on 08/15/2017) 21 tablet 0 Not Taking at Unknown time  . zolmitriptan (ZOMIG) 5 MG tablet Take 1 tablet (5 mg total) by mouth as needed for migraine. May repeat in 2 hrs if needed.  Max: 2 tabs/24 hrs. (Patient not taking: Reported on 08/15/2017) 12 tablet 5 Not Taking at Unknown time  . zolpidem (AMBIEN) 10 MG tablet Take 10 mg by mouth at bedtime.   Not Taking at Unknown time    Review of Systems  Constitutional: Negative for appetite change and fever.  Respiratory: Negative for chest tightness.   Cardiovascular: Negative for chest pain.  Gastrointestinal: Positive for abdominal pain, diarrhea (loose but not stools that are runny) and nausea. Negative for vomiting. Abdominal distention: moderate cramping in the lower abdomen.  Genitourinary: Positive for vaginal bleeding (intermittent bleeding with small clots). Negative for dysuria, flank pain, hematuria and vaginal discharge.  Musculoskeletal: Negative for back pain.  Psychiatric/Behavioral: The patient is not  nervous/anxious.    Physical Exam   Blood pressure (!) 141/81, pulse 93, temperature 98.4 F (36.9 C), resp. rate 18, last menstrual period 06/07/2017.  Physical Exam  Nursing note and vitals reviewed. Constitutional: She is oriented to person, place, and time. She appears well-developed and well-nourished. No distress.  HENT:  Head: Normocephalic.  Neck: Normal range of motion.  Cardiovascular: Normal rate.  Respiratory: Effort normal.  GI: Soft. She exhibits no distension and no mass. There is tenderness (slight tenderness in the lower abdomen). There is no rebound and no guarding.  Genitourinary: There is no rash, tenderness or lesion on the right labia. There  is no rash on the left labia. Uterus is enlarged (difficult to assess due to body habitus). Uterus is not tender. Cervix exhibits no motion tenderness, no discharge and no friability. Right adnexum displays tenderness (mild tenderness bilaterally on exam). Right adnexum displays no mass and no fullness. Left adnexum displays tenderness. Left adnexum displays no mass and no fullness. There is bleeding (small amount of bright red blood with 1 small clot noted) in the vagina. No erythema or tenderness in the vagina.  Neurological: She is alert and oriented to person, place, and time.  Skin: Skin is warm and dry.  Psychiatric: She has a normal mood and affect. Her behavior is normal. Thought content normal.   Results for orders placed or performed during the hospital encounter of 08/15/17 (from the past 24 hour(s))  Urinalysis, Routine w reflex microscopic     Status: Abnormal   Collection Time: 08/15/17  3:00 PM  Result Value Ref Range   Color, Urine AMBER (A) YELLOW   APPearance CLOUDY (A) CLEAR   Specific Gravity, Urine 1.026 1.005 - 1.030   pH 5.0 5.0 - 8.0   Glucose, UA NEGATIVE NEGATIVE mg/dL   Hgb urine dipstick LARGE (A) NEGATIVE   Bilirubin Urine NEGATIVE NEGATIVE   Ketones, ur NEGATIVE NEGATIVE mg/dL   Protein, ur 100  (A) NEGATIVE mg/dL   Nitrite NEGATIVE NEGATIVE   Leukocytes, UA TRACE (A) NEGATIVE   RBC / HPF TOO NUMEROUS TO COUNT 0 - 5 RBC/hpf   WBC, UA TOO NUMEROUS TO COUNT 0 - 5 WBC/hpf   Bacteria, UA RARE (A) NONE SEEN   Squamous Epithelial / LPF 6-30 (A) NONE SEEN   Mucus PRESENT    Ca Oxalate Crys, UA PRESENT   Pregnancy, urine POC     Status: Abnormal   Collection Time: 08/15/17  3:11 PM  Result Value Ref Range   Preg Test, Ur POSITIVE (A) NEGATIVE  US Ob Comp Less 14 Wks  Result Date: 08/15/2017 CLINICAL DATA:  Abdominal pain and vaginal bleeding for 3 days. Gestational age by LMP of 9 weeks 6 days. Fibroids. EXAM: OBSTETRIC <14 WK [place] AND TRANSVAGINAL OB US TECHNIQUE: Both transabdominal and transvaginal ultrasound examinations were performed for complete evaluation of the gestation as well as the maternal uterus, adnexal regions, and pelvic cul-de-sac. Transvaginal technique was performed to assess early pregnancy. COMPARISON:  Pelvic ultrasound on 09/03/2016 FINDINGS: Intrauterine gestational sac: Single Yolk sac:  Visualized. Embryo:  Visualized. Cardiac Activity: Visualized. Heart Rate: 186 bpm CRL:  14 mm   7 w   5 d                  [place] EDC: 03/29/2018 Subchorionic hemorrhage:  None visualized. Maternal uterus/adnexae: A fibroid is seen in the anterior uterine corpus which measures 1.8 cm. Small right ovarian corpus luteum cyst noted. Normal appearance of left ovary. No suspicious adnexal mass or free fluid identified. IMPRESSION: Single living IUP measuring 7 weeks 5 days, with [place] EDC of 03/29/2018. This is approximately 2 weeks behind menstrual dates. Small anterior uterine fibroid. Electronically Signed   By: Earle Gell M.D.   On: 08/15/2017 16:54   US Ob Transvaginal  Result Date: 08/15/2017 CLINICAL DATA:  Abdominal pain and vaginal bleeding for 3 days. Gestational age by LMP of 9 weeks 6 days. Fibroids. EXAM: OBSTETRIC <14 WK [place] AND TRANSVAGINAL OB US TECHNIQUE: Both transabdominal and  transvaginal ultrasound examinations were performed for complete evaluation of the gestation as well as the maternal  uterus, adnexal regions, and pelvic cul-de-sac. Transvaginal technique was performed to assess early pregnancy. COMPARISON:  Pelvic ultrasound on 09/03/2016 FINDINGS: Intrauterine gestational sac: Single Yolk sac:  Visualized. Embryo:  Visualized. Cardiac Activity: Visualized. Heart Rate: 186 bpm CRL:  14 mm   7 w   5 d                  [place] EDC: 03/29/2018 Subchorionic hemorrhage:  None visualized. Maternal uterus/adnexae: A fibroid is seen in the anterior uterine corpus which measures 1.8 cm. Small right ovarian corpus luteum cyst noted. Normal appearance of left ovary. No suspicious adnexal mass or free fluid identified. IMPRESSION: Single living IUP measuring 7 weeks 5 days, with [place] EDC of 03/29/2018. This is approximately 2 weeks behind menstrual dates. Small anterior uterine fibroid. Electronically Signed   By: Earle Gell M.D.   On: 08/15/2017 16:54   Vitals:   08/15/17 1521 08/15/17 1717  BP: (!) 160/82 (!) 141/81  Pulse: 93   Resp: 18   Temp: 98.4 F (36.9 C)    MAU Course  Procedures  MDM MSE Exam labs Reported MSE, vital signs, Labs and exam to Dr. Melba Coon.  Per Dr. Melba Coon patient is has chronic hypertension and needs to begin Labetalol 100mg  bid at discharge.  Order given for U/S eval for bleeding and cramping. U/S results reported to Dr. Melba Coon.  Plan to discharge with pelvic rest and Begin Labetalol as directed Discussed lab, exam and U/S findings with the patient.    Assessment and Plan  A:  Vaginal bleeding in first trimester pregnancy      Abdominal pain in first trimester      Viable Intrauterine pregnancy at 24w5ds--approx 2 weeks behind dates by LMP      Small anterior uterine fibroid      Chronic Hypertension      Morbid obesity      Advanced Maternal Age  P: Rx sent to pharmacy for Labetalol      Instructed to have pelvic rest until bleeding resolves      Call office for worsening symptoms     Keep scheduled appt for continued OB care.       Waynetta Sandy Key 08/15/2017, 5:18 PM

## 2017-08-15 NOTE — MAU Note (Signed)
Pt presents to MAU with complaints of small amount of vaginal bleeding off since her ultrasound on Tuesday. Mild abdominal cramping

## 2017-09-03 LAB — OB RESULTS CONSOLE ANTIBODY SCREEN: ANTIBODY SCREEN: NEGATIVE

## 2017-09-03 LAB — OB RESULTS CONSOLE HEPATITIS B SURFACE ANTIGEN: Hepatitis B Surface Ag: NEGATIVE

## 2017-09-03 LAB — OB RESULTS CONSOLE GC/CHLAMYDIA
Chlamydia: NEGATIVE
Gonorrhea: NEGATIVE

## 2017-09-03 LAB — OB RESULTS CONSOLE HIV ANTIBODY (ROUTINE TESTING): HIV: NONREACTIVE

## 2017-09-03 LAB — OB RESULTS CONSOLE ABO/RH: RH TYPE: POSITIVE

## 2017-09-03 LAB — OB RESULTS CONSOLE RUBELLA ANTIBODY, IGM: Rubella: IMMUNE

## 2017-09-03 LAB — OB RESULTS CONSOLE RPR: RPR: NONREACTIVE

## 2017-11-09 ENCOUNTER — Encounter (HOSPITAL_COMMUNITY): Payer: Self-pay | Admitting: *Deleted

## 2017-11-09 ENCOUNTER — Inpatient Hospital Stay (HOSPITAL_COMMUNITY)
Admission: AD | Admit: 2017-11-09 | Discharge: 2017-11-09 | Disposition: A | Discharge status: Home / Self Care | Payer: Medicaid Other | Source: Ambulatory Visit | Attending: Obstetrics and Gynecology | Admitting: Obstetrics and Gynecology

## 2017-11-09 DIAGNOSIS — G43909 Migraine, unspecified, not intractable, without status migrainosus: Secondary | ICD-10-CM | POA: Insufficient documentation

## 2017-11-09 DIAGNOSIS — Z87891 Personal history of nicotine dependence: Secondary | ICD-10-CM | POA: Insufficient documentation

## 2017-11-09 DIAGNOSIS — R42 Dizziness and giddiness: Secondary | ICD-10-CM | POA: Diagnosis not present

## 2017-11-09 DIAGNOSIS — O1202 Gestational edema, second trimester: Secondary | ICD-10-CM | POA: Diagnosis present

## 2017-11-09 DIAGNOSIS — Z3A22 22 weeks gestation of pregnancy: Secondary | ICD-10-CM | POA: Insufficient documentation

## 2017-11-09 DIAGNOSIS — O26892 Other specified pregnancy related conditions, second trimester: Secondary | ICD-10-CM | POA: Insufficient documentation

## 2017-11-09 DIAGNOSIS — G43009 Migraine without aura, not intractable, without status migrainosus: Secondary | ICD-10-CM

## 2017-11-09 LAB — URINALYSIS, ROUTINE W REFLEX MICROSCOPIC
Glucose, UA: NEGATIVE mg/dL
HGB URINE DIPSTICK: NEGATIVE
KETONES UR: NEGATIVE mg/dL
LEUKOCYTES UA: NEGATIVE
Nitrite: NEGATIVE
PH: 5 (ref 5.0–8.0)
Protein, ur: 100 mg/dL — AB
Specific Gravity, Urine: 1.031 — ABNORMAL HIGH (ref 1.005–1.030)

## 2017-11-09 MED ORDER — DEXAMETHASONE 6 MG PO TABS
10.0000 mg | ORAL_TABLET | Freq: Once | ORAL | Status: AC
  Administered 2017-11-09: 10 mg via ORAL
  Filled 2017-11-09: qty 1

## 2017-11-09 MED ORDER — METOCLOPRAMIDE HCL 10 MG PO TABS
10.0000 mg | ORAL_TABLET | Freq: Once | ORAL | Status: AC
  Administered 2017-11-09: 10 mg via ORAL
  Filled 2017-11-09: qty 1

## 2017-11-09 MED ORDER — DEXAMETHASONE 6 MG PO TABS: 6.0000 mg | ORAL_TABLET | Freq: Once | ORAL | Status: DC

## 2017-11-09 MED ORDER — DIPHENHYDRAMINE HCL 25 MG PO CAPS
25.0000 mg | ORAL_CAPSULE | Freq: Once | ORAL | Status: AC
  Administered 2017-11-09: 25 mg via ORAL
  Filled 2017-11-09: qty 1

## 2017-11-09 NOTE — Discharge Instructions (Signed)
General Headache Without Cause A headache is pain or discomfort felt around the head or neck area. The specific cause of a headache may not be found. There are many causes and types of headaches. A few common ones are:  Tension headaches.  Migraine headaches.  Cluster headaches.  Chronic daily headaches.  Follow these instructions at home: Watch your condition for any changes. Take these steps to help with your condition: Managing pain  Take over-the-counter and prescription medicines only as told by your health care provider.  Lie down in a dark, quiet room when you have a headache.  If directed, apply ice to the head and neck area: ? Put ice in a plastic bag. ? Place a towel between your skin and the bag. ? Leave the ice on for 20 minutes, 2-3 times per day.  Use a heating pad or hot shower to apply heat to the head and neck area as told by your health care provider.  Keep lights dim if bright lights bother you or make your headaches worse. Eating and drinking  Eat meals on a regular schedule.  Limit alcohol use.  Decrease the amount of caffeine you drink, or stop drinking caffeine. General instructions  Keep all follow-up visits as told by your health care provider. This is important.  Keep a headache journal to help find out what may trigger your headaches. For example, write down: ? What you eat and drink. ? How much sleep you get. ? Any change to your diet or medicines.  Try massage or other relaxation techniques.  Limit stress.  Sit up straight, and do not tense your muscles.  Do not use tobacco products, including cigarettes, chewing tobacco, or e-cigarettes. If you need help quitting, ask your health care provider.  Exercise regularly as told by your health care provider.  Sleep on a regular schedule. Get 7-9 hours of sleep, or the amount recommended by your health care provider. Contact a health care provider if:  Your symptoms are not helped by  medicine.  You have a headache that is different from the usual headache.  You have nausea or you vomit.  You have a fever. Get help right away if:  Your headache becomes severe.  You have repeated vomiting.  You have a stiff neck.  You have a loss of vision.  You have problems with speech.  You have pain in the eye or ear.  You have muscular weakness or loss of muscle control.  You lose your balance or have trouble walking.  You feel faint or pass out.  You have confusion. This information is not intended to replace advice given to you by your health care provider. Make sure you discuss any questions you have with your health care provider. Document Released: 07/22/2005 Document Revised: 12/28/2015 Document Reviewed: 11/14/2014 Elsevier Interactive Patient Education  2018 [employer]  

## 2017-11-09 NOTE — MAU Provider Note (Signed)
Chief Complaint: Leg Swelling; Headache; Nausea; and Dizziness   First Provider Initiated Contact with Patient 11/09/17 2107      SUBJECTIVE HPI: Grace Fisher is a 42 y.o. [id number] at [redacted]w[redacted]d by LMP who presents to maternity admissions reporting that her legs are swollen, she has a headache, and she has had episodes of nausea and dizziness today.  She has hx of migraines and reports the h/a seem similar to those, with associated vision changes and photophobia.  She has had episodes of nausea and dizziness x 2-3 weeks. She lives in a tent currently with her s/o and does not have running water but does have large bottles of water for drinking and keeping clean. She reports she has had 1 small bottle of water today but that is because she does not like water.  Her feet and lower legs are swollen x 2 weeks, which is gradually worsening.  She has hx of HTN and was worried that her symptoms were related to high blood pressure.  There are no other associated symptoms. She has not tried any treatments.  She denies vaginal bleeding, vaginal itching/burning, urinary symptoms, or fever/chills.     HPI  Past Medical History:  Diagnosis Date  . Acid reflux   . Anxiety   . Asthma   . Bilateral carpal tunnel syndrome   . Bronchitis 11/2012  . Complication of anesthesia    Had post-op fever (100 - 101) and SOB after anesthesia  . Constipation, chronic   . Depression   . Family history of anesthesia complication    Son also has difficult time breathing after anesthesia  . Hypertension   . Insomnia   . Melanoma ([place])    on back and arms  . Mental disorder   . Migraines   . Obesity   . Panic attack as reaction to stress   . PONV (postoperative nausea and vomiting)   . Renal stones t  . Seasonal allergies   . Shingles   . Shortness of breath   . Urinary frequency    difficulty emptying bladder completely   Past Surgical History:  Procedure Laterality Date  . CHOLECYSTECTOMY    . CYSTOSCOPY  WITH STENT PLACEMENT Left 09/29/2015   Procedure: CYSTOSCOPY WITH STENT PLACEMENT;  Surgeon: Raynelle Bring, MD;  Location: [facility];  Service: Urology;  Laterality: Left;  Marland Kitchen MULTIPLE EXTRACTIONS WITH ALVEOLOPLASTY Bilateral 11/30/2012   Procedure: MULTIPLE EXTRACTION ;  Surgeon: Gae Bon, DDS;  Location: [facility];  Service: Oral Surgery;  Laterality: Bilateral;   Social History   Socioeconomic History  . Marital status: Single    Spouse name: Johnathon  . Number of children: 2  . Years of education: 12+  . Highest education level: Not on file  Occupational History  . Occupation: Agricultural engineer  Social Needs  . Financial resource strain: Not on file  . Food insecurity:    Worry: Not on file    Inability: Not on file  . Transportation needs:    Medical: Not on file    Non-medical: Not on file  Tobacco Use  . Smoking status: Former Smoker    Last attempt to quit: 11/09/1997    Years since quitting: 20.0  . Smokeless tobacco: Never Used  Substance and Sexual Activity  . Alcohol use: No  . Drug use: No  . Sexual activity: Yes    Comment: last sex past week  Lifestyle  . Physical activity:    Days per week: Not on  file    Minutes per session: Not on file  . Stress: Not on file  Relationships  . Social connections:    Talks on phone: Not on file    Gets together: Not on file    Attends religious service: Not on file    Active member of club or organization: Not on file    Attends meetings of clubs or organizations: Not on file    Relationship status: Not on file  . Intimate partner violence:    Fear of current or ex partner: Not on file    Emotionally abused: Not on file    Physically abused: Not on file    Forced sexual activity: Not on file  Other Topics Concern  . Not on file  Social History Narrative   Lives w/ fiance and 2 children   Caffeine use: 1-2 glasses per day (tea)   No current facility-administered medications on file prior to encounter.    Current Outpatient  Medications on File Prior to Encounter  Medication Sig Dispense Refill  . albuterol (PROVENTIL HFA;VENTOLIN HFA) 108 (90 BASE) MCG/ACT inhaler Inhale 2 puffs into the lungs every 6 (six) hours as needed for shortness of breath.     . busPIRone (BUSPAR) 10 MG tablet Take 10 mg by mouth 3 (three) times daily.    Marland Kitchen doxylamine, Sleep, (UNISOM) 25 MG tablet Take 25 mg by mouth at bedtime as needed for sleep.    Marland Kitchen Doxylamine-Pyridoxine (DICLEGIS) 10-10 MG TBEC Take 1-2 tablets by mouth 3 (three) times daily. 1 in the morning and afternoon, 2 at night    . labetalol (NORMODYNE) 100 MG tablet Take 1 tablet (100 mg total) by mouth 2 (two) times daily. (Patient taking differently: Take 200 mg by mouth 2 (two) times daily. ) 60 tablet 0  . loratadine (CLARITIN) 10 MG tablet Take 10 mg by mouth daily.    . Prenatal Vit-Fe Fumarate-FA (PRENATAL MULTIVITAMIN) TABS tablet Take 2 tablets by mouth daily at 12 noon.    . ranitidine (ZANTAC) 150 MG tablet Take 150 mg by mouth at bedtime.    . valACYclovir (VALTREX) 1000 MG tablet Take 1 tablet (1,000 mg total) by mouth 3 (three) times daily. (Patient not taking: Reported on 08/15/2017) 21 tablet 0  . zolpidem (AMBIEN) 10 MG tablet Take 10 mg by mouth at bedtime.     Allergies  Allergen Reactions  . Acetaminophen-Codeine Other (See Comments)    severe migraine headache  . Morphine Hives and Rash  . Penicillins Rash    Has patient had a PCN reaction causing immediate rash, facial/tongue/throat swelling, SOB or lightheadedness with hypotension: No Has patient had a PCN reaction causing severe rash involving mucus membranes or skin necrosis: No Has patient had a PCN reaction that required hospitalization No Has patient had a PCN reaction occurring within the last 10 years: No If all of the above answers are "NO", then may proceed with Cephalosporin use.  . Sulfa Antibiotics Palpitations    REACTION:  fever    ROS:  Review of Systems  Constitutional: Negative  for chills, fatigue and fever.  Eyes: Positive for visual disturbance.  Respiratory: Negative for shortness of breath.   Cardiovascular: Positive for leg swelling. Negative for chest pain.  Gastrointestinal: Negative for abdominal pain, nausea and vomiting.  Genitourinary: Negative for difficulty urinating, dysuria, flank pain, pelvic pain, vaginal bleeding, vaginal discharge and vaginal pain.  Neurological: Positive for dizziness and headaches.  Psychiatric/Behavioral: Negative.  I have reviewed patient's Past Medical Hx, Surgical Hx, Family Hx, Social Hx, medications and allergies.   Physical Exam   Patient Vitals for the past 24 hrs:  BP Temp Temp src Pulse Resp Height Weight  11/09/17 2034 127/63 98.4 F (36.9 C) Oral 79 16 - -  11/09/17 1928 - - - - - 5\' 3"  (1.6 m) (!) 310 lb (140.6 kg)   Constitutional: Well-developed, well-nourished female in no acute distress.  HEART: normal rate, heart sounds, regular rhythm RESP: normal effort, lung sounds clear and equal bilaterally GI: Abd soft, non-tender. Pos BS x 4 MS: Extremities nontender, 2+ nonpitting edema BLE, normal ROM Neurologic: Alert and oriented x 4.  GU: Neg CVAT.  PELVIC EXAM: Deferred  FHT 164 by doppler  LAB RESULTS  Results for orders placed or performed during the hospital encounter of 11/09/17 (from the past 72 hour(s))  Urinalysis, Routine w reflex microscopic     Status: Abnormal   Collection Time: 11/09/17  7:25 PM  Result Value Ref Range   Color, Urine AMBER (A) YELLOW    Comment: BIOCHEMICALS MAY BE AFFECTED BY COLOR   APPearance CLOUDY (A) CLEAR   Specific Gravity, Urine 1.031 (H) 1.005 - 1.030   pH 5.0 5.0 - 8.0   Glucose, UA NEGATIVE NEGATIVE mg/dL   Hgb urine dipstick NEGATIVE NEGATIVE   Bilirubin Urine SMALL (A) NEGATIVE   Ketones, ur NEGATIVE NEGATIVE mg/dL   Protein, ur 100 (A) NEGATIVE mg/dL   Nitrite NEGATIVE NEGATIVE   Leukocytes, UA NEGATIVE NEGATIVE   RBC / HPF 6-30 0 - 5  RBC/hpf   WBC, UA 6-30 0 - 5 WBC/hpf   Bacteria, UA MANY (A) NONE SEEN   Squamous Epithelial / LPF TOO NUMEROUS TO COUNT (A) NONE SEEN   Mucus PRESENT    Ca Oxalate Crys, UA PRESENT     Comment: Performed at [facility], [address], [place], [place] [address]  Culture, [place]rine     Status: Abnormal   Collection Time: 11/09/17  7:25 PM  Result Value Ref Range   Specimen Description      URINE, CLEAN CATCH Performed at [facility], [address] [place], [place] [address]    Special Requests      NONE Performed at [facility], [address], [place], [place] [address]    Culture (A)     MULTIPLE SPECIES PRESENT, SUGGEST RECOLLECTION NO GROUP B STREP (S.AGALACTIAE) ISOLATED Performed at [facility] [address] [place], [place] [address]    Report Status 11/11/2017 FINAL       IMAGING No results found.  MAU Management/MDM: UA with high specific gravity but no ketones.  Many bacteria but not a clean catch. Will send for urine culture. FHT wnl today BP wnl Pt mildly dehydrated and with likely migraine h/a.  Offered migraine cocktail via IV (LR, Reglan, Decadron, Benadryl) but pt knows she is a difficult IV stick so does not want IV.  Will do PO meds, Reglan, Decadron, Benadryl with pitcher of PO fluids Pt reports her prenatal labwork in the office was normal and she is not anemic during this pregnancy Pt with significant improvement in h/a and dizziness after medications and pitcher of water. Consult Dr Marvel Plan with assessment and findings. Pt discharged with strict return precautions.  ASSESSMENT  1. Migraine without aura and without status migrainosus, not intractable   2. [redacted] weeks gestation of pregnancy   3. Edema during pregnancy in second trimester  4. Dizziness    PLAN Discharge home Allergies as of 11/09/2017      Reactions   Acetaminophen-codeine Other (See Comments)   severe migraine headache   Morphine Hives, Rash    Penicillins Rash   Has patient had a PCN reaction causing immediate rash, facial/tongue/throat swelling, SOB or lightheadedness with hypotension: No Has patient had a PCN reaction causing severe rash involving mucus membranes or skin necrosis: No Has patient had a PCN reaction that required hospitalization No Has patient had a PCN reaction occurring within the last 10 years: No If all of the above answers are "NO", then may proceed with Cephalosporin use.   Sulfa Antibiotics Palpitations   REACTION:  fever      Medication List    TAKE these medications   albuterol 108 (90 Base) MCG/ACT inhaler Commonly known as:  PROVENTIL HFA;VENTOLIN HFA Inhale 2 puffs into the lungs every 6 (six) hours as needed for shortness of breath.   busPIRone 10 MG tablet Commonly known as:  BUSPAR Take 10 mg by mouth 3 (three) times daily.   DICLEGIS 10-10 MG Tbec Generic drug:  Doxylamine-Pyridoxine Take 1-2 tablets by mouth 3 (three) times daily. 1 in the morning and afternoon, 2 at night   doxylamine (Sleep) 25 MG tablet Commonly known as:  UNISOM Take 25 mg by mouth at bedtime as needed for sleep.   labetalol 100 MG tablet Commonly known as:  NORMODYNE Take 1 tablet (100 mg total) by mouth 2 (two) times daily. What changed:  how much to take   loratadine 10 MG tablet Commonly known as:  CLARITIN Take 10 mg by mouth daily.   prenatal multivitamin Tabs tablet Take 2 tablets by mouth daily at 12 noon.   ranitidine 150 MG tablet Commonly known as:  ZANTAC Take 150 mg by mouth at bedtime.   valACYclovir 1000 MG tablet Commonly known as:  VALTREX Take 1 tablet (1,000 mg total) by mouth 3 (three) times daily.   zolpidem 10 MG tablet Commonly known as:  AMBIEN Take 10 mg by mouth at bedtime.        Fatima Blank Certified Nurse-Midwife 11/09/2017  9:29 PM

## 2017-11-09 NOTE — MAU Note (Signed)
Pt states she lives in a tent with her FOB who does work and provides for groceries and gets her to all her appts. She states that she has more nausea than usual although taking diclegis.  She also states that she has dark urine, been dizzy and c/o a headache today. She reports drinking 1 bottle of water all day. Feels the baby moving and denies any vag bleeding or leaking.

## 2017-11-11 LAB — CULTURE, OB URINE

## 2017-12-26 ENCOUNTER — Encounter (HOSPITAL_COMMUNITY): Payer: Self-pay | Admitting: *Deleted

## 2017-12-26 ENCOUNTER — Inpatient Hospital Stay (HOSPITAL_COMMUNITY)
Admission: AD | Admit: 2017-12-26 | Discharge: 2017-12-26 | Disposition: A | Discharge status: Home / Self Care | Payer: Medicaid Other | Source: Ambulatory Visit | Attending: Obstetrics and Gynecology | Admitting: Obstetrics and Gynecology

## 2017-12-26 DIAGNOSIS — O212 Late vomiting of pregnancy: Secondary | ICD-10-CM | POA: Diagnosis present

## 2017-12-26 DIAGNOSIS — R51 Headache: Secondary | ICD-10-CM | POA: Diagnosis not present

## 2017-12-26 DIAGNOSIS — Z87891 Personal history of nicotine dependence: Secondary | ICD-10-CM | POA: Insufficient documentation

## 2017-12-26 DIAGNOSIS — O26892 Other specified pregnancy related conditions, second trimester: Secondary | ICD-10-CM

## 2017-12-26 DIAGNOSIS — O10912 Unspecified pre-existing hypertension complicating pregnancy, second trimester: Secondary | ICD-10-CM

## 2017-12-26 DIAGNOSIS — Z3A27 27 weeks gestation of pregnancy: Secondary | ICD-10-CM | POA: Diagnosis not present

## 2017-12-26 DIAGNOSIS — O219 Vomiting of pregnancy, unspecified: Secondary | ICD-10-CM

## 2017-12-26 DIAGNOSIS — O10012 Pre-existing essential hypertension complicating pregnancy, second trimester: Secondary | ICD-10-CM | POA: Diagnosis not present

## 2017-12-26 LAB — COMPREHENSIVE METABOLIC PANEL
ALT: 16 U/L (ref 14–54)
ANION GAP: 13 (ref 5–15)
AST: 18 U/L (ref 15–41)
Albumin: 3.3 g/dL — ABNORMAL LOW (ref 3.5–5.0)
Alkaline Phosphatase: 115 U/L (ref 38–126)
BUN: 8 mg/dL (ref 6–20)
CO2: 19 mmol/L — ABNORMAL LOW (ref 22–32)
Calcium: 10.6 mg/dL — ABNORMAL HIGH (ref 8.9–10.3)
Chloride: 103 mmol/L (ref 101–111)
Creatinine, Ser: 0.55 mg/dL (ref 0.44–1.00)
GFR calc non Af Amer: >60 mL/min (ref >60)
Glucose, Bld: 95 mg/dL (ref 65–99)
Potassium: 4 mmol/L (ref 3.5–5.1)
Sodium: 135 mmol/L (ref 135–145)
TOTAL PROTEIN: 6.5 g/dL (ref 6.5–8.1)
Total Bilirubin: 0.5 mg/dL (ref 0.3–1.2)

## 2017-12-26 LAB — CBC
HEMATOCRIT: 37.4 % (ref 36.0–46.0)
HEMOGLOBIN: 12.5 g/dL (ref 12.0–15.0)
MCH: 31.6 pg (ref 26.0–34.0)
MCHC: 33.4 g/dL (ref 30.0–36.0)
MCV: 94.7 fL (ref 78.0–100.0)
Platelets: 331 10*3/uL (ref 150–400)
RBC: 3.95 MIL/uL (ref 3.87–5.11)
RDW: 15 % (ref 11.5–15.5)
WBC: 10.6 10*3/uL — ABNORMAL HIGH (ref 4.0–10.5)

## 2017-12-26 LAB — URINALYSIS, ROUTINE W REFLEX MICROSCOPIC
Bilirubin Urine: NEGATIVE
GLUCOSE, UA: NEGATIVE mg/dL
HGB URINE DIPSTICK: NEGATIVE
Ketones, ur: 80 mg/dL — AB
LEUKOCYTES UA: NEGATIVE
NITRITE: NEGATIVE
PH: 6 (ref 5.0–8.0)
PROTEIN: 30 mg/dL — AB
Specific Gravity, Urine: 1.021 (ref 1.005–1.030)

## 2017-12-26 LAB — PROTEIN / CREATININE RATIO, URINE
Creatinine, Urine: 233 mg/dL
PROTEIN CREATININE RATIO: 0.08 mg/mg{creat} (ref 0.00–0.15)
TOTAL PROTEIN, URINE: 19 mg/dL

## 2017-12-26 MED ORDER — METOCLOPRAMIDE HCL 5 MG/ML IJ SOLN
10.0000 mg | Freq: Once | INTRAMUSCULAR | Status: AC
  Administered 2017-12-26: 10 mg via INTRAVENOUS
  Filled 2017-12-26: qty 2

## 2017-12-26 MED ORDER — LACTATED RINGERS IV BOLUS
1000.0000 mL | Freq: Once | INTRAVENOUS | Status: AC
  Administered 2017-12-26: 1000 mL via INTRAVENOUS

## 2017-12-26 MED ORDER — DEXAMETHASONE SODIUM PHOSPHATE 10 MG/ML IJ SOLN
10.0000 mg | Freq: Once | INTRAMUSCULAR | Status: AC
  Administered 2017-12-26: 10 mg via INTRAVENOUS
  Filled 2017-12-26: qty 1

## 2017-12-26 MED ORDER — DIPHENHYDRAMINE HCL 50 MG/ML IJ SOLN
25.0000 mg | Freq: Once | INTRAMUSCULAR | Status: AC
  Administered 2017-12-26: 25 mg via INTRAVENOUS
  Filled 2017-12-26: qty 1

## 2017-12-26 NOTE — MAU Provider Note (Signed)
History     CSN: [id number]  Arrival date and time: 12/26/17 1709   First Provider Initiated Contact with Patient 12/26/17 1815      Chief Complaint  Patient presents with  . Abdominal Pain  . Emesis  . Nausea   HPI  Grace Fisher is a 42 y.o. [id number] at [redacted]w[redacted]d who presents from n/v. Reports she has felt bad since earlier this week when she did her blood sugar test in the office. Reports vomiting 10 times today; more recently has been dry heaving. No sick contacts. Denies diarrhea or fever/chills. Has taken diclegis without relief. Has hx of migraines. Reports headache today that won't go away. Rates pain 7/10. Took tylenol with no relief. Endorses white spots in vision with headache. No epigastric pain. Has chronic hypertension; is taking labetalol 200 mg BID: last took this morning. Positive fetal movement.   OB History    Gravida  4   Para  2   Term  2   Preterm      AB  1   Living  2     SAB      TAB  1   Ectopic      Multiple      Live Births  2           Past Medical History:  Diagnosis Date  . Acid reflux   . Anxiety   . Asthma   . Bilateral carpal tunnel syndrome   . Bronchitis 11/2012  . Complication of anesthesia    Had post-op fever (100 - 101) and SOB after anesthesia  . Constipation, chronic   . Depression   . Family history of anesthesia complication    Son also has difficult time breathing after anesthesia  . Hypertension   . Insomnia   . Melanoma ([place])    on back and arms  . Mental disorder   . Migraines   . Obesity   . Panic attack as reaction to stress   . PONV (postoperative nausea and vomiting)   . Renal stones t  . Seasonal allergies   . Shingles   . Shortness of breath   . Urinary frequency    difficulty emptying bladder completely    Past Surgical History:  Procedure Laterality Date  . CHOLECYSTECTOMY    . CYSTOSCOPY WITH STENT PLACEMENT Left 09/29/2015   Procedure: CYSTOSCOPY WITH STENT PLACEMENT;  Surgeon:  Raynelle Bring, MD;  Location: [facility];  Service: Urology;  Laterality: Left;  Marland Kitchen MULTIPLE EXTRACTIONS WITH ALVEOLOPLASTY Bilateral 11/30/2012   Procedure: MULTIPLE EXTRACTION ;  Surgeon: Gae Bon, DDS;  Location: [facility];  Service: Oral Surgery;  Laterality: Bilateral;    Family History  Problem Relation Age of Onset  . Coronary artery disease Father   . Migraines Neg Hx     Social History   Tobacco Use  . Smoking status: Former Smoker    Last attempt to quit: 11/09/1997    Years since quitting: 20.1  . Smokeless tobacco: Never Used  Substance Use Topics  . Alcohol use: No  . Drug use: No    Allergies:  Allergies  Allergen Reactions  . Acetaminophen-Codeine Other (See Comments)    severe migraine headache  . Morphine Hives and Rash  . Penicillins Rash    Has patient had a PCN reaction causing immediate rash, facial/tongue/throat swelling, SOB or lightheadedness with hypotension: No Has patient had a PCN reaction causing severe rash involving mucus membranes or skin necrosis: No  Has patient had a PCN reaction that required hospitalization No Has patient had a PCN reaction occurring within the last 10 years: No If all of the above answers are "NO", then may proceed with Cephalosporin use.  . Sulfa Antibiotics Palpitations    REACTION:  fever    Medications Prior to Admission  Medication Sig Dispense Refill Last Dose  . albuterol (PROVENTIL HFA;VENTOLIN HFA) 108 (90 BASE) MCG/ACT inhaler Inhale 2 puffs into the lungs every 6 (six) hours as needed for shortness of breath.    Past Month at Unknown time  . busPIRone (BUSPAR) 10 MG tablet Take 10 mg by mouth 3 (three) times daily.   11/09/2017 at Unknown time  . doxylamine, Sleep, (UNISOM) 25 MG tablet Take 25 mg by mouth at bedtime as needed for sleep.   Past Month at Unknown time  . Doxylamine-Pyridoxine (DICLEGIS) 10-10 MG TBEC Take 1-2 tablets by mouth 3 (three) times daily. 1 in the morning and afternoon, 2 at night   11/09/2017  at Unknown time  . labetalol (NORMODYNE) 100 MG tablet Take 1 tablet (100 mg total) by mouth 2 (two) times daily. (Patient taking differently: Take 200 mg by mouth 2 (two) times daily. ) 60 tablet 0 11/09/2017 at Unknown time  . loratadine (CLARITIN) 10 MG tablet Take 10 mg by mouth daily.   11/08/2017 at Unknown time  . Prenatal Vit-Fe Fumarate-FA (PRENATAL MULTIVITAMIN) TABS tablet Take 2 tablets by mouth daily at 12 noon.   11/08/2017 at Unknown time  . ranitidine (ZANTAC) 150 MG tablet Take 150 mg by mouth at bedtime.   11/09/2017 at Unknown time  . valACYclovir (VALTREX) 1000 MG tablet Take 1 tablet (1,000 mg total) by mouth 3 (three) times daily. (Patient not taking: Reported on 08/15/2017) 21 tablet 0 More than a month at Unknown time  . zolpidem (AMBIEN) 10 MG tablet Take 10 mg by mouth at bedtime.   Not Taking at Unknown time    Review of Systems  Constitutional: Negative.   Gastrointestinal: Positive for abdominal pain, nausea and vomiting. Negative for diarrhea.  Genitourinary: Negative.   Neurological: Positive for headaches.   Physical Exam   Blood pressure 128/70, pulse 91, temperature 99 F (37.2 C), temperature source Oral, resp. rate 18, height 5\' 3"  (1.6 m), weight 295 lb (133.8 kg), last menstrual period 06/07/2017, SpO2 99 %. Patient Vitals for the past 24 hrs:  BP Temp Temp src Pulse Resp SpO2 Height Weight  12/26/17 1933 128/70 - - 91 - - - -  12/26/17 1917 (!) 127/94 - - 97 - - - -  12/26/17 1902 (!) 145/86 - - 92 - - - -  12/26/17 1847 126/83 - - 90 - - - -  12/26/17 1831 (!) 132/98 - - 99 - - - -  12/26/17 1735 (!) 141/69 99 F (37.2 C) Oral 97 18 99 % 5\' 3"  (1.6 m) 295 lb (133.8 kg)    Physical Exam  Nursing note and vitals reviewed. Constitutional: She is oriented to person, place, and time. She appears well-developed and well-nourished. No distress.  HENT:  Head: Normocephalic and atraumatic.  Eyes: Conjunctivae are normal. Right eye exhibits no discharge. Left  eye exhibits no discharge. No scleral icterus.  Neck: Normal range of motion.  Cardiovascular: Normal rate, regular rhythm and normal heart sounds.  No murmur heard. Respiratory: Effort normal and breath sounds normal. No respiratory distress. She has no wheezes.  GI: Soft. There is no tenderness.  Neurological: She is  alert and oriented to person, place, and time. She has normal reflexes.  No clonus  Skin: Skin is warm and dry. She is not diaphoretic.  Psychiatric: She has a normal mood and affect. Her behavior is normal. Judgment and thought content normal.    MAU Course  Procedures Results for orders placed or performed during the hospital encounter of 12/26/17 (from the past 24 hour(s))  Urinalysis, Routine w reflex microscopic     Status: Abnormal   Collection Time: 12/26/17  5:37 PM  Result Value Ref Range   Color, Urine YELLOW YELLOW   APPearance CLOUDY (A) CLEAR   Specific Gravity, Urine 1.021 1.005 - 1.030   pH 6.0 5.0 - 8.0   Glucose, UA NEGATIVE NEGATIVE mg/dL   Hgb urine dipstick NEGATIVE NEGATIVE   Bilirubin Urine NEGATIVE NEGATIVE   Ketones, ur 80 (A) NEGATIVE mg/dL   Protein, ur 30 (A) NEGATIVE mg/dL   Nitrite NEGATIVE NEGATIVE   Leukocytes, UA NEGATIVE NEGATIVE   RBC / HPF 0-5 0 - 5 RBC/hpf   WBC, UA 0-5 0 - 5 WBC/hpf   Bacteria, UA MANY (A) NONE SEEN   Squamous Epithelial / LPF 21-50 0 - 5   Mucus PRESENT    Ca Oxalate Crys, UA PRESENT   Protein / creatinine ratio, urine     Status: None   Collection Time: 12/26/17  5:37 PM  Result Value Ref Range   Creatinine, Urine 233.00 mg/dL   Total Protein, Urine 19 mg/dL   Protein Creatinine Ratio 0.08 0.00 - 0.15 mg/mg[Cre]  CBC     Status: Abnormal   Collection Time: 12/26/17  6:37 PM  Result Value Ref Range   WBC 10.6 (H) 4.0 - 10.5 K/uL   RBC 3.95 3.87 - 5.11 MIL/uL   Hemoglobin 12.5 12.0 - 15.0 g/dL   HCT 37.4 36.0 - 46.0 %   MCV 94.7 78.0 - 100.0 fL   MCH 31.6 26.0 - 34.0 pg   MCHC 33.4 30.0 - 36.0 g/dL    RDW 15.0 11.5 - 15.5 %   Platelets 331 150 - 400 K/uL  Comprehensive metabolic panel     Status: Abnormal   Collection Time: 12/26/17  6:37 PM  Result Value Ref Range   Sodium 135 135 - 145 mmol/L   Potassium 4.0 3.5 - 5.1 mmol/L   Chloride 103 101 - 111 mmol/L   CO2 19 (L) 22 - 32 mmol/L   Glucose, Bld 95 65 - 99 mg/dL   BUN 8 6 - 20 mg/dL   Creatinine, Ser 0.55 0.44 - 1.00 mg/dL   Calcium 10.6 (H) 8.9 - 10.3 mg/dL   Total Protein 6.5 6.5 - 8.1 g/dL   Albumin 3.3 (L) 3.5 - 5.0 g/dL   AST 18 15 - 41 U/L   ALT 16 14 - 54 U/L   Alkaline Phosphatase 115 38 - 126 U/L   Total Bilirubin 0.5 0.3 - 1.2 mg/dL   GFR calc non Af Amer >60 >60 mL/min   GFR calc Af Amer >60 >60 mL/min   Anion gap 13 5 - 15    MDM NST:  Baseline: 155 bpm, Variability: Good {> 6 bpm), Accelerations: Non-reactive but appropriate for gestational age, Decelerations: Absent and no contractions   Elevated BPs; none severe range. PEC labs wnl  Headache cocktail given for headache and nausea --- headache improved and able to keep down oral fluids IV fluid bolus  S/w Dr. Terri Piedra. Notified of presentation, labs, BPs, and treatment. Ok  to discharge home.  Assessment and Plan  A: 1. Nausea and vomiting during pregnancy   2. [redacted] weeks gestation of pregnancy   3. Pregnancy headache in second trimester   4. Maternal chronic hypertension in second trimester    P: Discharge home Has phenergan at home, instructed to take it prn n/v Discussed reasons to return to MAU F/u with OB  Jorje Guild 12/26/2017, 6:15 PM

## 2017-12-26 NOTE — MAU Note (Signed)
Pt reports she has been dehydrated, feels nauseated, dry heaves. Lower abd pain

## 2017-12-26 NOTE — Discharge Instructions (Signed)
Hypertension During Pregnancy °Hypertension, commonly called high blood pressure, is when the force of blood pumping through your arteries is too strong. Arteries are blood vessels that carry blood from the heart throughout the body. Hypertension during pregnancy can cause problems for you and your baby. Your baby may be born early (prematurely) or may not weigh as much as he or she should at birth. Very bad cases of hypertension during pregnancy can be life-threatening. °Different types of hypertension can occur during pregnancy. These include: °· Chronic hypertension. This happens when: °? You have hypertension before pregnancy and it continues during pregnancy. °? You develop hypertension before you are [redacted] weeks pregnant, and it continues during pregnancy. °· Gestational hypertension. This is hypertension that develops after the 20th week of pregnancy. °· Preeclampsia, also called toxemia of pregnancy. This is a very serious type of hypertension that develops only during pregnancy. It affects the whole body, and it can be very dangerous for you and your baby. ° °Gestational hypertension and preeclampsia usually go away within 6 weeks after your baby is born. Women who have hypertension during pregnancy have a greater chance of developing hypertension later in life or during future pregnancies. °What are the causes? °The exact cause of hypertension is not known. °What increases the risk? °There are certain factors that make it more likely for you to develop hypertension during pregnancy. These include: °· Having hypertension during a previous pregnancy or prior to pregnancy. °· Being overweight. °· Being older than age 40. °· Being pregnant for the first time or being pregnant with more than one baby. °· Becoming pregnant using fertilization methods such as IVF (in vitro fertilization). °· Having diabetes, kidney problems, or systemic lupus erythematosus. °· Having a family history of hypertension. ° °What are the  signs or symptoms? °Chronic hypertension and gestational hypertension rarely cause symptoms. Preeclampsia causes symptoms, which may include: °· Increased protein in your urine. Your health care provider will check for this at every visit before you give birth (prenatal visit). °· Severe headaches. °· Sudden weight gain. °· Swelling of the hands, face, legs, and feet. °· Nausea and vomiting. °· Vision problems, such as blurred or double vision. °· Numbness in the face, arms, legs, and feet. °· Dizziness. °· Slurred speech. °· Sensitivity to bright lights. °· Abdominal pain. °· Convulsions. ° °How is this diagnosed? °You may be diagnosed with hypertension during a routine prenatal exam. At each prenatal visit, you may: °· Have a urine test to check for high amounts of protein in your urine. °· Have your blood pressure checked. A blood pressure reading is recorded as two numbers, such as "120 over 80" (or 120/80). The first ("top") number is called the systolic pressure. It is a measure of the pressure in your arteries when your heart beats. The second ("bottom") number is called the diastolic pressure. It is a measure of the pressure in your arteries as your heart relaxes between beats. Blood pressure is measured in a unit called mm Hg. A normal blood pressure reading is: °? Systolic: below 120. °? Diastolic: below 80. ° °The type of hypertension that you are diagnosed with depends on your test results and when your symptoms developed. °· Chronic hypertension is usually diagnosed before 20 weeks of pregnancy. °· Gestational hypertension is usually diagnosed after 20 weeks of pregnancy. °· Hypertension with high amounts of protein in the urine is diagnosed as preeclampsia. °· Blood pressure measurements that stay above 160 systolic, or above 110 diastolic, are   signs of severe preeclampsia. ° °How is this treated? °Treatment for hypertension during pregnancy varies depending on the type of hypertension you have and how  serious it is. °· If you take medicines called ACE inhibitors to treat chronic hypertension, you may need to switch medicines. ACE inhibitors should not be taken during pregnancy. °· If you have gestational hypertension, you may need to take blood pressure medicine. °· If you are at risk for preeclampsia, your health care provider may recommend that you take a low-dose aspirin every day to prevent high blood pressure during your pregnancy. °· If you have severe preeclampsia, you may need to be hospitalized so you and your baby can be monitored closely. You may also need to take medicine (magnesium sulfate) to prevent seizures and to lower blood pressure. This medicine may be given as an injection or through an IV tube. °· In some cases, if your condition gets worse, you may need to deliver your baby early. ° °Follow these instructions at home: °Eating and drinking °· Drink enough fluid to keep your urine clear or pale yellow. °· Eat a healthy diet that is low in salt (sodium). Do not add salt to your food. Check food labels to see how much sodium a food or beverage contains. °Lifestyle °· Do not use any products that contain nicotine or tobacco, such as cigarettes and e-cigarettes. If you need help quitting, ask your health care provider. °· Do not use alcohol. °· Avoid caffeine. °· Avoid stress as much as possible. Rest and get plenty of sleep. °General instructions °· Take over-the-counter and prescription medicines only as told by your health care provider. °· While lying down, lie on your left side. This keeps pressure off your baby. °· While sitting or lying down, raise (elevate) your feet. Try putting some pillows under your lower legs. °· Exercise regularly. Ask your health care provider what kinds of exercise are best for you. °· Keep all prenatal and follow-up visits as told by your health care provider. This is important. °Contact a health care provider if: °· You have symptoms that your health care  provider told you may require more treatment or monitoring, such as: °? Fever. °? Vomiting. °? Headache. °Get help right away if: °· You have severe abdominal pain or vomiting that does not get better with treatment. °· You suddenly develop swelling in your hands, ankles, or face. °· You gain 4 lbs (1.8 kg) or more in 1 week. °· You develop vaginal bleeding, or you have blood in your urine. °· You do not feel your baby moving as much as usual. °· You have blurred or double vision. °· You have muscle twitching or sudden tightening (spasms). °· You have shortness of breath. °· Your lips or fingernails turn blue. °This information is not intended to replace advice given to you by your health care provider. Make sure you discuss any questions you have with your health care provider. °Document Released: 04/09/2011 Document Revised: 02/09/2016 Document Reviewed: 01/05/2016 °Elsevier Interactive Patient Education © 2018 [employer] ° °

## 2018-01-14 ENCOUNTER — Encounter: Payer: Medicaid Other | Attending: Obstetrics and Gynecology | Admitting: Registered"

## 2018-01-14 DIAGNOSIS — O24419 Gestational diabetes mellitus in pregnancy, unspecified control: Secondary | ICD-10-CM | POA: Insufficient documentation

## 2018-01-14 DIAGNOSIS — O99810 Abnormal glucose complicating pregnancy: Secondary | ICD-10-CM

## 2018-01-14 DIAGNOSIS — Z713 Dietary counseling and surveillance: Secondary | ICD-10-CM | POA: Insufficient documentation

## 2018-01-20 ENCOUNTER — Encounter: Payer: Self-pay | Admitting: Registered"

## 2018-01-20 DIAGNOSIS — O99810 Abnormal glucose complicating pregnancy: Secondary | ICD-10-CM | POA: Insufficient documentation

## 2018-01-20 NOTE — Progress Notes (Signed)
Patient was seen on 01/14/2018 for Gestational Diabetes self-management class at the [facility]. The following learning objectives were met by the patient during this course:   States the definition of Gestational Diabetes  States why dietary management is important in controlling blood glucose  Describes the effects each nutrient has on blood glucose levels  Demonstrates ability to create a balanced meal plan  Demonstrates carbohydrate counting   States when to check blood glucose levels  Demonstrates proper blood glucose monitoring techniques  States the effect of stress and exercise on blood glucose levels  States the importance of limiting caffeine and abstaining from alcohol and smoking  Blood glucose monitor given: Accu-chek guide Lot # [id number] Exp: 03/25/18 Blood glucose reading: n/a pt pulled out lancet drum before testing  Patient instructed to monitor glucose levels: FBS: 60 - <95; 1 hour: <140; 2 hour: <120  Patient received handouts:  Nutrition Diabetes and Pregnancy, including carb counting list  Patient will be seen for follow-up as needed.

## 2018-02-12 ENCOUNTER — Other Ambulatory Visit (HOSPITAL_COMMUNITY): Payer: Self-pay

## 2018-02-14 ENCOUNTER — Inpatient Hospital Stay (HOSPITAL_COMMUNITY)
Admission: AD | Admit: 2018-02-14 | Discharge: 2018-02-14 | Disposition: A | Discharge status: Home / Self Care | Payer: Medicaid Other | Source: Ambulatory Visit | Attending: Obstetrics and Gynecology | Admitting: Obstetrics and Gynecology

## 2018-02-14 ENCOUNTER — Encounter (HOSPITAL_COMMUNITY): Payer: Self-pay

## 2018-02-14 ENCOUNTER — Other Ambulatory Visit: Payer: Self-pay

## 2018-02-14 DIAGNOSIS — Z3A35 35 weeks gestation of pregnancy: Secondary | ICD-10-CM | POA: Insufficient documentation

## 2018-02-14 DIAGNOSIS — Z888 Allergy status to other drugs, medicaments and biological substances status: Secondary | ICD-10-CM | POA: Insufficient documentation

## 2018-02-14 DIAGNOSIS — Z0371 Encounter for suspected problem with amniotic cavity and membrane ruled out: Secondary | ICD-10-CM

## 2018-02-14 DIAGNOSIS — Z87891 Personal history of nicotine dependence: Secondary | ICD-10-CM | POA: Diagnosis not present

## 2018-02-14 DIAGNOSIS — Z880 Allergy status to penicillin: Secondary | ICD-10-CM | POA: Diagnosis not present

## 2018-02-14 DIAGNOSIS — Z8249 Family history of ischemic heart disease and other diseases of the circulatory system: Secondary | ICD-10-CM | POA: Diagnosis not present

## 2018-02-14 DIAGNOSIS — R109 Unspecified abdominal pain: Secondary | ICD-10-CM | POA: Diagnosis not present

## 2018-02-14 DIAGNOSIS — O09523 Supervision of elderly multigravida, third trimester: Secondary | ICD-10-CM | POA: Insufficient documentation

## 2018-02-14 DIAGNOSIS — Z882 Allergy status to sulfonamides status: Secondary | ICD-10-CM | POA: Diagnosis not present

## 2018-02-14 DIAGNOSIS — O99213 Obesity complicating pregnancy, third trimester: Secondary | ICD-10-CM | POA: Insufficient documentation

## 2018-02-14 DIAGNOSIS — Z9049 Acquired absence of other specified parts of digestive tract: Secondary | ICD-10-CM | POA: Insufficient documentation

## 2018-02-14 DIAGNOSIS — Z885 Allergy status to narcotic agent status: Secondary | ICD-10-CM | POA: Insufficient documentation

## 2018-02-14 DIAGNOSIS — Z87442 Personal history of urinary calculi: Secondary | ICD-10-CM | POA: Insufficient documentation

## 2018-02-14 DIAGNOSIS — E669 Obesity, unspecified: Secondary | ICD-10-CM | POA: Diagnosis not present

## 2018-02-14 DIAGNOSIS — O26893 Other specified pregnancy related conditions, third trimester: Secondary | ICD-10-CM | POA: Diagnosis not present

## 2018-02-14 DIAGNOSIS — N898 Other specified noninflammatory disorders of vagina: Secondary | ICD-10-CM | POA: Diagnosis present

## 2018-02-14 DIAGNOSIS — Z85820 Personal history of malignant melanoma of skin: Secondary | ICD-10-CM | POA: Insufficient documentation

## 2018-02-14 LAB — URINALYSIS, ROUTINE W REFLEX MICROSCOPIC
BILIRUBIN URINE: NEGATIVE
Glucose, UA: NEGATIVE mg/dL
Hgb urine dipstick: NEGATIVE
KETONES UR: NEGATIVE mg/dL
Leukocytes, UA: NEGATIVE
NITRITE: NEGATIVE
Protein, ur: NEGATIVE mg/dL
SPECIFIC GRAVITY, URINE: 1.021 (ref 1.005–1.030)
pH: 6 (ref 5.0–8.0)

## 2018-02-14 LAB — AMNISURE RUPTURE OF MEMBRANE (ROM) NOT AT ARMC: AMNISURE: NEGATIVE

## 2018-02-14 NOTE — MAU Provider Note (Signed)
History   4P2012 at 35 weeks states was housecleaning today and had 3 episodes of loss of fluid there was no leaking in between these episodes normally any leaking afterwards.  She denies any vaginal bleeding states good fetal movement.  CSN: [id number]  Arrival date & time 02/14/18  1540   None     Chief Complaint  Patient presents with  . Rupture of Membranes  . Headache  . Contractions    HPI  Past Medical History:  Diagnosis Date  . Acid reflux   . Anxiety   . Asthma   . Bilateral carpal tunnel syndrome   . Bronchitis 11/2012  . Complication of anesthesia    Had post-op fever (100 - 101) and SOB after anesthesia  . Constipation, chronic   . Depression   . Family history of anesthesia complication    Son also has difficult time breathing after anesthesia  . Hypertension   . Insomnia   . Melanoma ([place])    on back and arms  . Mental disorder   . Migraines   . Obesity   . Panic attack as reaction to stress   . PONV (postoperative nausea and vomiting)   . Renal stones t  . Seasonal allergies   . Shingles   . Shortness of breath   . Urinary frequency    difficulty emptying bladder completely    Past Surgical History:  Procedure Laterality Date  . CHOLECYSTECTOMY    . CYSTOSCOPY WITH STENT PLACEMENT Left 09/29/2015   Procedure: CYSTOSCOPY WITH STENT PLACEMENT;  Surgeon: Raynelle Bring, MD;  Location: [facility];  Service: Urology;  Laterality: Left;  Marland Kitchen MULTIPLE EXTRACTIONS WITH ALVEOLOPLASTY Bilateral 11/30/2012   Procedure: MULTIPLE EXTRACTION ;  Surgeon: Gae Bon, DDS;  Location: [facility];  Service: Oral Surgery;  Laterality: Bilateral;    Family History  Problem Relation Age of Onset  . Coronary artery disease Father   . Migraines Neg Hx     Social History   Tobacco Use  . Smoking status: Former Smoker    Last attempt to quit: 11/09/1997    Years since quitting: 20.2  . Smokeless tobacco: Never Used  Substance Use Topics  . Alcohol use: No  . Drug use:  No    OB History    Gravida  4   Para  2   Term  2   Preterm      AB  1   Living  2     SAB      TAB  1   Ectopic      Multiple      Live Births  2           Review of Systems  Constitutional: Negative.   HENT: Negative.   Eyes: Negative.   Respiratory: Negative.   Cardiovascular: Negative.   Gastrointestinal: Positive for abdominal pain.  Endocrine: Negative.   Genitourinary: Negative.   Musculoskeletal: Negative.   Skin: Negative.   Allergic/Immunologic: Negative.   Neurological: Negative.   Hematological: Negative.   Psychiatric/Behavioral: Negative.     Allergies  Prednisone; Acetaminophen-codeine; Morphine; Penicillins; and Sulfa antibiotics  Home Medications    BP (!) 148/61 (BP Location: Right Arm)   Pulse 89   Temp 98 F (36.7 C) (Oral)   Resp 20   Wt (!) 300 lb 1.3 oz (136.1 kg)   LMP 06/07/2017   SpO2 98% Comment: ra  BMI 53.16 kg/m   Physical Exam  Constitutional: She appears well-developed  and well-nourished.  HENT:  Head: Normocephalic.  Neck: Normal range of motion.  Cardiovascular: Normal rate, regular rhythm, normal heart sounds and intact distal pulses.  Pulmonary/Chest: Effort normal and breath sounds normal.  Abdominal: Soft. Bowel sounds are normal.  Musculoskeletal: Normal range of motion.  Neurological: She is alert. She has normal strength.  Skin: Skin is warm and dry.  Psychiatric: She has a normal mood and affect. Her behavior is normal.    MAU Course  Procedures (including critical care time)  Labs Reviewed  URINALYSIS, [place] RUPTURE OF MEMBRANE (ROM) NOT AT [facility]   No results found.   1. Encounter for suspected PROM, with rupture of membranes not found       MDM  Vital signs stable.  AmniSure neg .  Fetal heart rate baseline 130s moderate variability no decelerations.  SVE done cervix is posterior closed long thick and high. POC discussed with Dr. Melba Coon will d/c  home.

## 2018-02-14 NOTE — Progress Notes (Addendum)
Ordered for [employer].  1710  Provider at bs assessing. amnisure collected and SVE done. Closed thick high.   1712: amnisure to lab.   1712: G4P2 @ 35. Wksga. Here dt r/o SROM. States had couple of big gushes. Denies bleeding. +FM  EFM applied.  Serial BP done   Juice provided and tilted pt to right.   OB hx GDM no meds Weekly NST but gets U/S instead for past month  Grossly Obese AMA 42 y/o  Provider made aware of the negative amnisure. Ordered to d/c pt home.   1810 D/c instructions given with pt understanding. Pt left unit via ambulatory with SO.

## 2018-02-14 NOTE — MAU Note (Signed)
Urine in lab 

## 2018-02-20 LAB — OB RESULTS CONSOLE GBS: STREP GROUP B AG: NEGATIVE

## 2018-03-03 ENCOUNTER — Encounter (HOSPITAL_COMMUNITY): Payer: Self-pay | Admitting: *Deleted

## 2018-03-03 ENCOUNTER — Telehealth (HOSPITAL_COMMUNITY): Payer: Self-pay | Admitting: *Deleted

## 2018-03-03 NOTE — Telephone Encounter (Signed)
Preadmission screen  

## 2018-03-04 ENCOUNTER — Encounter (HOSPITAL_COMMUNITY): Payer: Self-pay

## 2018-03-04 DIAGNOSIS — O133 Gestational [pregnancy-induced] hypertension without significant proteinuria, third trimester: Secondary | ICD-10-CM

## 2018-03-04 DIAGNOSIS — O99343 Other mental disorders complicating pregnancy, third trimester: Secondary | ICD-10-CM

## 2018-03-04 DIAGNOSIS — O99213 Obesity complicating pregnancy, third trimester: Secondary | ICD-10-CM

## 2018-03-04 DIAGNOSIS — O24419 Gestational diabetes mellitus in pregnancy, unspecified control: Secondary | ICD-10-CM

## 2018-03-04 HISTORY — DX: Obesity complicating pregnancy, third trimester: O99.213

## 2018-03-04 HISTORY — DX: Gestational diabetes mellitus in pregnancy, unspecified control: O24.419

## 2018-03-04 HISTORY — DX: Gestational (pregnancy-induced) hypertension without significant proteinuria, third trimester: O13.3

## 2018-03-04 HISTORY — DX: Other mental disorders complicating pregnancy, third trimester: O99.343

## 2018-03-04 MED ORDER — MISOPROSTOL 25 MCG QUARTER TABLET
25.0000 ug | ORAL_TABLET | Freq: Three times a day (TID) | ORAL | Status: DC | PRN
  Filled 2018-03-04 (×2): qty 1

## 2018-03-04 MED ORDER — MISOPROSTOL 25 MCG QUARTER TABLET: 25.0000 ug | ORAL_TABLET | Freq: Three times a day (TID) | ORAL | Status: DC | PRN

## 2018-03-04 NOTE — H&P (Signed)
Grace Fisher is a 42 y.o. female [phone] at 80+ with PIH, morbid obesity, GDM, depression, anxiety, AMA for IOL.  Pt's cervix is 1-1.5cm.50/-2-3 by SVE.  Pt also has some difficulty with social situation and will have social work consult.  Diabetes fairly well- controlled with metformin, still has elevated fasting, but well-controlled during day.  PIH with elevated 24hr protein - also HA and sx's.  Also with depression and anxiety.  Pt also AMA - with normal panorama.  D/W pt plan of care for IOL.  Pt takes labetalol 200mg  bid, BP well-controlled until recently.  Early [place] dates pregnancy.  OB History    Gravida  4   Para  2   Term  2   Preterm      AB  1   Living  2     SAB      TAB  1   Ectopic      Multiple      Live Births  2         TAB SVD x 2, female x 2; 5.6-7.6# + abn pap - last 6/18 No STD  Past Medical History:  Diagnosis Date  . Acid reflux   . AMA (advanced maternal age) multigravida 74+   . Anxiety   . Asthma   . Bilateral carpal tunnel syndrome   . Bronchitis 11/2012  . Complication of anesthesia    Had post-op fever (100 - 101) and SOB after anesthesia  . Constipation, chronic   . Depression   . Family history of anesthesia complication    Son also has difficult time breathing after anesthesia  . Gestational diabetes    metformin  . Gestational diabetes mellitus (GDM) affecting fourth pregnancy 03/04/2018  . Hypertension   . Insomnia   . Maternal obesity syndrome, antepartum, third trimester 03/04/2018  . Melanoma ([place])    on back and arms  . Mental disorder   . Mental disorder affecting pregnancy in third trimester 03/04/2018  . Migraines   . Obesity   . Panic attack as reaction to stress   . PIH (pregnancy induced hypertension), third trimester 03/04/2018  . PONV (postoperative nausea and vomiting)   . Renal stones t  . Seasonal allergies   . Shingles   . Shortness of breath   . Urinary frequency    difficulty emptying bladder  completely   Past Surgical History:  Procedure Laterality Date  . CHOLECYSTECTOMY    . CYSTOSCOPY WITH STENT PLACEMENT Left 09/29/2015   Procedure: CYSTOSCOPY WITH STENT PLACEMENT;  Surgeon: Raynelle Bring, MD;  Location: [facility];  Service: Urology;  Laterality: Left;  Marland Kitchen MULTIPLE EXTRACTIONS WITH ALVEOLOPLASTY Bilateral 11/30/2012   Procedure: MULTIPLE EXTRACTION ;  Surgeon: Gae Bon, DDS;  Location: [facility];  Service: Oral Surgery;  Laterality: Bilateral;  . WISDOM TOOTH EXTRACTION     Family History: family history includes Coronary artery disease in her father; Diabetes in her father; Heart disease in her father. Social History:  reports that she quit smoking about 20 years ago. She has never used smokeless tobacco. She reports that she does not drink alcohol or use drugs.single in long-term relationship.  Some housing issues.  Works as Biomedical engineer albuterol, buspar, butalbital, fiorcet, flexeril, flonase, labetalol, claritin, metformin, singulair, protonix, sertraline  All: morphine, PCN, prednisone, sulfa     Maternal Diabetes: Yes:  Diabetes Type:  Insulin/Medication controlled Genetic Screening: Normal Maternal Ultrasounds/Referrals: Normal Fetal Ultrasounds or other Referrals:  None Maternal  Substance Abuse:  No Significant Maternal Medications:  Meds include: Protonix Other: metformin, sertraline Significant Maternal Lab Results:  Lab values include: Group B Strep negative Other Comments:  Panorma - low risk  Review of Systems  Constitutional: Negative.   HENT: Negative.   Eyes: Negative.   Respiratory: Negative.   Cardiovascular: Negative.   Gastrointestinal: Positive for abdominal pain.  Genitourinary: Negative.   Musculoskeletal: Negative.   Skin: Negative.   Neurological: Positive for headaches.  Psychiatric/Behavioral: Negative.    Maternal Medical History:  Contractions: Frequency: irregular.    Fetal activity: Perceived fetal activity is  normal.    Prenatal complications: PIH and pre-eclampsia.   Prenatal Complications - Diabetes: gestational. Diabetes is managed by oral agent (monotherapy).        Last menstrual period 06/07/2017. Maternal Exam:  Uterine Assessment: Contraction strength is mild.  Contraction frequency is irregular.   Abdomen: Fundal height is difficult to appreciate given habitus.   Estimated fetal weight is 7.5#.   Fetal presentation: vertex  Introitus: Normal vulva. Normal vagina.  Cervix: Cervix evaluated by digital exam.   SVE 1-1.5cm  Physical Exam  Constitutional: She is oriented to person, place, and time.  Morbidly obese  HENT:  Head: Normocephalic and atraumatic.  Cardiovascular: Normal rate and regular rhythm.  Respiratory: Effort normal and breath sounds normal. No respiratory distress. She has no wheezes.  GI: Soft. Bowel sounds are normal. There is no tenderness.  Musculoskeletal: Normal range of motion.  Neurological: She is alert and oriented to person, place, and time.  Skin: Skin is warm and dry.  Psychiatric: She has a normal mood and affect. Her behavior is normal.   Abdominal wall edematous Prenatal labs: ABO, Rh: O/Positive/-- (01/30 0000) Antibody: Negative (01/30 0000) Rubella: Immune (01/30 0000) RPR: Nonreactive (01/30 0000)  HBsAg: Negative (01/30 0000)  HIV: Non-reactive (01/30 0000)  GBS:   negative  Panorama low risk - female; [place] nl anat,female, post plac BPP and growth followed Nl NT  vtx by bs [place] in office 7/30  Hgb 12.7/Plt 313/glucola 136 - 3hr GDM; Ur Cx neg/GC neg/Chl neg/Varicella immune/Hgb electro WNL  Assessment/Plan: 42yo [id number] at 87 w/ GDM,PIH, PreE, mental d/o, morbid obesity and AMA for IOL IOL with cytotec, AROM, pitocin Follow sugars - glucometer prn Epidural prn Expect SVD - d/w pt poss NICU admission    Admiral Marcucci Bovard-Stuckert 03/04/2018, 9:19 PM

## 2018-03-05 ENCOUNTER — Inpatient Hospital Stay (HOSPITAL_COMMUNITY): Payer: Medicaid Other | Admitting: Anesthesiology

## 2018-03-05 ENCOUNTER — Encounter (HOSPITAL_COMMUNITY)
Admission: RE | Disposition: A | Discharge status: Home / Self Care | Payer: Self-pay | Source: Home / Self Care | Attending: Obstetrics and Gynecology

## 2018-03-05 ENCOUNTER — Encounter (HOSPITAL_COMMUNITY): Payer: Self-pay

## 2018-03-05 ENCOUNTER — Inpatient Hospital Stay (HOSPITAL_COMMUNITY)
Admission: RE | Admit: 2018-03-05 | Discharge: 2018-03-09 | DRG: 788 | Disposition: A | Discharge status: Home / Self Care | Payer: Medicaid Other | Attending: Obstetrics and Gynecology | Admitting: Obstetrics and Gynecology

## 2018-03-05 DIAGNOSIS — O99343 Other mental disorders complicating pregnancy, third trimester: Secondary | ICD-10-CM

## 2018-03-05 DIAGNOSIS — O9962 Diseases of the digestive system complicating childbirth: Secondary | ICD-10-CM | POA: Diagnosis present

## 2018-03-05 DIAGNOSIS — Z87891 Personal history of nicotine dependence: Secondary | ICD-10-CM | POA: Diagnosis not present

## 2018-03-05 DIAGNOSIS — O99344 Other mental disorders complicating childbirth: Secondary | ICD-10-CM | POA: Diagnosis present

## 2018-03-05 DIAGNOSIS — J45909 Unspecified asthma, uncomplicated: Secondary | ICD-10-CM | POA: Diagnosis present

## 2018-03-05 DIAGNOSIS — O99213 Obesity complicating pregnancy, third trimester: Secondary | ICD-10-CM

## 2018-03-05 DIAGNOSIS — O134 Gestational [pregnancy-induced] hypertension without significant proteinuria, complicating childbirth: Secondary | ICD-10-CM | POA: Diagnosis present

## 2018-03-05 DIAGNOSIS — O24419 Gestational diabetes mellitus in pregnancy, unspecified control: Secondary | ICD-10-CM

## 2018-03-05 DIAGNOSIS — Z880 Allergy status to penicillin: Secondary | ICD-10-CM | POA: Diagnosis not present

## 2018-03-05 DIAGNOSIS — O139 Gestational [pregnancy-induced] hypertension without significant proteinuria, unspecified trimester: Secondary | ICD-10-CM | POA: Diagnosis present

## 2018-03-05 DIAGNOSIS — Z85820 Personal history of malignant melanoma of skin: Secondary | ICD-10-CM | POA: Diagnosis not present

## 2018-03-05 DIAGNOSIS — O24425 Gestational diabetes mellitus in childbirth, controlled by oral hypoglycemic drugs: Secondary | ICD-10-CM | POA: Diagnosis present

## 2018-03-05 DIAGNOSIS — K219 Gastro-esophageal reflux disease without esophagitis: Secondary | ICD-10-CM | POA: Diagnosis present

## 2018-03-05 DIAGNOSIS — F419 Anxiety disorder, unspecified: Secondary | ICD-10-CM | POA: Diagnosis present

## 2018-03-05 DIAGNOSIS — Z3A37 37 weeks gestation of pregnancy: Secondary | ICD-10-CM | POA: Diagnosis not present

## 2018-03-05 DIAGNOSIS — O99214 Obesity complicating childbirth: Secondary | ICD-10-CM | POA: Diagnosis present

## 2018-03-05 DIAGNOSIS — F329 Major depressive disorder, single episode, unspecified: Secondary | ICD-10-CM | POA: Diagnosis present

## 2018-03-05 DIAGNOSIS — O9952 Diseases of the respiratory system complicating childbirth: Secondary | ICD-10-CM | POA: Diagnosis present

## 2018-03-05 DIAGNOSIS — Z98891 History of uterine scar from previous surgery: Secondary | ICD-10-CM

## 2018-03-05 DIAGNOSIS — O133 Gestational [pregnancy-induced] hypertension without significant proteinuria, third trimester: Secondary | ICD-10-CM

## 2018-03-05 HISTORY — DX: History of uterine scar from previous surgery: Z98.891

## 2018-03-05 HISTORY — DX: Obesity complicating pregnancy, third trimester: O99.213

## 2018-03-05 HISTORY — DX: Other mental disorders complicating pregnancy, third trimester: O99.343

## 2018-03-05 HISTORY — DX: Gestational (pregnancy-induced) hypertension without significant proteinuria, third trimester: O13.3

## 2018-03-05 HISTORY — DX: Gestational diabetes mellitus in pregnancy, unspecified control: O24.419

## 2018-03-05 LAB — CBC
HEMATOCRIT: 38.2 % (ref 36.0–46.0)
HEMATOCRIT: 33 % — AB (ref 36.0–46.0)
HEMOGLOBIN: 12.6 g/dL (ref 12.0–15.0)
Hemoglobin: 11.1 g/dL — ABNORMAL LOW (ref 12.0–15.0)
MCH: 30.7 pg (ref 26.0–34.0)
MCH: 31.5 pg (ref 26.0–34.0)
MCHC: 33 g/dL (ref 30.0–36.0)
MCHC: 33.6 g/dL (ref 30.0–36.0)
MCV: 93.2 fL (ref 78.0–100.0)
MCV: 93.8 fL (ref 78.0–100.0)
PLATELETS: 277 10*3/uL (ref 150–400)
Platelets: 298 10*3/uL (ref 150–400)
RBC: 4.1 MIL/uL (ref 3.87–5.11)
RBC: 3.52 MIL/uL — AB (ref 3.87–5.11)
RDW: 14.4 % (ref 11.5–15.5)
RDW: 14.3 % (ref 11.5–15.5)
WBC: 8.8 10*3/uL (ref 4.0–10.5)
WBC: 17.7 10*3/uL — AB (ref 4.0–10.5)

## 2018-03-05 LAB — COMPREHENSIVE METABOLIC PANEL
ALT: 19 U/L (ref 0–44)
ANION GAP: 10 (ref 5–15)
AST: 21 U/L (ref 15–41)
Albumin: 3 g/dL — ABNORMAL LOW (ref 3.5–5.0)
Alkaline Phosphatase: 193 U/L — ABNORMAL HIGH (ref 38–126)
BUN: 12 mg/dL (ref 6–20)
CALCIUM: 10.4 mg/dL — AB (ref 8.9–10.3)
CO2: 21 mmol/L — AB (ref 22–32)
Chloride: 102 mmol/L (ref 98–111)
Creatinine, Ser: 0.77 mg/dL (ref 0.44–1.00)
GFR calc non Af Amer: >60 mL/min (ref >60)
Glucose, Bld: 117 mg/dL — ABNORMAL HIGH (ref 70–99)
Potassium: 4.2 mmol/L (ref 3.5–5.1)
SODIUM: 133 mmol/L — AB (ref 135–145)
Total Bilirubin: 0.3 mg/dL (ref 0.3–1.2)
Total Protein: 7 g/dL (ref 6.5–8.1)

## 2018-03-05 LAB — GLUCOSE, CAPILLARY
GLUCOSE-CAPILLARY: 86 mg/dL (ref 70–99)
Glucose-Capillary: 92 mg/dL (ref 70–99)
Glucose-Capillary: 85 mg/dL (ref 70–99)
Glucose-Capillary: 85 mg/dL (ref 70–99)
Glucose-Capillary: 76 mg/dL (ref 70–99)
Glucose-Capillary: 89 mg/dL (ref 70–99)

## 2018-03-05 LAB — TYPE AND SCREEN
ABO/RH(D): O POS
Antibody Screen: NEGATIVE

## 2018-03-05 LAB — ABO/RH: ABO/RH(D): O POS

## 2018-03-05 LAB — RPR: RPR: NONREACTIVE

## 2018-03-05 SURGERY — Surgical Case
Anesthesia: Epidural | Wound class: Clean Contaminated

## 2018-03-05 MED ORDER — METOCLOPRAMIDE HCL 5 MG/ML IJ SOLN
INTRAMUSCULAR | Status: DC | PRN
  Administered 2018-03-05: 10 mg via INTRAVENOUS

## 2018-03-05 MED ORDER — COCONUT OIL OIL: 1.0000 "application " | TOPICAL_OIL | Status: DC | PRN

## 2018-03-05 MED ORDER — FAMOTIDINE 20 MG PO TABS: 20.0000 mg | ORAL_TABLET | Freq: Every day | ORAL | Status: DC

## 2018-03-05 MED ORDER — ALBUTEROL SULFATE (2.5 MG/3ML) 0.083% IN NEBU: 3.0000 mL | INHALATION_SOLUTION | Freq: Four times a day (QID) | RESPIRATORY_TRACT | Status: DC | PRN

## 2018-03-05 MED ORDER — OXYTOCIN BOLUS FROM INFUSION: 500.0000 mL | Freq: Once | INTRAVENOUS | Status: DC

## 2018-03-05 MED ORDER — OXYCODONE-ACETAMINOPHEN 5-325 MG PO TABS: 1.0000 | ORAL_TABLET | ORAL | Status: DC | PRN

## 2018-03-05 MED ORDER — FAMOTIDINE 20 MG PO TABS
20.0000 mg | ORAL_TABLET | Freq: Every day | ORAL | Status: DC
  Administered 2018-03-06 – 2018-03-08 (×4): 20 mg via ORAL
  Filled 2018-03-05 (×4): qty 1

## 2018-03-05 MED ORDER — LIDOCAINE HCL (PF) 1 % IJ SOLN
INTRAMUSCULAR | Status: DC | PRN
  Administered 2018-03-05 (×2): 5 mL via EPIDURAL

## 2018-03-05 MED ORDER — ACETAMINOPHEN 325 MG PO TABS
650.0000 mg | ORAL_TABLET | ORAL | Status: DC | PRN
  Administered 2018-03-05: 650 mg via ORAL
  Filled 2018-03-05: qty 2

## 2018-03-05 MED ORDER — OXYTOCIN 10 UNIT/ML IJ SOLN
INTRAMUSCULAR | Status: AC
  Filled 2018-03-05: qty 4

## 2018-03-05 MED ORDER — MORPHINE SULFATE (PF) 0.5 MG/ML IJ SOLN
INTRAMUSCULAR | Status: DC | PRN
  Administered 2018-03-05: 4 mg via EPIDURAL

## 2018-03-05 MED ORDER — LACTATED RINGERS IV SOLN
INTRAVENOUS | Status: DC
  Administered 2018-03-05 – 2018-03-06 (×2): via INTRAVENOUS

## 2018-03-05 MED ORDER — LIDOCAINE HCL (PF) 1 % IJ SOLN
30.0000 mL | INTRAMUSCULAR | Status: DC | PRN
  Filled 2018-03-05: qty 30

## 2018-03-05 MED ORDER — DIPHENHYDRAMINE HCL 25 MG PO CAPS: 25.0000 mg | ORAL_CAPSULE | Freq: Four times a day (QID) | ORAL | Status: DC | PRN

## 2018-03-05 MED ORDER — WITCH HAZEL-GLYCERIN EX PADS: 1.0000 "application " | MEDICATED_PAD | CUTANEOUS | Status: DC | PRN

## 2018-03-05 MED ORDER — ZOLPIDEM TARTRATE 5 MG PO TABS
5.0000 mg | ORAL_TABLET | Freq: Every evening | ORAL | Status: DC | PRN
  Administered 2018-03-07: 5 mg via ORAL
  Filled 2018-03-05: qty 1

## 2018-03-05 MED ORDER — SIMETHICONE 80 MG PO CHEW
80.0000 mg | CHEWABLE_TABLET | ORAL | Status: DC | PRN
  Administered 2018-03-06: 80 mg via ORAL
  Filled 2018-03-05: qty 1

## 2018-03-05 MED ORDER — OXYCODONE HCL 5 MG PO TABS
5.0000 mg | ORAL_TABLET | ORAL | Status: DC | PRN
  Administered 2018-03-06 (×2): 5 mg via ORAL
  Filled 2018-03-05 (×2): qty 1

## 2018-03-05 MED ORDER — BUSPIRONE HCL 10 MG PO TABS
10.0000 mg | ORAL_TABLET | Freq: Three times a day (TID) | ORAL | Status: DC
  Administered 2018-03-05: 10 mg via ORAL
  Filled 2018-03-05 (×5): qty 1

## 2018-03-05 MED ORDER — SIMETHICONE 80 MG PO CHEW
80.0000 mg | CHEWABLE_TABLET | Freq: Three times a day (TID) | ORAL | Status: DC
  Administered 2018-03-06 – 2018-03-09 (×8): 80 mg via ORAL
  Filled 2018-03-05 (×10): qty 1

## 2018-03-05 MED ORDER — ONDANSETRON HCL 4 MG/2ML IJ SOLN
4.0000 mg | Freq: Three times a day (TID) | INTRAMUSCULAR | Status: DC | PRN
  Filled 2018-03-05: qty 2

## 2018-03-05 MED ORDER — FENTANYL 2.5 MCG/ML BUPIVACAINE 1/10 % EPIDURAL INFUSION (WH - ANES)
14.0000 mL/h | INTRAMUSCULAR | Status: DC | PRN
  Administered 2018-03-05: 14 mL/h via EPIDURAL
  Filled 2018-03-05: qty 100

## 2018-03-05 MED ORDER — ONDANSETRON HCL 4 MG/2ML IJ SOLN
INTRAMUSCULAR | Status: AC
  Filled 2018-03-05: qty 2

## 2018-03-05 MED ORDER — BUTORPHANOL TARTRATE 1 MG/ML IJ SOLN: 1.0000 mg | INTRAMUSCULAR | Status: DC | PRN

## 2018-03-05 MED ORDER — OXYCODONE HCL 5 MG PO TABS
10.0000 mg | ORAL_TABLET | ORAL | Status: DC | PRN
  Administered 2018-03-06 – 2018-03-09 (×8): 10 mg via ORAL
  Filled 2018-03-05 (×8): qty 2

## 2018-03-05 MED ORDER — PRENATAL MULTIVITAMIN CH
1.0000 | ORAL_TABLET | Freq: Every day | ORAL | Status: DC
  Filled 2018-03-05 (×2): qty 1

## 2018-03-05 MED ORDER — MEPERIDINE HCL 25 MG/ML IJ SOLN: 6.2500 mg | INTRAMUSCULAR | Status: DC | PRN

## 2018-03-05 MED ORDER — ONDANSETRON HCL 4 MG/2ML IJ SOLN
4.0000 mg | Freq: Once | INTRAMUSCULAR | Status: AC
  Administered 2018-03-05: 4 mg via INTRAVENOUS

## 2018-03-05 MED ORDER — SCOPOLAMINE 1 MG/3DAYS TD PT72: 1.0000 | MEDICATED_PATCH | Freq: Once | TRANSDERMAL | Status: DC

## 2018-03-05 MED ORDER — DEXAMETHASONE SODIUM PHOSPHATE 4 MG/ML IJ SOLN
INTRAMUSCULAR | Status: AC
  Filled 2018-03-05: qty 1

## 2018-03-05 MED ORDER — NALOXONE HCL 4 MG/10ML IJ SOLN: 1.0000 ug/kg/h | INTRAVENOUS | Status: DC | PRN

## 2018-03-05 MED ORDER — TERBUTALINE SULFATE 1 MG/ML IJ SOLN: 0.2500 mg | Freq: Once | INTRAMUSCULAR | Status: DC | PRN

## 2018-03-05 MED ORDER — LORATADINE 10 MG PO TABS
10.0000 mg | ORAL_TABLET | Freq: Every day | ORAL | Status: DC
  Administered 2018-03-06 – 2018-03-08 (×4): 10 mg via ORAL
  Filled 2018-03-05 (×4): qty 1

## 2018-03-05 MED ORDER — DIBUCAINE 1 % RE OINT: 1.0000 "application " | TOPICAL_OINTMENT | RECTAL | Status: DC | PRN

## 2018-03-05 MED ORDER — LACTATED RINGERS IV SOLN
500.0000 mL | Freq: Once | INTRAVENOUS | Status: AC
  Administered 2018-03-05: 500 mL via INTRAVENOUS

## 2018-03-05 MED ORDER — GENTAMICIN SULFATE 40 MG/ML IJ SOLN
Freq: Once | INTRAVENOUS | Status: AC
  Administered 2018-03-05: 110.5 mL via INTRAVENOUS
  Filled 2018-03-05: qty 10.5

## 2018-03-05 MED ORDER — LACTATED RINGERS IV SOLN
INTRAVENOUS | Status: DC
  Administered 2018-03-05 (×3): via INTRAVENOUS

## 2018-03-05 MED ORDER — ACETAMINOPHEN 10 MG/ML IV SOLN
INTRAVENOUS | Status: AC
  Administered 2018-03-05: 1000 mg via INTRAVENOUS
  Filled 2018-03-05: qty 100

## 2018-03-05 MED ORDER — BUSPIRONE HCL 10 MG PO TABS
10.0000 mg | ORAL_TABLET | Freq: Three times a day (TID) | ORAL | Status: DC | PRN
  Administered 2018-03-06 (×2): 10 mg via ORAL
  Filled 2018-03-05 (×3): qty 1

## 2018-03-05 MED ORDER — SOD CITRATE-CITRIC ACID 500-334 MG/5ML PO SOLN
30.0000 mL | ORAL | Status: DC | PRN
  Administered 2018-03-05: 30 mL via ORAL
  Filled 2018-03-05 (×2): qty 15

## 2018-03-05 MED ORDER — DIPHENHYDRAMINE HCL 50 MG/ML IJ SOLN: 12.5000 mg | INTRAMUSCULAR | Status: DC | PRN

## 2018-03-05 MED ORDER — OXYTOCIN 40 UNITS IN LACTATED RINGERS INFUSION - SIMPLE MED: 2.5000 [IU]/h | INTRAVENOUS | Status: DC

## 2018-03-05 MED ORDER — OXYTOCIN 10 UNIT/ML IJ SOLN
INTRAVENOUS | Status: DC | PRN
  Administered 2018-03-05: 40 [IU] via INTRAVENOUS

## 2018-03-05 MED ORDER — EPHEDRINE 5 MG/ML INJ: 10.0000 mg | INTRAVENOUS | Status: DC | PRN

## 2018-03-05 MED ORDER — SCOPOLAMINE 1 MG/3DAYS TD PT72
MEDICATED_PATCH | TRANSDERMAL | Status: AC
  Filled 2018-03-05: qty 1

## 2018-03-05 MED ORDER — PHENYLEPHRINE 40 MCG/ML (10ML) SYRINGE FOR IV PUSH (FOR BLOOD PRESSURE SUPPORT): 80.0000 ug | PREFILLED_SYRINGE | INTRAVENOUS | Status: DC | PRN

## 2018-03-05 MED ORDER — CYCLOBENZAPRINE HCL 10 MG PO TABS
10.0000 mg | ORAL_TABLET | Freq: Three times a day (TID) | ORAL | Status: DC | PRN
  Filled 2018-03-05: qty 1

## 2018-03-05 MED ORDER — MISOPROSTOL 50MCG HALF TABLET
50.0000 ug | ORAL_TABLET | Freq: Three times a day (TID) | ORAL | Status: DC | PRN
  Administered 2018-03-05: 50 ug via ORAL
  Filled 2018-03-05 (×2): qty 1

## 2018-03-05 MED ORDER — DEXAMETHASONE SODIUM PHOSPHATE 4 MG/ML IJ SOLN
INTRAMUSCULAR | Status: DC | PRN
  Administered 2018-03-05: 4 mg via INTRAVENOUS

## 2018-03-05 MED ORDER — SODIUM CHLORIDE 0.9% FLUSH: 3.0000 mL | INTRAVENOUS | Status: DC | PRN

## 2018-03-05 MED ORDER — OXYTOCIN 40 UNITS IN LACTATED RINGERS INFUSION - SIMPLE MED
1.0000 m[IU]/min | INTRAVENOUS | Status: DC
  Administered 2018-03-05: 2 m[IU]/min via INTRAVENOUS
  Filled 2018-03-05: qty 1000

## 2018-03-05 MED ORDER — SCOPOLAMINE 1 MG/3DAYS TD PT72
MEDICATED_PATCH | TRANSDERMAL | Status: DC | PRN
  Administered 2018-03-05: 1 via TRANSDERMAL

## 2018-03-05 MED ORDER — PNEUMOCOCCAL VAC POLYVALENT 25 MCG/0.5ML IJ INJ
0.5000 mL | INJECTION | INTRAMUSCULAR | Status: DC
  Filled 2018-03-05: qty 0.5

## 2018-03-05 MED ORDER — LACTATED RINGERS IV SOLN: 500.0000 mL | INTRAVENOUS | Status: DC | PRN

## 2018-03-05 MED ORDER — MENTHOL 3 MG MT LOZG: 1.0000 | LOZENGE | OROMUCOSAL | Status: DC | PRN

## 2018-03-05 MED ORDER — PHENYLEPHRINE 40 MCG/ML (10ML) SYRINGE FOR IV PUSH (FOR BLOOD PRESSURE SUPPORT)
PREFILLED_SYRINGE | INTRAVENOUS | Status: AC
  Filled 2018-03-05: qty 10

## 2018-03-05 MED ORDER — ACETAMINOPHEN 10 MG/ML IV SOLN
1000.0000 mg | Freq: Four times a day (QID) | INTRAVENOUS | Status: DC
  Administered 2018-03-05: 1000 mg via INTRAVENOUS
  Filled 2018-03-05 (×3): qty 100

## 2018-03-05 MED ORDER — OXYCODONE-ACETAMINOPHEN 5-325 MG PO TABS: 2.0000 | ORAL_TABLET | ORAL | Status: DC | PRN

## 2018-03-05 MED ORDER — SENNOSIDES-DOCUSATE SODIUM 8.6-50 MG PO TABS
2.0000 | ORAL_TABLET | ORAL | Status: DC
  Administered 2018-03-06 – 2018-03-09 (×4): 2 via ORAL
  Filled 2018-03-05 (×7): qty 2

## 2018-03-05 MED ORDER — SIMETHICONE 80 MG PO CHEW
80.0000 mg | CHEWABLE_TABLET | ORAL | Status: DC
  Administered 2018-03-06 – 2018-03-09 (×4): 80 mg via ORAL
  Filled 2018-03-05 (×6): qty 1

## 2018-03-05 MED ORDER — ACETAMINOPHEN 325 MG PO TABS
650.0000 mg | ORAL_TABLET | ORAL | Status: DC | PRN
  Administered 2018-03-06 – 2018-03-07 (×4): 650 mg via ORAL
  Filled 2018-03-05 (×4): qty 2

## 2018-03-05 MED ORDER — LABETALOL HCL 200 MG PO TABS
200.0000 mg | ORAL_TABLET | Freq: Two times a day (BID) | ORAL | Status: DC
  Administered 2018-03-05: 200 mg via ORAL
  Filled 2018-03-05: qty 1

## 2018-03-05 MED ORDER — METOCLOPRAMIDE HCL 5 MG/ML IJ SOLN
INTRAMUSCULAR | Status: AC
  Filled 2018-03-05: qty 2

## 2018-03-05 MED ORDER — LIDOCAINE-EPINEPHRINE (PF) 2 %-1:200000 IJ SOLN
INTRAMUSCULAR | Status: DC | PRN
  Administered 2018-03-05 (×3): 5 mL via EPIDURAL

## 2018-03-05 MED ORDER — SERTRALINE HCL 50 MG PO TABS
50.0000 mg | ORAL_TABLET | Freq: Every day | ORAL | Status: DC
  Administered 2018-03-06: 25 mg via ORAL
  Filled 2018-03-05 (×4): qty 1

## 2018-03-05 MED ORDER — OXYTOCIN 40 UNITS IN LACTATED RINGERS INFUSION - SIMPLE MED: 2.5000 [IU]/h | INTRAVENOUS | Status: AC

## 2018-03-05 MED ORDER — LACTATED RINGERS IV SOLN
INTRAVENOUS | Status: DC | PRN
  Administered 2018-03-05: 16:00:00 via INTRAVENOUS

## 2018-03-05 MED ORDER — MORPHINE SULFATE (PF) 0.5 MG/ML IJ SOLN
INTRAMUSCULAR | Status: AC
  Filled 2018-03-05: qty 10

## 2018-03-05 MED ORDER — ONDANSETRON HCL 4 MG/2ML IJ SOLN
4.0000 mg | Freq: Four times a day (QID) | INTRAMUSCULAR | Status: DC | PRN
  Administered 2018-03-05: 4 mg via INTRAVENOUS
  Filled 2018-03-05: qty 2

## 2018-03-05 MED ORDER — DIPHENHYDRAMINE HCL 25 MG PO CAPS
25.0000 mg | ORAL_CAPSULE | ORAL | Status: DC | PRN
  Filled 2018-03-05: qty 1

## 2018-03-05 MED ORDER — LABETALOL HCL 200 MG PO TABS
200.0000 mg | ORAL_TABLET | Freq: Two times a day (BID) | ORAL | Status: DC
  Administered 2018-03-06 – 2018-03-09 (×8): 200 mg via ORAL
  Filled 2018-03-05 (×8): qty 1

## 2018-03-05 MED ORDER — PHENYLEPHRINE 40 MCG/ML (10ML) SYRINGE FOR IV PUSH (FOR BLOOD PRESSURE SUPPORT)
80.0000 ug | PREFILLED_SYRINGE | INTRAVENOUS | Status: DC | PRN
  Filled 2018-03-05: qty 10

## 2018-03-05 MED ORDER — IBUPROFEN 800 MG PO TABS
800.0000 mg | ORAL_TABLET | Freq: Three times a day (TID) | ORAL | Status: DC
  Administered 2018-03-06 – 2018-03-09 (×11): 800 mg via ORAL
  Filled 2018-03-05 (×11): qty 1

## 2018-03-05 MED ORDER — ONDANSETRON HCL 4 MG/2ML IJ SOLN
INTRAMUSCULAR | Status: DC | PRN
  Administered 2018-03-05: 4 mg via INTRAVENOUS

## 2018-03-05 MED ORDER — LORATADINE 10 MG PO TABS
10.0000 mg | ORAL_TABLET | Freq: Every day | ORAL | Status: DC
  Administered 2018-03-05: 10 mg via ORAL
  Filled 2018-03-05 (×2): qty 1

## 2018-03-05 MED ORDER — LIDOCAINE-EPINEPHRINE (PF) 2 %-1:200000 IJ SOLN
INTRAMUSCULAR | Status: AC
  Filled 2018-03-05: qty 20

## 2018-03-05 MED ORDER — NALOXONE HCL 0.4 MG/ML IJ SOLN: 0.4000 mg | INTRAMUSCULAR | Status: DC | PRN

## 2018-03-05 MED ORDER — TETANUS-DIPHTH-ACELL PERTUSSIS 5-2.5-18.5 LF-MCG/0.5 IM SUSP: 0.5000 mL | Freq: Once | INTRAMUSCULAR | Status: DC

## 2018-03-05 SURGICAL SUPPLY — 46 items
BENZOIN TINCTURE PRP APPL 2/3 (GAUZE/BANDAGES/DRESSINGS) ×3 IMPLANT
CHLORAPREP W/TINT 26ML (MISCELLANEOUS) ×3 IMPLANT
CLAMP CORD UMBIL (MISCELLANEOUS) IMPLANT
CLOSURE WOUND 1/2 X4 (GAUZE/BANDAGES/DRESSINGS) ×1
CLOTH BEACON ORANGE TIMEOUT ST (SAFETY) ×3 IMPLANT
DRESSING PREVENA PLUS CUSTOM (GAUZE/BANDAGES/DRESSINGS) ×1 IMPLANT
DRSG OPSITE POSTOP 4X10 (GAUZE/BANDAGES/DRESSINGS) ×3 IMPLANT
DRSG PREVENA PLUS CUSTOM (GAUZE/BANDAGES/DRESSINGS) ×3
ELECT REM PT RETURN 9FT ADLT (ELECTROSURGICAL) ×3
ELECTRODE REM PT RTRN 9FT ADLT (ELECTROSURGICAL) ×1 IMPLANT
EXTRACTOR VACUUM M CUP 4 TUBE (SUCTIONS) ×2 IMPLANT
EXTRACTOR VACUUM M CUP 4' TUBE (SUCTIONS) ×1
GLOVE BIO SURGEON STRL SZ 6.5 (GLOVE) ×2 IMPLANT
GLOVE BIO SURGEONS STRL SZ 6.5 (GLOVE) ×1
GLOVE BIOGEL PI IND STRL 7.0 (GLOVE) ×1 IMPLANT
GLOVE BIOGEL PI INDICATOR 7.0 (GLOVE) ×2
GOWN STRL REUS W/TWL LRG LVL3 (GOWN DISPOSABLE) ×6 IMPLANT
HOVERMATT SINGLE USE (MISCELLANEOUS) ×3 IMPLANT
KIT ABG SYR 3ML LUER SLIP (SYRINGE) IMPLANT
KIT PREVENA INCISION MGT20CM45 (CANNISTER) ×3 IMPLANT
NEEDLE HYPO 25X5/8 SAFETYGLIDE (NEEDLE) IMPLANT
NS IRRIG 1000ML POUR BTL (IV SOLUTION) ×3 IMPLANT
PACK C SECTION WH (CUSTOM PROCEDURE TRAY) ×3 IMPLANT
PAD OB MATERNITY 4.3X12.25 (PERSONAL CARE ITEMS) ×3 IMPLANT
PENCIL SMOKE EVAC W/HOLSTER (ELECTROSURGICAL) ×3 IMPLANT
RETRACTOR TRAXI PANNICULUS (MISCELLANEOUS) ×1 IMPLANT
RTRCTR C-SECT PINK 25CM LRG (MISCELLANEOUS) ×3 IMPLANT
SPONGE LAP 18X18 RF (DISPOSABLE) ×9 IMPLANT
STRIP CLOSURE SKIN 1/2X4 (GAUZE/BANDAGES/DRESSINGS) ×2 IMPLANT
SUT MNCRL 0 VIOLET CTX 36 (SUTURE) ×3 IMPLANT
SUT MONOCRYL 0 CTX 36 (SUTURE) ×6
SUT PLAIN 1 NONE 54 (SUTURE) IMPLANT
SUT PLAIN 2 0 XLH (SUTURE) ×3 IMPLANT
SUT VIC AB 0 CT1 27 (SUTURE) ×4
SUT VIC AB 0 CT1 27XBRD ANBCTR (SUTURE) ×2 IMPLANT
SUT VIC AB 2-0 CT1 27 (SUTURE) ×2
SUT VIC AB 2-0 CT1 TAPERPNT 27 (SUTURE) ×1 IMPLANT
SUT VIC AB 3-0 CT1 27 (SUTURE) ×4
SUT VIC AB 3-0 CT1 TAPERPNT 27 (SUTURE) ×2 IMPLANT
SUT VIC AB 3-0 SH 27 (SUTURE) ×2
SUT VIC AB 3-0 SH 27X BRD (SUTURE) ×1 IMPLANT
SUT VIC AB 4-0 KS 27 (SUTURE) ×3 IMPLANT
SYR BULB IRRIGATION 50ML (SYRINGE) ×3 IMPLANT
TOWEL OR 17X24 6PK STRL BLUE (TOWEL DISPOSABLE) ×3 IMPLANT
TRAXI PANNICULUS RETRACTOR (MISCELLANEOUS) ×2
TRAY FOLEY W/BAG SLVR 14FR LF (SET/KITS/TRAYS/PACK) ×3 IMPLANT

## 2018-03-05 NOTE — Anesthesia Preprocedure Evaluation (Signed)
Anesthesia Evaluation  Patient identified by MRN, date of birth, ID band Patient awake    Reviewed: Allergy & Precautions, H&P , NPO status , Patient's Chart, lab work & pertinent test results  History of Anesthesia Complications (+) PONV  Airway Mallampati: II  TM Distance: >3 FB Neck ROM: full    Dental no notable dental hx. (+) Teeth Intact   Pulmonary former smoker,    Pulmonary exam normal breath sounds clear to auscultation       Cardiovascular hypertension, Normal cardiovascular exam Rhythm:regular Rate:Normal     Neuro/Psych  Headaches, PSYCHIATRIC DISORDERS Anxiety Depression Bipolar Disorder OBSESSIVE-COMPULSIVE DISORDERS   GI/Hepatic negative GI ROS, Neg liver ROS,   Endo/Other  diabetes, GestationalMorbid obesity  Renal/GU negative Renal ROS  negative genitourinary   Musculoskeletal   Abdominal (+) + obese,   Peds  Hematology negative hematology ROS (+)   Anesthesia Other Findings Super obese  Reproductive/Obstetrics (+) Pregnancy                             Anesthesia Physical Anesthesia Plan  ASA: IV  Anesthesia Plan: Epidural   Post-op Pain Management:    Induction:   PONV Risk Score and Plan:   Airway Management Planned:   Additional Equipment:   Intra-op Plan:   Post-operative Plan:   Informed Consent: I have reviewed the patients History and Physical, chart, labs and discussed the procedure including the risks, benefits and alternatives for the proposed anesthesia with the patient or authorized representative who has indicated his/her understanding and acceptance.     Plan Discussed with:   Anesthesia Plan Comments:         Anesthesia Quick Evaluation

## 2018-03-05 NOTE — Transfer of Care (Signed)
Immediate Anesthesia Transfer of Care Note  Patient: Grace Fisher  Procedure(s) Performed: CESAREAN SECTION (N/A )  Patient Location: PACU  Anesthesia Type:Epidural  Level of Consciousness: awake, alert  and oriented  Airway & Oxygen Therapy: Patient Spontanous Breathing  Post-op Assessment: Report given to RN and Post -op Vital signs reviewed and stable  Post vital signs: Reviewed and stable  Last Vitals:  Vitals Value Taken Time  BP 123/78 03/05/2018  5:04 PM  Temp    Pulse 74 03/05/2018  5:06 PM  Resp    SpO2 100 % 03/05/2018  5:06 PM  Vitals shown include unvalidated device data.  Last Pain:  Vitals:   03/05/18 1508  TempSrc:   PainSc: 0-No pain         Complications: No apparent anesthesia complications

## 2018-03-05 NOTE — Progress Notes (Signed)
Patient ID: Grace Fisher, female   DOB: Oct 18, 1975, 41 y.o.   MRN: [id number]   IOL 37+ GDM, PIH, CHTN, depression and anxiety, morbid obesity, poor social situation  Reviewed H&P, no changes.  D/W pt POC - labor and delivery plan.    AFVSS gen NAD FHTs 130's mod var, category 1 toco q 2-63min  SVE 2cm per RN  Recently got epidural Will ROM when comfortable, apply FSE Expect SVD  ROM for clear, copious fluid FSE applied, IUPC placed.    SVE 2.3/70/-2 Will start pitocin

## 2018-03-05 NOTE — Progress Notes (Signed)
Patient ID: Grace Fisher, female   DOB: 21-May-1976, 42 y.o.   MRN: [id number]   Late entry  Pt reached C/C/0-+1 - pushed for 10-48minutes  During pushing pt with recurrent severe variables.   Had difficulty tracing - scalp lead removed, replaced. Had to change cord.    D/W pt r/b/a of LTCS including but not limited to bleeding, infection, an damage to surrounding organs - also difficulty healing and injury to baby.    Severe repetitive lates, slow recovery to baseline  Given patient's morbid obesity and slow recovery of variables will proceed with cesarean section urgently.

## 2018-03-05 NOTE — Progress Notes (Signed)
Patient ID: Grace Fisher, female   DOB: Nov 30, 1975, 42 y.o.   MRN: [id number]   Comfortable w epidural  AFVSS (BP mildly elevated) GEN nad Abd soft, gravid  FHTs 120's, mod var, early decel, category 1-2 toco q 25min  SVE 5.6/70/-1  Doing well Continue current mgmt

## 2018-03-05 NOTE — Brief Op Note (Signed)
03/05/2018  5:28 PM  PATIENT:  Grace Fisher  42 y.o. female  PRE-OPERATIVE DIAGNOSIS:  repetative severe varibles  POST-OPERATIVE DIAGNOSIS:  repetative severe varibles  PROCEDURE:  Procedure(s): CESAREAN SECTION (N/A)  SURGEON:  Surgeon(s) and Role:    * Bovard-Stuckert, Arjen Deringer, MD - Primary  ASSISTANTS: Maida Sale RNFA   ANESTHESIA:   epidural  EBL:  1622 mL uop and IVF per anesthesia  BLOOD ADMINISTERED:none  DRAINS: Urinary Catheter (Foley)   LOCAL MEDICATIONS USED:  NONE  SPECIMEN:  Source of Specimen:  Placenta  DISPOSITION OF SPECIMEN:  PATHOLOGY  COUNTS:  YES  TOURNIQUET:  * No tourniquets in log *  DICTATION: .Other Dictation: Dictation Number [id number]  PLAN OF CARE: Admit to inpatient   PATIENT DISPOSITION:  PACU - hemodynamically stable.   Delay start of Pharmacological VTE agent (>24hrs) due to surgical blood loss or risk of bleeding: not applicable

## 2018-03-05 NOTE — Anesthesia Procedure Notes (Signed)
Epidural Patient location during procedure: OB Start time: 03/05/2018 7:57 AM End time: 03/05/2018 8:07 AM  Staffing Anesthesiologist: Murvin Natal, MD Performed: anesthesiologist   Preanesthetic Checklist Completed: patient identified, site marked, pre-op evaluation, timeout performed, IV checked, risks and benefits discussed and monitors and equipment checked  Epidural Patient position: sitting Prep: DuraPrep Patient monitoring: heart rate, cardiac monitor, continuous pulse ox and blood pressure Approach: midline Location: L4-L5 Injection technique: LOR air  Needle:  Needle type: Tuohy  Needle gauge: 17 G Needle length: 9 cm Needle insertion depth: 8 cm Catheter type: closed end flexible Catheter size: 19 Gauge Catheter at skin depth: 13 cm Test dose: negative and Other  Assessment Events: blood not aspirated, injection not painful, no injection resistance and negative IV test  Additional Notes Informed consent obtained prior to proceeding including risk of failure, 1% risk of PDPH, risk of minor discomfort and bruising. Discussed alternatives to epidural analgesia and patient desires to proceed.  Timeout performed pre-procedure verifying patient name, procedure, and platelet count.  Patient tolerated procedure well. Reason for block:procedure for pain

## 2018-03-05 NOTE — Op Note (Signed)
NAME: Grace Fisher, Grace Fisher MEDICAL RECORD [id number] ACCOUNT [id number] DATE OF BIRTH:[birth year] FACILITY: [facility] LOCATION: [facility] PHYSICIAN:Jamason Peckham BOVARD-STUCKERT, MD  OPERATIVE REPORT  DATE OF PROCEDURE:  03/05/2018  PREOPERATIVE DIAGNOSES:  Intrauterine pregnancy at 37 weeks, induced for PIH also with gestational diabetes and repetitive severe variables with slow recovery.  POSTOPERATIVE DIAGNOSES:  Intrauterine pregnancy at 37 weeks, induced for hypertension, also with gestational diabetes and repetitive severe variables with slow recovery,  delivered.  PROCEDURE:  Primary low transverse cesarean section.  SURGEON:  Thornell Sartorius, M.D.  ASSISTANT:  Lester [place], RNFA.  ANESTHESIA:  Epidural.  IV FLUIDS AND URINE OUTPUT:  Per Anesthesia.  However, the urine output was noted to be clear throughout the case when Anesthesia was repetitively asked.  ESTIMATED BLOOD LOSS:  Approximately 1622 mL.  COMPLICATIONS:  Cervical extension.  PATHOLOGY:  Placenta to Pathology.  DESCRIPTION OF PROCEDURE:  After informed consent was reviewed with the patient and her partner, she was transferred to the OR expeditiously as her epidural was redosed.  She was then prepped and draped in the normal sterile fashion.  A Foley catheter  had previously been placed.  Betadine was used for prep.  Pfannenstiel skin incision was made at the level of approximately 2 fingerbreadths above the pubic symphysis, carried through to the underlying layer of fascia sharply.  The fascia was incised and  the incision was extended laterally with Mayo scissors.  The superior aspect of the fascial incision was grasped with Kocher clamps, elevated and the rectus muscles were dissected off both bluntly and sharply.  Midline was easily identified and the  peritoneum was entered bluntly.  An Alexis skin retractor was placed carefully making sure that no bowel was entrapped.  Bowel was easily visualized and was packed away with a  moist laparotomy sponge that was tagged.  The uterus was incised in a  transverse fashion.  The infant was deep in the pelvis and was raised to the level of the incision and a vacuum was used to aid delivery.  The nose and mouth were suctioned on the field.  The cord was clamped and cut after a minute.  The infant was  handed off to the waiting pediatric staff.  The uterus was cleared of all clot and debris.  The placenta was expressed.  The uterine incision was closed with 0 Monocryl to the level of the extension and the extension was reapproximated with another  suture of 0 Monocryl to the apex. This appeared to be hemostatic until further exploration illustrated that we had not reached the apex of the extension.  Therefore, the apex was identified with an Allis clamp and was closed with  0 Monocryl.  The second layer was used for an imbrication.  This was noted to be hemostatic.  The gutters were cleared of all clot and debris.  The Alexis retractor was removed as was the sponge.   The peritoneum was reapproximated using 2-0 Vicryl.  The  subfascial planes were inspected and found to be hemostatic.  The fascia was reapproximated using 0 Vicryl in a running fashion.  The subcuticular adipose layer was made hemostatic and the dead space was closed with plain gut.  The skin was  reapproximated in a subcuticular fashion with 4-0 Vicryl on a Keith needle.  A prevena bandage was used to close the incision.  Prevena bandage was used to make sure that this incision site dry.  It was applied and a good seal was made.    The  patient was then transported to the PACU in stable condition.  AN/NUANCE  D:03/05/2018 T:03/05/2018 JOB:[id number]

## 2018-03-06 ENCOUNTER — Encounter (HOSPITAL_COMMUNITY): Payer: Self-pay | Admitting: Obstetrics and Gynecology

## 2018-03-06 ENCOUNTER — Inpatient Hospital Stay (HOSPITAL_COMMUNITY): Payer: Medicaid Other

## 2018-03-06 LAB — CBC
HCT: 27.9 % — ABNORMAL LOW (ref 36.0–46.0)
Hemoglobin: 9.8 g/dL — ABNORMAL LOW (ref 12.0–15.0)
MCH: 32.7 pg (ref 26.0–34.0)
MCHC: 35.1 g/dL (ref 30.0–36.0)
MCV: 93 fL (ref 78.0–100.0)
PLATELETS: 232 10*3/uL (ref 150–400)
RBC: 3 MIL/uL — AB (ref 3.87–5.11)
RDW: 14.1 % (ref 11.5–15.5)
WBC: 13.8 10*3/uL — AB (ref 4.0–10.5)

## 2018-03-06 LAB — GLUCOSE, CAPILLARY
GLUCOSE-CAPILLARY: 92 mg/dL (ref 70–99)
Glucose-Capillary: 106 mg/dL — ABNORMAL HIGH (ref 70–99)
Glucose-Capillary: 99 mg/dL (ref 70–99)
Glucose-Capillary: 81 mg/dL (ref 70–99)

## 2018-03-06 MED ORDER — BUSPIRONE HCL 10 MG PO TABS
10.0000 mg | ORAL_TABLET | Freq: Three times a day (TID) | ORAL | Status: DC
  Administered 2018-03-06 – 2018-03-09 (×9): 10 mg via ORAL
  Filled 2018-03-06 (×9): qty 1

## 2018-03-06 MED ORDER — ALPRAZOLAM 0.5 MG PO TABS
0.5000 mg | ORAL_TABLET | Freq: Once | ORAL | Status: AC
  Administered 2018-03-06: 0.5 mg via ORAL
  Filled 2018-03-06: qty 1

## 2018-03-06 MED ORDER — BISACODYL 10 MG RE SUPP
10.0000 mg | Freq: Once | RECTAL | Status: AC
  Administered 2018-03-06: 10 mg via RECTAL
  Filled 2018-03-06: qty 1

## 2018-03-06 MED ORDER — PROMETHAZINE HCL 25 MG PO TABS: 25.0000 mg | ORAL_TABLET | Freq: Four times a day (QID) | ORAL | Status: DC | PRN

## 2018-03-06 MED ORDER — SERTRALINE HCL 25 MG PO TABS
25.0000 mg | ORAL_TABLET | Freq: Every day | ORAL | Status: DC
  Administered 2018-03-07 – 2018-03-09 (×3): 25 mg via ORAL
  Filled 2018-03-06 (×3): qty 1

## 2018-03-06 NOTE — Anesthesia Postprocedure Evaluation (Signed)
Anesthesia Post Note  Patient: Grace Fisher  Procedure(s) Performed: CESAREAN SECTION (N/A )     Patient location during evaluation: PACU Anesthesia Type: Epidural Level of consciousness: awake and alert Pain management: pain level controlled Vital Signs Assessment: post-procedure vital signs reviewed and stable Respiratory status: spontaneous breathing, nonlabored ventilation and respiratory function stable Cardiovascular status: stable Postop Assessment: no headache, no backache and epidural receding Anesthetic complications: no    Last Vitals:  Vitals:   03/06/18 0055 03/06/18 0419  BP: (!) 154/73 137/77  Pulse: 72 89  Resp:  18  Temp:  36.8 C  SpO2:  94%    Last Pain:  Vitals:   03/06/18 0419  TempSrc: Oral  PainSc: 0-No pain                 Grace Fisher

## 2018-03-06 NOTE — Progress Notes (Signed)
Patient ID: Grace Fisher, female   DOB: November 22, 1975, 42 y.o.   MRN: [id number] Pt doing well. Pain well controlled. Lochia mild. Has some diffuse itching - mild. Baby in NICU for glucose monitoring. Pt denies any HA or blurry vision or lightheadedness. Vomited last night ; no nausea now VSS ABD - dressing c/d/i EXT - scds in place  13.8>9.8<232  A/P: POD#1 s/p pltc/s for NRFHT          PIH - BP stable now on labetalol BID        GDM - FSBS wnl        Routine pp/post op care

## 2018-03-06 NOTE — Lactation Note (Signed)
This note was copied from a baby's chart. Lactation Consultation Note  Patient Name: Grace Fisher NZUDO'D Date: 03/06/2018  Mom states she is not interested in breastfeeding or pumping.  She states she may pump if milk comes in.  Discussed importance of pumping to induce lactation.  She will let us know if she desires to pump.   Maternal Data    Feeding    LATCH Score                   Interventions    Lactation Tools Discussed/Used     Consult Status      Ave Filter 03/06/2018, 9:53 AM

## 2018-03-06 NOTE — Progress Notes (Signed)
Pt with uncontrolled pain since late afternoon. Pt taking 10mg  oxy IR every 4 hrs with 650 tylenol every 4hrs with 800mg  Ibuprofen q 8. No flatus today with hypo BS. Dulcolax suppository just administered. MD notified/here in Delivery. Will see pt when delivery is complete.

## 2018-03-06 NOTE — Progress Notes (Signed)
CSW attempted to meet with MOB to offer support and complete assessment due to issues with homelessness during pregnancy, hx of Bipolar and Anxiety, and baby's admission to NICU.  MOB was not in her room at this time, so CSW looked for her at baby's bedside.  MOB was visiting with baby and had her best friend and best friend's husband with her.  Since there was no opportunity to speak with MOB privately at this time, CSW will request that weekend CSW follow up with MOB prior to her discharge.  MOB was very pleasant and stated although she had to have an emergency c-section, she feels she is coping well overall, and appears to be in good spirits.  She is open to having a CSW return at a later time and states no questions, concerns or needs at this time.

## 2018-03-06 NOTE — Progress Notes (Deleted)
Dr. Melba Coon notified of elevated blood pressure 144/77. No headache, vision disturbance, or epigastric pain. Dr. Melba Coon stated to only call for blood pressures exceeding 160/95.

## 2018-03-06 NOTE — Anesthesia Postprocedure Evaluation (Signed)
Anesthesia Post Note  Patient: Grace Fisher  Procedure(s) Performed: CESAREAN SECTION (N/A )     Patient location during evaluation: Mother Baby Anesthesia Type: Epidural Level of consciousness: awake and alert and oriented Pain management: satisfactory to patient Vital Signs Assessment: post-procedure vital signs reviewed and stable Respiratory status: respiratory function stable and spontaneous breathing Cardiovascular status: blood pressure returned to baseline Postop Assessment: no headache, no backache, spinal receding, patient able to bend at knees and adequate PO intake Anesthetic complications: no    Last Vitals:  Vitals:   03/06/18 0419 03/06/18 0949  BP: 137/77 128/67  Pulse: 89 68  Resp: 18   Temp: 36.8 C 37.1 C  SpO2: 94% 96%    Last Pain:  Vitals:   03/06/18 0949  TempSrc: Oral  PainSc: 5    Pain Goal:                 Kendrah Lovern

## 2018-03-06 NOTE — Progress Notes (Addendum)
Patient ID: Grace Fisher, female   DOB: 11-12-1975, 42 y.o.   MRN: [id number] Called and given the following info:  Pt came down from NICU between 6-7 pm via wheelchair. Shortly after she reported acute lower abdominal pain. She was given 2 5mg  oxycodone and ibuprofen but reported pain persisted.  She has since received 2 more 5mg  oxycodone with tylenol but continues to writhe in bed in pain Attempted I/O catheter unsuccessful - meeting resistance.  No vaginal bleeding or vaginal hematoma noted Abdomen soft, no rebound tenderness or guarding Has not had a BM yet despite suppository and stool softener today She denies fever or chill Reports severe cramping in pelvis  CT abdomen pelvis w/wout contrast ( per radiology) ordered

## 2018-03-06 NOTE — Addendum Note (Signed)
Addendum  created 03/06/18 1001 by Flossie Dibble, CRNA   Sign clinical note

## 2018-03-07 LAB — CBC
HCT: 27.3 % — ABNORMAL LOW (ref 36.0–46.0)
Hemoglobin: 9.6 g/dL — ABNORMAL LOW (ref 12.0–15.0)
MCH: 32.4 pg (ref 26.0–34.0)
MCHC: 35.2 g/dL (ref 30.0–36.0)
MCV: 92.2 fL (ref 78.0–100.0)
PLATELETS: 290 10*3/uL (ref 150–400)
RBC: 2.96 MIL/uL — ABNORMAL LOW (ref 3.87–5.11)
RDW: 14.1 % (ref 11.5–15.5)
WBC: 17.1 10*3/uL — AB (ref 4.0–10.5)

## 2018-03-07 LAB — GLUCOSE, CAPILLARY
Glucose-Capillary: 108 mg/dL — ABNORMAL HIGH (ref 70–99)
Glucose-Capillary: 93 mg/dL (ref 70–99)
Glucose-Capillary: 103 mg/dL — ABNORMAL HIGH (ref 70–99)

## 2018-03-07 MED ORDER — HYDROMORPHONE HCL 2 MG PO TABS
2.0000 mg | ORAL_TABLET | Freq: Once | ORAL | Status: AC
  Administered 2018-03-07: 2 mg via ORAL
  Filled 2018-03-07: qty 1

## 2018-03-07 MED ORDER — BUTORPHANOL TARTRATE 1 MG/ML IJ SOLN
1.0000 mg | Freq: Once | INTRAMUSCULAR | Status: AC
  Administered 2018-03-07: 1 mg via INTRAVENOUS
  Filled 2018-03-07: qty 1

## 2018-03-07 MED ORDER — ZOLPIDEM TARTRATE 5 MG PO TABS
5.0000 mg | ORAL_TABLET | Freq: Once | ORAL | Status: DC
  Filled 2018-03-07: qty 1

## 2018-03-07 MED ORDER — IOPAMIDOL (ISOVUE-300) INJECTION 61%
100.0000 mL | Freq: Once | INTRAVENOUS | Status: AC | PRN
  Administered 2018-03-07: 100 mL via INTRAVENOUS

## 2018-03-07 NOTE — Progress Notes (Signed)
This RN called to RM 131.,Pt sitting on edge of bed crying insisting on IV pain medicine. Dr. Terri Piedra called for order for pain medicine. MD ordered 0.5mg  xanax PO times 1.

## 2018-03-07 NOTE — Progress Notes (Signed)
Patient ID: Grace Fisher, female   DOB: 1975-08-28, 42 y.o.   MRN: [id number] Pt asleep  No further complaints after received stadol and Lorrin Mais Will encourage ambulation today Routine pp/post op care

## 2018-03-07 NOTE — Progress Notes (Signed)
Patient ID: [name], female   DOB: 03/12/1976, 42 y.o.   MRN: [id number] Pt still sleeping. Per FOB no complaints - has found comfortable position and pain controlled at this time Will continue with routine post op/pp care

## 2018-03-07 NOTE — Progress Notes (Signed)
CT results called and read to Dr. Terri Piedra. Informed her of pt's request for IV pain med. Dr. Terri Piedra spoke with pt on phone. Orders received to give pt one time dose of stadol IV and 5mg  ambien. Pt agreeable to plan.

## 2018-03-07 NOTE — Progress Notes (Signed)
Notified Dr. Terri Piedra on unit that pt is currently sleeping. AM VS not yet done to encourage pt's rest.

## 2018-03-07 NOTE — Progress Notes (Signed)
MD ordered CT of abdomen without contrast stat for severe uncontrolled abdominal pain. Pt was catheterized to determine if pain was a result of retained urine output as earlier voids were 100-200cc per void. Cath was negative for uriinary retention.

## 2018-03-07 NOTE — Progress Notes (Signed)
Dr Terri Piedra notified about negative dressing coming away from skin.  Orders given

## 2018-03-07 NOTE — Clinical Social Work Maternal (Signed)
CLINICAL SOCIAL WORK MATERNAL/CHILD NOTE  Patient Details  Name: Grace Fisher MRN: [id number] Date of Birth: 29-Aug-1975  Date:  03/07/2018  Clinical Social Worker Initiating Note:  Madilyn Fireman, MSW, LCSW-A Date/Time: Initiated:  03/07/18/1011     Child's Name:  Grace Fisher   Biological Parents:  Mother, Father   Need for Interpreter:  None   Reason for Referral:  Homelessness, Behavioral Health Concerns   Address:  [address] [place] [address]    Phone number:  [phone] (home)     Additional phone number: [phone]  Household Members/Support Persons (HM/SP):   Household Member/Support Person 1   HM/SP Name Relationship DOB or Age  HM/SP -1 Grace Fisher FOB    HM/SP -2        HM/SP -3        HM/SP -4        HM/SP -5        HM/SP -6        HM/SP -7        HM/SP -8          Natural Supports (not living in the home):  Extended Family   Professional Supports: Case Metallurgist   Employment: Unemployed   Type of Work:     Education:  Programmer, systems   Homebound arranged:    Museum/gallery curator Resources:  [employer]   Other Resources:  [employer], [employer] , Physicist, medical    Cultural/Religious Considerations Which May Impact Care:  Christian  Strengths:  Ability to meet basic needs , Psychotropic Medications, Home prepared for child    Psychotropic Medications:  Zoloft, Buspar      Pediatrician:       Pediatrician List:   [place]      Pediatrician Fax Number:    Risk Factors/Current Problems:      Cognitive State:  Other (Comment)(MOB in and out of sleep during assessment)   Mood/Affect:  Calm    CSW Assessment: CSW received consult for MOB due to complex social history including homelessness and multiple mental health diagnoses. MOB has been diagnosed with Bipolar, OCD, and Anxiety. MOB was asleep upon CSW  arrival, CSW was able to obtain permission from MOB to complete assessment with FOB. FOB is [employer]. FOB stated he just returned from the NICU where he was visiting baby Jaxon. Per FOB, Jaxon was admitted to the NICU for low iron levels and blood pressure concerns, but is now doing well. FOB reports that MOB has been in significant pain since her c-section delivery. FOB reports the couple receives Medicaid, WIC, and [employer]. FOB reports having all baby items at home for baby, including a bassinet for safe sleeping. CSW educated FOB on SIDS reduction techniques. CSW inquired about recent homelessness, FOB reports that the couple were living in a tent in [place] until they secured an apartment in [place] recently. FOB confirmed that MOB is actively taking her Buspar and Zoloft. CSW asked FOB to continue encouraging MOB to remain on her medication so that she stays stable. FOB reports he works full time at [employer] but [employer] is unemployed. FOB reports that a pediatrician has not yet been chosen. FOB reports having a car seat for infant. CSW informed FOB on how to add Jaxon to [facility] file, as well as [facility]  and Food Stamps first thing on Monday morning. FOB did not have further questions for CSW, CSW encouraged FOB to reach out if any need or concern were to arise.  CSW spoke with Carlynn Purl, DO regarding family. CSW did not identify any barriers to discharge.  CSW Plan/Description:  No Further Intervention Required/No Barriers to Discharge    Archie Endo, [place]  03/07/2018, 10:14 AM

## 2018-03-07 NOTE — Progress Notes (Signed)
In patient's room intermittently since 0220 to reassess response to IV stadol and ambien. Pt sitting on side of bed at 0220, moaning. Encouraged pt to get up into bed and try to rest as RN fears she may fall asleep sitting on side of bed and fall. Pt assisted up into bed onto right side with pillow supports around her. Seemed to be close to falling asleep though still intermittently moaning and wincing, then awoke and insisted upon sitting back up, moaning again. Pt then asked why she was up and why pain medicine wasn't working. Reminded pt that she wanted to sit up and that stadol was probably wearing off. Inquired whether she is passing gas, she states she is not. Informed pt that gas pains post-op can be quite painful and that ambulation and warm fluids are helpful. Pt initially did not want to try warm prune and apple juice but then agreed. Went back to room with warm juice and pt was ambulating to bathroom on her own, without moaning. (Pt previously ambulating moaning and holding onto FOB's hand.)

## 2018-03-08 ENCOUNTER — Other Ambulatory Visit: Payer: Self-pay

## 2018-03-08 ENCOUNTER — Encounter (HOSPITAL_COMMUNITY): Payer: Self-pay

## 2018-03-08 LAB — GLUCOSE, CAPILLARY: GLUCOSE-CAPILLARY: 129 mg/dL — AB (ref 70–99)

## 2018-03-08 NOTE — Progress Notes (Signed)
Pt ambulated in hall with nurse and was able to shower for the first time since delivery.  Visited with baby in NICU and returned to room.  Ordered food for dinner but pt not having an appetite and wanted to wait to eat.  Encouraged pt to eat.

## 2018-03-08 NOTE — Progress Notes (Signed)
Patient ID: Grace Fisher, female   DOB: 1975-12-08, 42 y.o.   MRN: [id number] POD#4 Pt slept well overnight - no pain complaints. States starting to appreciate discomfort - due meds. She was able to ambulate, void and pass gas yesterday. Lochia mild. Dressing over incision was changed yesterday. Baby doing better in NICU Pt and FOB express discomfort with recommendation for discharge to home today. Although did not express to Education officer, museum, they are worried about stairs at home and feel another day in hospital to help pt feel better/be better prepared for discharge  BP - 129/69, 108 ABD - bruising over incision noted but     dressing c/d/i with no drainage (honeycomb) EXT - no homans  8/3 Hg 9.6 stable from 9.8  A/P: POD#4 s/p stat pltc/s with cervical extension - stable         BP controlled on labetalol 200mg  po BID         Pain controlled with oxycodone so not allergic to it         Buspar TID/zoloft qd;ambien 5mg  po qhs Spoke to case worker, Education officer, museum and UR - per UR pt ok to stay another night        Strongly encourage ambulation today          Discharge to home tomorrow

## 2018-03-08 NOTE — Progress Notes (Signed)
CSW went to revisit with MOB and FOB just to check in. CSW inquired about MOB's other children, she informed CSW that her oldest is 44 years old and lives in an Alternative Family Living residence due to his cognitive impairment and unpredictable behaviors. MOB reports that her 44 year old [place] lives with her and FOB in their [place]. Camillia Herter is currently with his grandfather while MOB is at [employer]. MOB reports Camillia Herter will attend school at [employer]. MOB reports she was homeless during pregnancy due to being kicked out of her residence. MOB is receiving Section 8 Housing voucher to live in the [facility] apartment. CSW encouraged MOB to reach out to her Pregnancy Care Manager for assistance with resources that she may need, MOB agreeable to reach out to [facility]. CSW encouraged MOB to visit with infant in NICU as soon as she got her physical pain under control. MOB reports having reliable transportation to get home. Parents did not have further questions for CSW.  Madilyn Fireman, MSW, [place] Social Worker [facility] [phone]

## 2018-03-09 LAB — GLUCOSE, CAPILLARY: GLUCOSE-CAPILLARY: 99 mg/dL (ref 70–99)

## 2018-03-09 MED ORDER — ZOLPIDEM TARTRATE 5 MG PO TABS: 5.0000 mg | ORAL_TABLET | Freq: Every evening | ORAL | 0 refills | Status: AC | PRN

## 2018-03-09 MED ORDER — ALPRAZOLAM 0.5 MG PO TABS: 0.5000 mg | ORAL_TABLET | Freq: Two times a day (BID) | ORAL | 0 refills | Status: AC | PRN

## 2018-03-09 MED ORDER — IBUPROFEN 800 MG PO TABS: 800.0000 mg | ORAL_TABLET | Freq: Three times a day (TID) | ORAL | 0 refills | Status: AC

## 2018-03-09 MED ORDER — LABETALOL HCL 200 MG PO TABS: 200.0000 mg | ORAL_TABLET | Freq: Two times a day (BID) | ORAL | 1 refills | Status: AC

## 2018-03-09 MED ORDER — OXYCODONE HCL 5 MG PO TABS: 5.0000 mg | ORAL_TABLET | ORAL | 0 refills | Status: DC | PRN

## 2018-03-09 NOTE — Progress Notes (Signed)
POD #4 Feeling better after passing gas, pain ok, no problems, baby stable in NICU Afeb, VSS CBG all normal Abd- soft, fundus firm, new dressing intact D/c home

## 2018-03-09 NOTE — Discharge Instructions (Signed)
As per discharge pamphlet °

## 2018-03-09 NOTE — Discharge Summary (Signed)
OB Discharge Summary     Patient Name: Grace Fisher DOB: Dec 12, 1975 MRN: [id number]  Date of admission: 03/05/2018 Delivering MD: Janyth Contes   Date of discharge: 03/09/2018  Admitting diagnosis: INDUCTION Intrauterine pregnancy: [redacted]w[redacted]d     Secondary diagnosis:  Principal Problem:   S/P cesarean section Active Problems:   PIH (pregnancy induced hypertension), third trimester   Maternal obesity syndrome, antepartum, third trimester   Gestational diabetes mellitus (GDM) affecting fourth pregnancy   Mental disorder affecting pregnancy in third trimester   PIH (pregnancy induced hypertension)      Discharge diagnosis: Term Pregnancy Delivered, Gestational Hypertension, GDM A2 and [place] Hospital course:  Induction of Labor With Cesarean Section  42 y.o. yo [id number] at [redacted]w[redacted]d was admitted to the hospital 03/05/2018 for induction of labor. The patient went for cesarean section due to repetitive severe variable decels, and delivered a Viable infant,03/05/2018  Membrane Rupture Time/Date: 9:00 AM ,03/05/2018   Details of operation can be found in separate operative Note.  BP controlled with Labetalol, all CBG normal postpartum.  She had severe abdominal pain on POD #1, abdominal CT scan normal, pain eventually resolved. She is ambulating, tolerating a regular diet, passing flatus, and urinating well.  Patient is discharged home in stable condition on 03/09/18.                                    Physical exam  Vitals:   03/08/18 1420 03/08/18 1900 03/08/18 2100 03/09/18 0545  BP: 135/81 117/74  136/87  Pulse: (!) 105 (!) 110  97  Resp: 18 20  20   Temp: 98.4 F (36.9 C) 99.9 F (37.7 C) 99.2 F (37.3 C) 97.7 F (36.5 C)  TempSrc: Oral Oral Oral Oral  SpO2:  97%  100%  Weight:      Height:       General: alert Lochia: appropriate Uterine Fundus: firm Incision: Healing well with no significant drainage  Labs: Lab Results  Component  Value Date   WBC 17.1 (H) 03/07/2018   HGB 9.6 (L) 03/07/2018   HCT 27.3 (L) 03/07/2018   MCV 92.2 03/07/2018   PLT 290 03/07/2018   CMP Latest Ref Rng & Units 03/05/2018  Glucose 70 - 99 mg/dL 117(H)  BUN 6 - 20 mg/dL 12  Creatinine 0.44 - 1.00 mg/dL 0.77  Sodium 135 - 145 mmol/L 133(L)  Potassium 3.5 - 5.1 mmol/L 4.2  Chloride 98 - 111 mmol/L 102  CO2 22 - 32 mmol/L 21(L)  Calcium 8.9 - 10.3 mg/dL 10.4(H)  Total Protein 6.5 - 8.1 g/dL 7.0  Total Bilirubin 0.3 - 1.2 mg/dL 0.3  Alkaline Phos 38 - 126 U/L 193(H)  AST 15 - 41 U/L 21  ALT 0 - 44 U/L 19    Discharge instruction: per After Visit Summary and "Baby and Me Booklet".  After visit meds:  Allergies as of 03/09/2018      Reactions   Prednisone Rash, Swelling   Acetaminophen-codeine Other (See Comments)   severe migraine headache   Morphine Hives, Rash   Penicillins Rash   Has patient had a PCN reaction causing immediate rash, facial/tongue/throat swelling, SOB or lightheadedness with hypotension: No Has patient had a PCN  reaction causing severe rash involving mucus membranes or skin necrosis: No Has patient had a PCN reaction that required hospitalization No Has patient had a PCN reaction occurring within the last 10 years: No If all of the above answers are "NO", then may proceed with Cephalosporin use.   Sulfa Antibiotics Palpitations, Other (See Comments)   REACTION:  fever      Medication List    STOP taking these medications   DICLEGIS 10-10 MG Tbec Generic drug:  Doxylamine-Pyridoxine     TAKE these medications   acetaminophen 500 MG tablet Commonly known as:  TYLENOL Take 1,000 mg by mouth every 6 (six) hours as needed for headache.   albuterol 108 (90 Base) MCG/ACT inhaler Commonly known as:  PROVENTIL HFA;VENTOLIN HFA Inhale 2 puffs into the lungs every 6 (six) hours as needed for shortness of breath.   ALPRAZolam 0.5 MG tablet Commonly known as:  XANAX Take 1 tablet (0.5 mg total) by mouth 2  (two) times daily as needed for anxiety or sleep.   busPIRone 10 MG tablet Commonly known as:  BUSPAR Take 10 mg by mouth 3 (three) times daily.   cyclobenzaprine 10 MG tablet Commonly known as:  FLEXERIL take 1 TABLET BY MOUTH EVERY 8 HOURS AS NEEDED for headache pain   ibuprofen 800 MG tablet Commonly known as:  ADVIL,MOTRIN Take 1 tablet (800 mg total) by mouth every 8 (eight) hours.   labetalol 200 MG tablet Commonly known as:  NORMODYNE Take 1 tablet (200 mg total) by mouth 2 (two) times daily. What changed:    medication strength  how much to take   loratadine 10 MG tablet Commonly known as:  CLARITIN Take 10 mg by mouth daily.   oxyCODONE 5 MG immediate release tablet Commonly known as:  Oxy IR/ROXICODONE Take 1 tablet (5 mg total) by mouth every 4 (four) hours as needed for severe pain.   prenatal multivitamin Tabs tablet Take 2 tablets by mouth daily at 12 noon.   ranitidine 150 MG tablet Commonly known as:  ZANTAC Take 150 mg by mouth at bedtime.   sertraline 50 MG tablet Commonly known as:  ZOLOFT Take 1 tablet by mouth daily.   zolpidem 5 MG tablet Commonly known as:  AMBIEN Take 1 tablet (5 mg total) by mouth at bedtime as needed for sleep.       Diet: routine diet  Activity: Advance as tolerated. Pelvic rest for 6 weeks.   Outpatient follow up:3 days  Newborn Data: Live born female  Birth Weight: 7 lb 4.9 oz (3315 g) APGAR: 7, 8  Newborn Delivery   Birth date/time:  03/05/2018 15:37:00 Delivery type:  C-Section, Low Vertical Trial of labor:  Yes C-section categorization:  Primary     Baby Feeding: Breast Disposition:NICU   03/09/2018 Clarene Duke, MD

## 2018-03-09 NOTE — Progress Notes (Signed)
Upon entering room, patient awake; Pt still hasn't eaten and doesn't want to eat; therefore, RN cannot obtain 1 hr PP blood sugar tonight. Informed patient that it's important for her to eat a well balanced diet, especially when taking medicine; pt verbalizes understanding but will wait until morning to eat.

## 2018-08-15 IMAGING — CT CT ABD-PELV W/ CM
1 of 3 series · 13 of 32 positions shown, 18 images · IV contrast (OMNIPAQUE)
Comparison: 09/03/2016

CLINICAL DATA: Abdominal pain after C-section yesterday.

EXAM:
CT ABDOMEN AND PELVIS WITH CONTRAST
TECHNIQUE: Multidetector CT imaging of the abdomen and pelvis was performed
using the standard protocol following bolus administration of
intravenous contrast.
CONTRAST:  100mL [id number] IOPAMIDOL ([id number]) INJECTION 61%

[Series 2: routine abdomen/pelvis with · axial · 0.76mm/px · z∈[+730,+1146]mm · 13 of 93 slices shown, 18 images]
[im 5/93  soft-tissue]
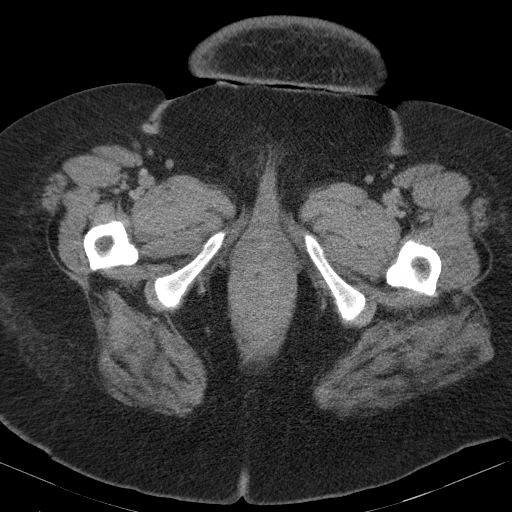
[im 5/93  bone]
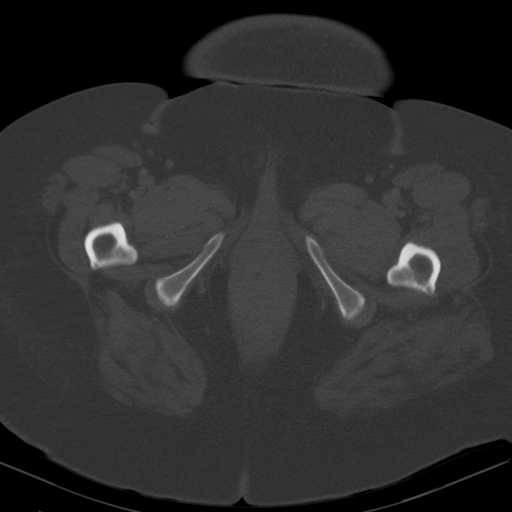
[im 15/93  soft-tissue]
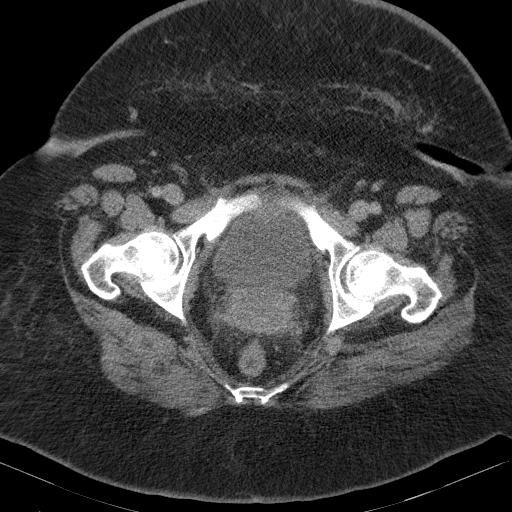
[im 20/93  soft-tissue]
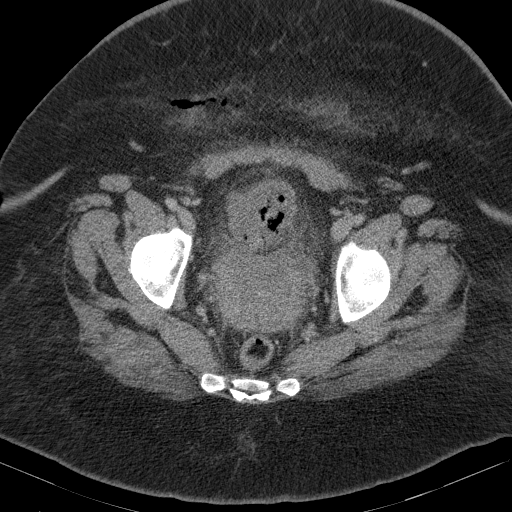
[im 30/93  soft-tissue]
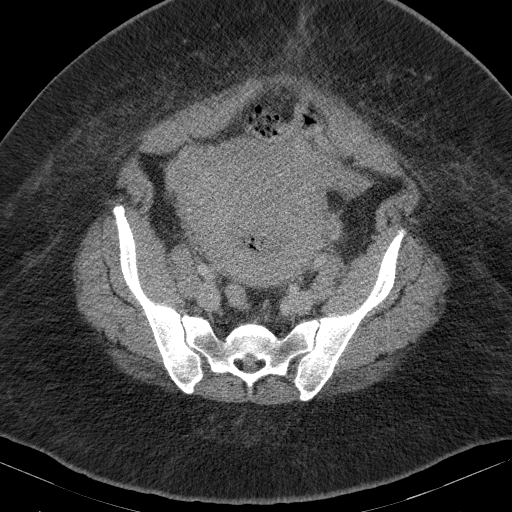
[im 34/93  soft-tissue]
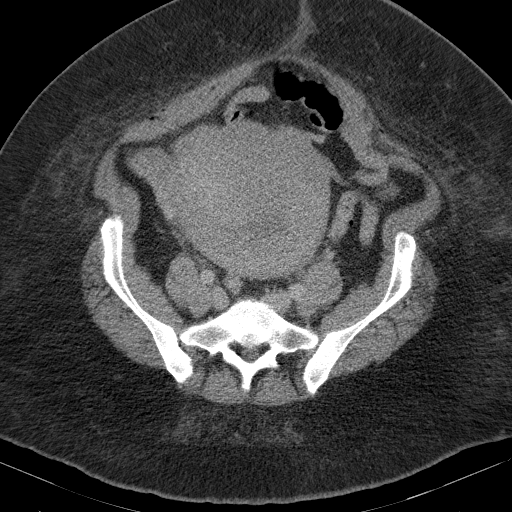
[im 44/93  soft-tissue]
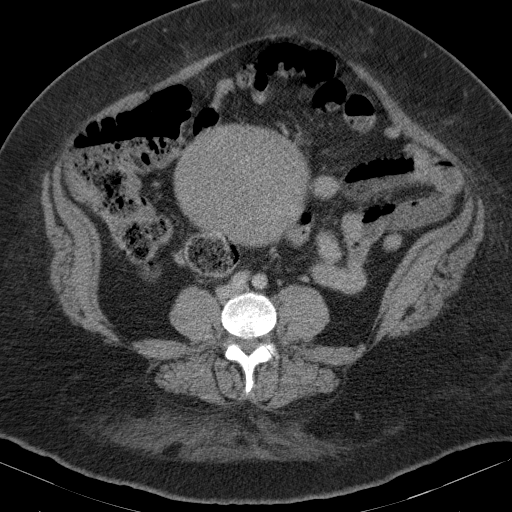
[im 49/93  soft-tissue]
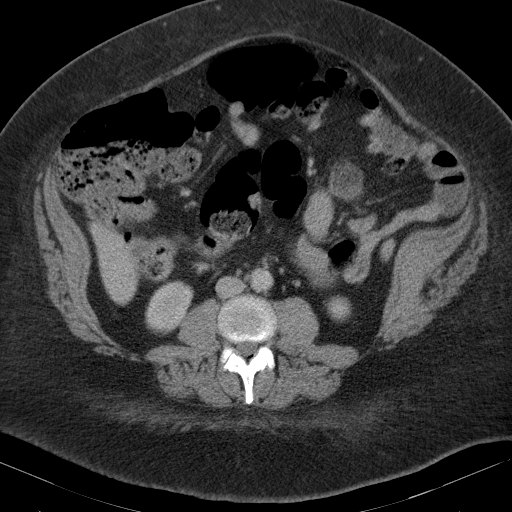
[im 59/93  soft-tissue]
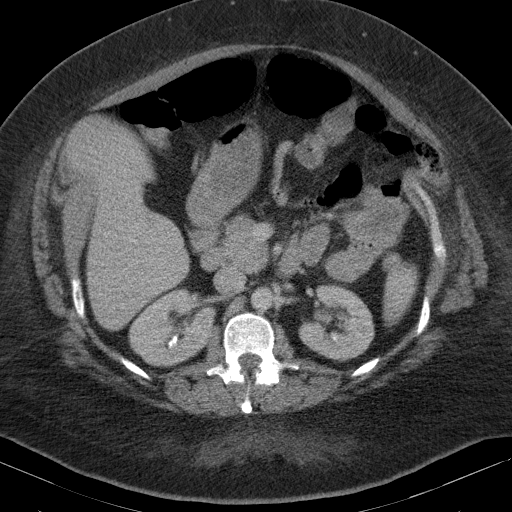
[im 63/93  soft-tissue]
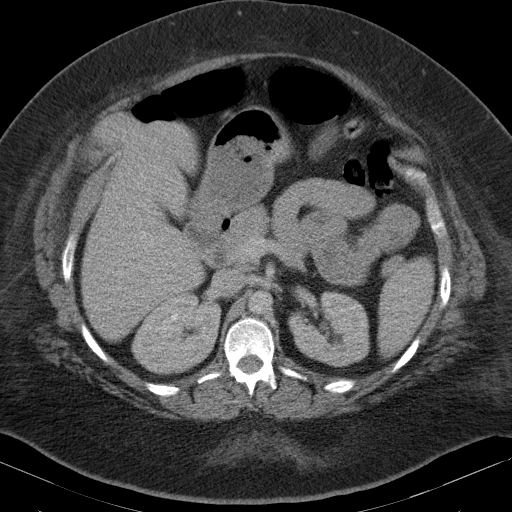
[im 63/93  bone]
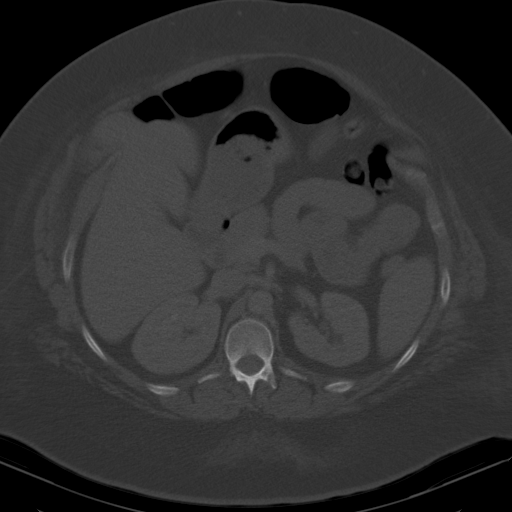
[im 73/93  soft-tissue]
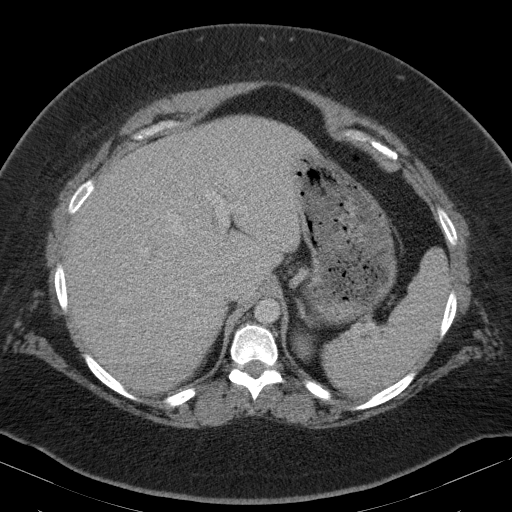
[im 73/93  lung]
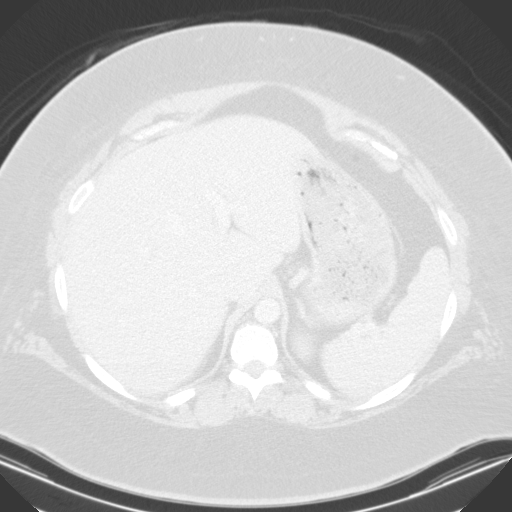
[im 78/93  soft-tissue]
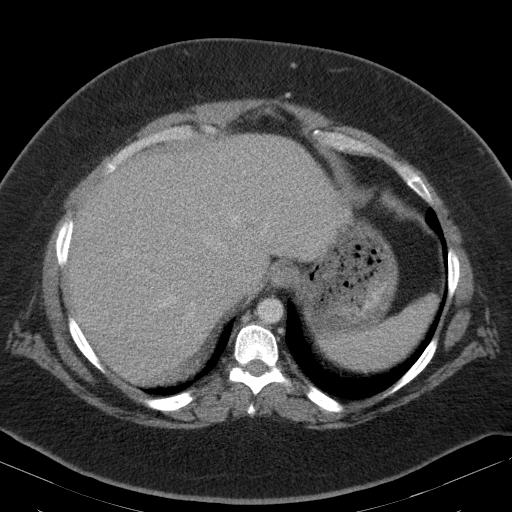
[im 78/93  lung]
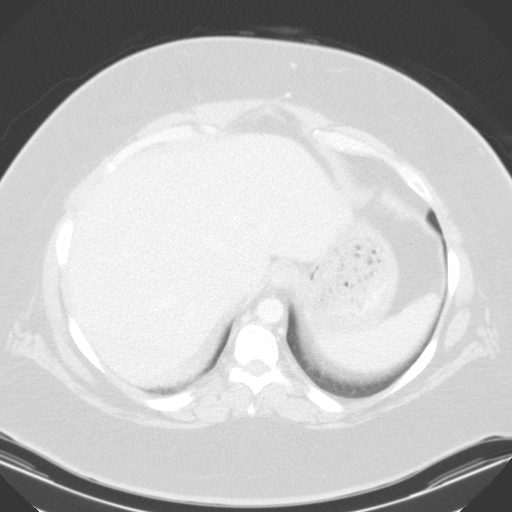
[im 83/93  lung]
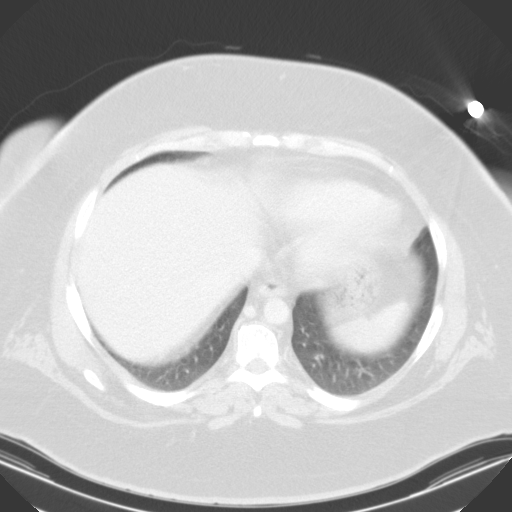
[im 88/93  soft-tissue]
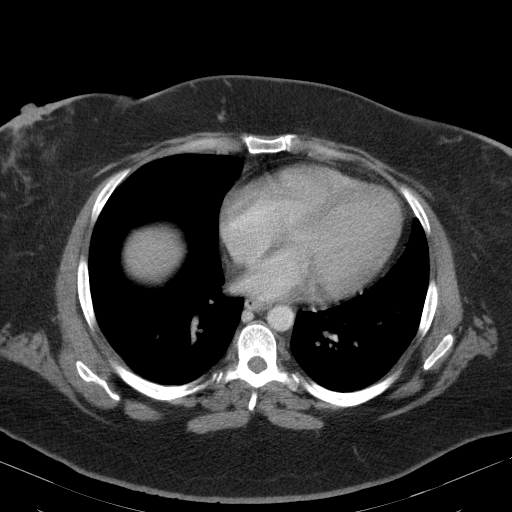
[im 88/93  lung]
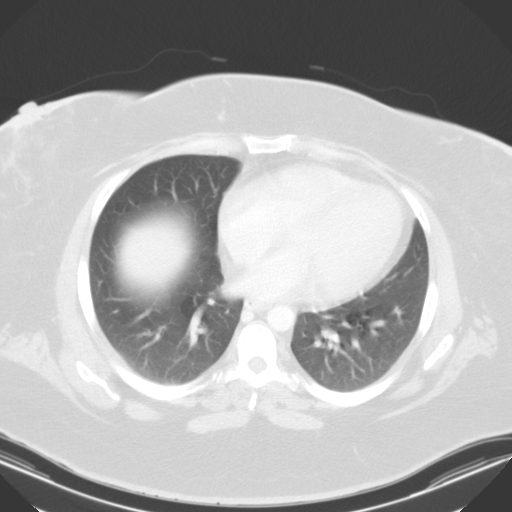

[13 of 32 positions shown; findings below may reference images not displayed]

FINDINGS: Lower chest: Unremarkable

Hepatobiliary: No focal abnormality within the liver parenchyma.
Gallbladder surgically absent. No intrahepatic or extrahepatic
biliary dilation.

Pancreas: No focal mass lesion. No dilatation of the main duct. No
intraparenchymal cyst. No peripancreatic edema.

Spleen: No splenomegaly. No focal mass lesion.

Adrenals/Urinary Tract: No adrenal nodule or mass. Kidneys
unremarkable. No evidence for hydroureter. The urinary bladder
appears normal for the degree of distention. There is no evidence
for bladder wall thickening.

Stomach/Bowel: Stomach is nondistended. No gastric wall thickening.
No evidence of outlet obstruction. Duodenum is normally positioned
as is the ligament of Treitz. No small bowel wall thickening. No
small bowel dilatation. The terminal ileum is normal. The appendix
is normal. No gross colonic mass. No colonic wall thickening. No
substantial diverticular change.

Vascular/Lymphatic: No abdominal aortic aneurysm. No abdominal
aortic atherosclerotic calcification. There is no gastrohepatic or
hepatoduodenal ligament lymphadenopathy. No intraperitoneal or
retroperitoneal lymphadenopathy. No pelvic sidewall lymphadenopathy.

Reproductive: Uterus is enlarged compatible with the postpartum
state. There is some gas in the endometrial canal of the lower
uterine segment. Small amount a gas and fluid/debris is identified
in the anterior wall of the lower uterine segment compatible with
caesarian section yesterday. Anterior uterine myometrium appears
thicker than the posterior myometrium and a component of hemorrhage
is not excluded. No adnexal mass.

Other: No free fluid in the pelvis.

Musculoskeletal: No worrisome lytic or sclerotic osseous
abnormality. There is some gas and minimal edema in the lower
anterior abdominal wall compatible with low transverse incision and
expected findings on postoperative day 1..
IMPRESSION: 1. No gross abnormality identified in the abdomen or pelvis. No
intraperitoneal free fluid. No evidence for pelvic hematoma or focal
pelvic fluid collection.
2. Trace gas is identified in the endometrial canal and there is
some minimal gas/debris in the anterior wall of the lower uterine
segment, compatible with reported surgery yesterday. There is some
asymmetry of the anterior and posterior myometrium and while
indeterminate, some hemorrhage into the anterior myometrium is
possible.
3. No bladder wall abnormality. No evidence for bladder wall
hematoma.
4. Trace gas and edema in the lower anterior abdominal wall
compatible expected appearance of low transverse incision on
postoperative day 1.

## 2018-08-26 DIAGNOSIS — F3341 Major depressive disorder, recurrent, in partial remission: Secondary | ICD-10-CM | POA: Insufficient documentation

## 2018-10-19 ENCOUNTER — Ambulatory Visit (HOSPITAL_COMMUNITY)
Admission: EM | Admit: 2018-10-19 | Discharge: 2018-10-19 | Disposition: A | Discharge status: Home / Self Care | Payer: Medicaid Other | Attending: Family Medicine | Admitting: Family Medicine

## 2018-10-19 ENCOUNTER — Encounter (HOSPITAL_COMMUNITY): Payer: Self-pay

## 2018-10-19 ENCOUNTER — Other Ambulatory Visit: Payer: Self-pay

## 2018-10-19 DIAGNOSIS — S01511A Laceration without foreign body of lip, initial encounter: Secondary | ICD-10-CM

## 2018-10-19 DIAGNOSIS — W010XXA Fall on same level from slipping, tripping and stumbling without subsequent striking against object, initial encounter: Secondary | ICD-10-CM

## 2018-10-19 DIAGNOSIS — W19XXXA Unspecified fall, initial encounter: Secondary | ICD-10-CM

## 2018-10-19 MED ORDER — CLINDAMYCIN HCL 150 MG PO CAPS: 450.0000 mg | ORAL_CAPSULE | Freq: Three times a day (TID) | ORAL | 0 refills | Status: AC

## 2018-10-19 NOTE — Discharge Instructions (Addendum)
Keep adhesive in place as long as possible, goal of 5 days, it will peel off.  May keep covered to keep protected. Irrigate inside of your mouth after eating especially to keep clean.  Complete prophylactic antibiotics.  Return to be seen for any signs of infection such as redness, increased pain or thick drainage.

## 2018-10-19 NOTE — ED Triage Notes (Signed)
Pt presents to [facility] for fall today, pt states she fell today while taking out trash and cut lip. Pt has taking OTC medications for pain.

## 2018-10-20 NOTE — ED Provider Notes (Signed)
[facility]    CSN: [id number] Arrival date & time: 10/19/18  1844     History   Chief Complaint Chief Complaint  Patient presents with  . Fall    HPI Grace Fisher is a 43 y.o. female.   Grace Fisher presents with complaints of laceration to left upper lip after a fall prior to arrival today. She was taking out her trash and tripped and fell, striking her face on the grass and asphalt. Didn't lose consciousness. Ambulatory following incident. No neck pain. No other pain. No broken teeth or malocclusion. Tdap with pregnancy, her child is 105mo. Wiped the area clean and applied vaseline prior to arrival. Hx of reflux, anxiety, asthma, bronchitis, htn. No diabetes.     ROS per HPI, negative if not otherwise mentioned.      Past Medical History:  Diagnosis Date  . Acid reflux   . AMA (advanced maternal age) multigravida 24+   . Anxiety   . Asthma   . Bilateral carpal tunnel syndrome   . Bronchitis 11/2012  . Complication of anesthesia    Had post-op fever (100 - 101) and SOB after anesthesia  . Constipation, chronic   . Depression   . Family history of anesthesia complication    Son also has difficult time breathing after anesthesia  . Gestational diabetes    metformin  . Gestational diabetes mellitus (GDM) affecting fourth pregnancy 03/04/2018  . Hypertension   . Insomnia   . Maternal obesity syndrome, antepartum, third trimester 03/04/2018  . Melanoma ([place])    on back and arms  . Mental disorder   . Mental disorder affecting pregnancy in third trimester 03/04/2018  . Migraines   . Obesity   . Panic attack as reaction to stress   . PIH (pregnancy induced hypertension), third trimester 03/04/2018  . PONV (postoperative nausea and vomiting)   . Renal stones t  . S/P cesarean section 03/05/2018  . Seasonal allergies   . Shingles   . Shortness of breath   . Urinary frequency    difficulty emptying bladder completely    Patient Active Problem List   Diagnosis Date Noted  . PIH (pregnancy induced hypertension) 03/05/2018  . S/P cesarean section 03/05/2018  . PIH (pregnancy induced hypertension), third trimester 03/04/2018  . Maternal obesity syndrome, antepartum, third trimester 03/04/2018  . Gestational diabetes mellitus (GDM) affecting fourth pregnancy 03/04/2018  . Mental disorder affecting pregnancy in third trimester 03/04/2018  . Abnormal glucose tolerance test (GTT) during pregnancy, antepartum 01/20/2018  . Chronic migraine without aura, with intractable migraine, so stated, with status migrainosus 02/08/2016  . SINUSITIS, ACUTE 07/06/2009  . HEMATURIA UNSPECIFIED 05/08/2009  . ALLERGIC RHINITIS 01/30/2009  . HEMANGIOMA 01/05/2009  . ASTHMA 01/05/2009  . PNEUMONIA 12/23/2008  . BACK PAIN 10/06/2008  . MORBID OBESITY 02/19/2008  . OBSESSIVE-COMPULSIVE DISORDERS 02/19/2008  . MIGRAINE HEADACHE 02/19/2008  . GERD 02/19/2008  . CHOLELITHIASIS 02/19/2008  . OVARIAN CYST 02/19/2008  . CHOLELITHIASIS, HX OF 02/19/2008  . BIPOLAR DISORDER UNSPECIFIED 08/05/2005  . DIABETES MELLITUS, GESTATIONAL, HX OF 01/04/2005  . CARPAL TUNNEL SYNDROME, BILATERAL 08/06/2003    Past Surgical History:  Procedure Laterality Date  . CESAREAN SECTION N/A 03/05/2018   Procedure: CESAREAN SECTION;  Surgeon: Janyth Contes, MD;  Location: [facility];  Service: Obstetrics;  Laterality: N/A;  . CHOLECYSTECTOMY    . CYSTOSCOPY WITH STENT PLACEMENT Left 09/29/2015   Procedure: CYSTOSCOPY WITH STENT PLACEMENT;  Surgeon: Raynelle Bring, MD;  Location: [facility];  Service: Urology;  Laterality: Left;  Marland Kitchen MULTIPLE EXTRACTIONS WITH ALVEOLOPLASTY Bilateral 11/30/2012   Procedure: MULTIPLE EXTRACTION ;  Surgeon: Gae Bon, DDS;  Location: [facility];  Service: Oral Surgery;  Laterality: Bilateral;  . WISDOM TOOTH EXTRACTION      OB History    Gravida  4   Para  3   Term  3   Preterm      AB  1   Living  3     SAB      TAB  1    Ectopic      Multiple  0   Live Births  3            Home Medications    Prior to Admission medications   Medication Sig Start Date End Date Taking? Authorizing Provider  acetaminophen (TYLENOL) 500 MG tablet Take 1,000 mg by mouth every 6 (six) hours as needed for headache.   Yes [provider]  albuterol (PROVENTIL HFA;VENTOLIN HFA) 108 (90 BASE) MCG/ACT inhaler Inhale 2 puffs into the lungs every 6 (six) hours as needed for shortness of breath.    Yes [provider]  ALPRAZolam (XANAX) 0.5 MG tablet Take 1 tablet (0.5 mg total) by mouth 2 (two) times daily as needed for anxiety or sleep. 03/09/18  Yes Meisinger, Sherren Mocha, MD  busPIRone (BUSPAR) 10 MG tablet Take 10 mg by mouth 3 (three) times daily.   Yes [provider]  cyclobenzaprine (FLEXERIL) 10 MG tablet take 1 TABLET BY MOUTH EVERY 8 HOURS AS NEEDED for headache pain 11/04/17  Yes [provider]  ibuprofen (ADVIL,MOTRIN) 800 MG tablet Take 1 tablet (800 mg total) by mouth every 8 (eight) hours. 03/09/18  Yes Meisinger, Sherren Mocha, MD  loratadine (CLARITIN) 10 MG tablet Take 10 mg by mouth daily.   Yes [provider]  zolpidem (AMBIEN) 5 MG tablet Take 1 tablet (5 mg total) by mouth at bedtime as needed for sleep. 03/09/18  Yes Meisinger, Sherren Mocha, MD  clindamycin (CLEOCIN) 150 MG capsule Take 3 capsules (450 mg total) by mouth 3 (three) times daily for 5 days. 10/19/18 10/24/18  Zigmund Gottron, NP  labetalol (NORMODYNE) 200 MG tablet Take 1 tablet (200 mg total) by mouth 2 (two) times daily. 03/09/18   Meisinger, Sherren Mocha, MD  oxyCODONE (OXY IR/ROXICODONE) 5 MG immediate release tablet Take 1 tablet (5 mg total) by mouth every 4 (four) hours as needed for severe pain. 03/09/18   Meisinger, Sherren Mocha, MD  Prenatal Vit-Fe Fumarate-FA (PRENATAL MULTIVITAMIN) TABS tablet Take 2 tablets by mouth daily at 12 noon.    [provider]  ranitidine (ZANTAC) 150 MG tablet Take 150 mg by mouth at bedtime.     [provider]  sertraline (ZOLOFT) 50 MG tablet Take 1 tablet by mouth daily. 11/24/17   [provider]    Family History Family History  Problem Relation Age of Onset  . Coronary artery disease Father   . Diabetes Father   . Heart disease Father   . Migraines Neg Hx     Social History Social History   Tobacco Use  . Smoking status: Former Smoker    Last attempt to quit: 11/09/1997    Years since quitting: 20.9  . Smokeless tobacco: Never Used  Substance Use Topics  . Alcohol use: No  . Drug use: No     Allergies   Prednisone; Acetaminophen-codeine; Morphine; Penicillins; and Sulfa antibiotics   Review of Systems Review of Systems  Physical Exam Triage Vital Signs ED Triage Vitals  Enc Vitals Group     BP 10/19/18 1953 122/78     Pulse Rate 10/19/18 1953 98     Resp 10/19/18 1953 17     Temp 10/19/18 1953 98.1 F (36.7 C)     Temp Source 10/19/18 1953 Oral     SpO2 10/19/18 1953 100 %     Weight --      Height --      Head Circumference --      Peak Flow --      Pain Score 10/19/18 1954 6     Pain Loc --      Pain Edu? --      Excl. in [place]? --    No data found.  Updated Vital Signs BP 122/78 (BP Location: Left Arm)   Pulse 98   Temp 98.1 F (36.7 C) (Oral)   Resp 17   LMP 09/23/2018 (Exact Date)   SpO2 100%    Physical Exam Constitutional:      General: She is not in acute distress.    Appearance: Normal appearance. She is well-developed.  HENT:     Head: Laceration present.   Neck:     Musculoskeletal: Full passive range of motion without pain and normal range of motion.  Cardiovascular:     Rate and Rhythm: Normal rate and regular rhythm.     Heart sounds: Normal heart sounds.  Pulmonary:     Effort: Pulmonary effort is normal.     Breath sounds: Normal breath sounds.  Skin:    General: Skin is warm and dry.  Neurological:     Mental Status: She is alert and oriented to person, place, and time.      UC  Treatments / Results  Labs (all labs ordered are listed, but only abnormal results are displayed) Labs Reviewed - No data to display  EKG None  Radiology No results found.  Procedures Laceration Repair Date/Time: 10/20/2018 1:42 PM Performed by: Zigmund Gottron, NP Authorized by: Vanessa Kick, MD   Consent:    Consent obtained:  Verbal   Consent given by:  Patient   Risks discussed:  Infection, pain, poor cosmetic result and retained foreign body   Alternatives discussed:  Observation and referral Anesthesia (see MAR for exact dosages):    Anesthesia method:  None Laceration details:    Location:  Lip   Lip location:  Upper lip, full thickness   Vermilion border involved: no     Length (cm):  1 (v shaped)   Depth (mm):  7 Repair type:    Repair type:  Simple Exploration:    Wound exploration: wound explored through full range of motion     Wound extent: no foreign bodies/material noted     Contaminated: no   Treatment:    Area cleansed with:  Betadine and saline   Amount of cleaning:  Standard Skin repair:    Repair method:  Tissue adhesive (patient declined suture closing, requesting dermabond only) Approximation:    Approximation:  Close   Vermilion border: well-aligned   Post-procedure details:    Dressing:  Adhesive bandage Comments:     Close to fairly close approximation of external wound with dermabond, internal lip remains open   (including critical care time)  Medications Ordered in UC Medications - No data to display  Initial Impression / Assessment and Plan / UC Course  I have reviewed the triage vital signs and  the nursing notes.  Pertinent labs & imaging results that were available during my care of the patient were reviewed by me and considered in my medical decision making (see chart for details).     tdap up to date. Through and through lip laceration from fall. Patient emphasizing she does not want sutures and only wants glue, aware of  the cosmetic implications. Prophylactic clinda provided. Return precautions provided as well as wound care. Patient verbalized understanding and agreeable to plan.   Final Clinical Impressions(s) / UC Diagnoses   Final diagnoses:  Lip laceration, initial encounter  Fall, initial encounter     Discharge Instructions     Keep adhesive in place as long as possible, goal of 5 days, it will peel off.  May keep covered to keep protected. Irrigate inside of your mouth after eating especially to keep clean.  Complete prophylactic antibiotics.  Return to be seen for any signs of infection such as redness, increased pain or thick drainage.    ED Prescriptions    Medication Sig Dispense Auth. Provider   clindamycin (CLEOCIN) 150 MG capsule Take 3 capsules (450 mg total) by mouth 3 (three) times daily for 5 days. 45 capsule Zigmund Gottron, NP     Controlled Substance Prescriptions [place] Controlled Substance Registry consulted? Not Applicable   Zigmund Gottron, NP 10/20/18 1352

## 2019-12-06 ENCOUNTER — Other Ambulatory Visit: Payer: Self-pay

## 2019-12-06 ENCOUNTER — Encounter (HOSPITAL_COMMUNITY): Payer: Self-pay

## 2019-12-06 ENCOUNTER — Ambulatory Visit (HOSPITAL_COMMUNITY)
Admission: EM | Admit: 2019-12-06 | Discharge: 2019-12-06 | Disposition: A | Discharge status: Home / Self Care | Payer: Medicaid Other

## 2019-12-06 DIAGNOSIS — K047 Periapical abscess without sinus: Secondary | ICD-10-CM

## 2019-12-06 DIAGNOSIS — R220 Localized swelling, mass and lump, head: Secondary | ICD-10-CM

## 2019-12-06 DIAGNOSIS — R519 Headache, unspecified: Secondary | ICD-10-CM

## 2019-12-06 DIAGNOSIS — K0889 Other specified disorders of teeth and supporting structures: Secondary | ICD-10-CM | POA: Diagnosis not present

## 2019-12-06 MED ORDER — CLINDAMYCIN HCL 300 MG PO CAPS: 300.0000 mg | ORAL_CAPSULE | Freq: Three times a day (TID) | ORAL | 0 refills | Status: AC

## 2019-12-06 NOTE — ED Triage Notes (Signed)
Pt c/o 9/10 sharp pain in tooth in right upper mouth started yesterday. Pt has 1+ edema of right side of face. Pt c/o trouble chewing and dysphagia.

## 2019-12-06 NOTE — ED Provider Notes (Signed)
[place]   MRN: [id number] DOB: 08/29/1975  Subjective:   Grace Fisher is a 44 y.o. female presenting for 1 day history of acute onset recurrent right upper facial pain, swelling and dental pain with difficulty swallowing and chewing.  Patient states that she has a history of significant dental infections requiring surgeries and removal.  Unfortunately, she has not been able to get this one situated due to Covid precautions and also her history of significant fear of dental surgery.  Patient is currently on doxycycline, started this last week for sinus infection.  She is unable to take any kind of steroids due to history of severe reaction.  No current facility-administered medications for this encounter.  Current Outpatient Medications:  .  doxycycline (VIBRA-TABS) 100 MG tablet, Take 100 mg by mouth., Disp: , Rfl:  .  acetaminophen (TYLENOL) 500 MG tablet, Take 1,000 mg by mouth every 6 (six) hours as needed for headache., Disp: , Rfl:  .  albuterol (PROVENTIL HFA;VENTOLIN HFA) 108 (90 BASE) MCG/ACT inhaler, Inhale 2 puffs into the lungs every 6 (six) hours as needed for shortness of breath. , Disp: , Rfl:  .  ALPRAZolam (XANAX) 0.5 MG tablet, Take 1 tablet (0.5 mg total) by mouth 2 (two) times daily as needed for anxiety or sleep., Disp: 20 tablet, Rfl: 0 .  busPIRone (BUSPAR) 10 MG tablet, Take 10 mg by mouth 3 (three) times daily., Disp: , Rfl:  .  cyclobenzaprine (FLEXERIL) 10 MG tablet, take 1 TABLET BY MOUTH EVERY 8 HOURS AS NEEDED for headache pain, Disp: , Rfl: 2 .  ibuprofen (ADVIL,MOTRIN) 800 MG tablet, Take 1 tablet (800 mg total) by mouth every 8 (eight) hours., Disp: 30 tablet, Rfl: 0 .  labetalol (NORMODYNE) 200 MG tablet, Take 1 tablet (200 mg total) by mouth 2 (two) times daily., Disp: 30 tablet, Rfl: 1 .  loratadine (CLARITIN) 10 MG tablet, Take 10 mg by mouth daily., Disp: , Rfl:  .  oxyCODONE (OXY IR/ROXICODONE) 5 MG immediate release tablet, Take 1 tablet  (5 mg total) by mouth every 4 (four) hours as needed for severe pain., Disp: 20 tablet, Rfl: 0 .  Prenatal Vit-Fe Fumarate-FA (PRENATAL MULTIVITAMIN) TABS tablet, Take 2 tablets by mouth daily at 12 noon., Disp: , Rfl:  .  ranitidine (ZANTAC) 150 MG tablet, Take 150 mg by mouth at bedtime., Disp: , Rfl:  .  sertraline (ZOLOFT) 50 MG tablet, Take 1 tablet by mouth daily., Disp: , Rfl: 12 .  zolpidem (AMBIEN) 5 MG tablet, Take 1 tablet (5 mg total) by mouth at bedtime as needed for sleep., Disp: 20 tablet, Rfl: 0   Allergies  Allergen Reactions  . Prednisone Rash and Swelling  . Acetaminophen-Codeine Other (See Comments)    severe migraine headache  . Morphine Hives and Rash  . Penicillins Rash    Has patient had a PCN reaction causing immediate rash, facial/tongue/throat swelling, SOB or lightheadedness with hypotension: No Has patient had a PCN reaction causing severe rash involving mucus membranes or skin necrosis: No Has patient had a PCN reaction that required hospitalization No Has patient had a PCN reaction occurring within the last 10 years: No If all of the above answers are "NO", then may proceed with Cephalosporin use.  . Sulfa Antibiotics Palpitations and Other (See Comments)    REACTION:  fever    Past Medical History:  Diagnosis Date  . Acid reflux   . AMA (advanced maternal age) multigravida 68+   .  Anxiety   . Asthma   . Bilateral carpal tunnel syndrome   . Bronchitis 11/2012  . Complication of anesthesia    Had post-op fever (100 - 101) and SOB after anesthesia  . Constipation, chronic   . Depression   . Family history of anesthesia complication    Son also has difficult time breathing after anesthesia  . Gestational diabetes    metformin  . Gestational diabetes mellitus (GDM) affecting fourth pregnancy 03/04/2018  . Hypertension   . Insomnia   . Maternal obesity syndrome, antepartum, third trimester 03/04/2018  . Melanoma ([place])    on back and arms  . Mental  disorder   . Mental disorder affecting pregnancy in third trimester 03/04/2018  . Migraines   . Obesity   . Panic attack as reaction to stress   . PIH (pregnancy induced hypertension), third trimester 03/04/2018  . PONV (postoperative nausea and vomiting)   . Renal stones t  . S/P cesarean section 03/05/2018  . Seasonal allergies   . Shingles   . Shortness of breath   . Urinary frequency    difficulty emptying bladder completely     Past Surgical History:  Procedure Laterality Date  . CESAREAN SECTION N/A 03/05/2018   Procedure: CESAREAN SECTION;  Surgeon: Janyth Contes, MD;  Location: [facility];  Service: Obstetrics;  Laterality: N/A;  . CHOLECYSTECTOMY    . CYSTOSCOPY WITH STENT PLACEMENT Left 09/29/2015   Procedure: CYSTOSCOPY WITH STENT PLACEMENT;  Surgeon: Raynelle Bring, MD;  Location: [facility];  Service: Urology;  Laterality: Left;  Marland Kitchen MULTIPLE EXTRACTIONS WITH ALVEOLOPLASTY Bilateral 11/30/2012   Procedure: MULTIPLE EXTRACTION ;  Surgeon: Gae Bon, DDS;  Location: [facility];  Service: Oral Surgery;  Laterality: Bilateral;  . WISDOM TOOTH EXTRACTION      Family History  Problem Relation Age of Onset  . Coronary artery disease Father   . Diabetes Father   . Heart disease Father   . Migraines Neg Hx     Social History   Tobacco Use  . Smoking status: Former Smoker    Quit date: 11/09/1997    Years since quitting: 22.0  . Smokeless tobacco: Never Used  Substance Use Topics  . Alcohol use: No  . Drug use: No    ROS   Objective:   Vitals: BP 132/60   Pulse 85   Temp 98.4 F (36.9 C) (Oral)   Resp 16   Ht 5\' 3"  (1.6 m)   Wt 290 lb (131.5 kg)   SpO2 97%   BMI 51.37 kg/m   Physical Exam Constitutional:      General: She is not in acute distress.    Appearance: Normal appearance. She is well-developed. She is obese. She is not ill-appearing, toxic-appearing or diaphoretic.  HENT:     Head: Normocephalic and atraumatic.      Nose: Nose normal.      Mouth/Throat:     Mouth: Mucous membranes are moist.     Dentition: Dental tenderness, dental caries (multiple molars, remaining incisors) and dental abscesses present.     Pharynx: Oropharynx is clear.   Eyes:     General: No scleral icterus.    Extraocular Movements: Extraocular movements intact.     Pupils: Pupils are equal, round, and reactive to light.  Cardiovascular:     Rate and Rhythm: Normal rate.  Pulmonary:     Effort: Pulmonary effort is normal.  Skin:    General: Skin is warm and dry.  Neurological:     General: No focal deficit present.     Mental Status: She is alert and oriented to person, place, and time.  Psychiatric:        Mood and Affect: Mood normal.        Behavior: Behavior normal.        Thought Content: Thought content normal.        Judgment: Judgment normal.      Assessment and Plan :   PDMP not reviewed this encounter.  1. Pain, dental   2. Dental infection   3. Facial pain   4. Facial swelling     Clindamycin for dental infection.  Contact dental surgeon ASAP for further management.  Use naproxen for pain and inflammation. Counseled patient on potential for adverse effects with medications prescribed/recommended today, ER and return-to-clinic precautions discussed, patient verbalized understanding.    Jaynee Eagles, PA-C 12/06/19 2000

## 2020-02-28 NOTE — H&P (Signed)
HISTORY AND PHYSICAL  Grace Fisher is a 44 y.o. female patient with CC:CC: painful upper right teeth. Severe dental anxiety.     No diagnosis found.  Past Medical History:  Diagnosis Date  . Acid reflux   . AMA (advanced maternal age) multigravida 33+   . Anxiety   . Asthma   . Bilateral carpal tunnel syndrome   . Bronchitis 11/2012  . Complication of anesthesia    Had post-op fever (100 - 101) and SOB after anesthesia  . Constipation, chronic   . Depression   . Family history of anesthesia complication    Son also has difficult time breathing after anesthesia  . Gestational diabetes    metformin  . Gestational diabetes mellitus (GDM) affecting fourth pregnancy 03/04/2018  . Hypertension   . Insomnia   . Maternal obesity syndrome, antepartum, third trimester 03/04/2018  . Melanoma ([place])    on back and arms  . Mental disorder   . Mental disorder affecting pregnancy in third trimester 03/04/2018  . Migraines   . Obesity   . Panic attack as reaction to stress   . PIH (pregnancy induced hypertension), third trimester 03/04/2018  . PONV (postoperative nausea and vomiting)   . Renal stones t  . S/P cesarean section 03/05/2018  . Seasonal allergies   . Shingles   . Shortness of breath   . Urinary frequency    difficulty emptying bladder completely    No current facility-administered medications for this encounter.   Current Outpatient Medications  Medication Sig Dispense Refill  . acetaminophen (TYLENOL) 500 MG tablet Take 1,000 mg by mouth every 6 (six) hours as needed for headache.    . albuterol (PROVENTIL HFA;VENTOLIN HFA) 108 (90 BASE) MCG/ACT inhaler Inhale 2 puffs into the lungs every 6 (six) hours as needed for shortness of breath.     . ALPRAZolam (XANAX) 0.5 MG tablet Take 1 tablet (0.5 mg total) by mouth 2 (two) times daily as needed for anxiety or sleep. 20 tablet 0  . busPIRone (BUSPAR) 10 MG tablet Take 10 mg by mouth 3 (three) times daily.    . clindamycin  (CLEOCIN) 300 MG capsule Take 1 capsule (300 mg total) by mouth 3 (three) times daily. 30 capsule 0  . cyclobenzaprine (FLEXERIL) 10 MG tablet take 1 TABLET BY MOUTH EVERY 8 HOURS AS NEEDED for headache pain  2  . ibuprofen (ADVIL,MOTRIN) 800 MG tablet Take 1 tablet (800 mg total) by mouth every 8 (eight) hours. 30 tablet 0  . labetalol (NORMODYNE) 200 MG tablet Take 1 tablet (200 mg total) by mouth 2 (two) times daily. 30 tablet 1  . loratadine (CLARITIN) 10 MG tablet Take 10 mg by mouth daily.    Marland Kitchen oxyCODONE (OXY IR/ROXICODONE) 5 MG immediate release tablet Take 1 tablet (5 mg total) by mouth every 4 (four) hours as needed for severe pain. 20 tablet 0  . Prenatal Vit-Fe Fumarate-FA (PRENATAL MULTIVITAMIN) TABS tablet Take 2 tablets by mouth daily at 12 noon.    . ranitidine (ZANTAC) 150 MG tablet Take 150 mg by mouth at bedtime.    . sertraline (ZOLOFT) 50 MG tablet Take 1 tablet by mouth daily.  12  . zolpidem (AMBIEN) 5 MG tablet Take 1 tablet (5 mg total) by mouth at bedtime as needed for sleep. 20 tablet 0   Allergies  Allergen Reactions  . Prednisone Rash and Swelling  . Acetaminophen-Codeine Other (See Comments)    severe migraine headache  . Morphine  Hives and Rash  . Penicillins Rash    Has patient had a PCN reaction causing immediate rash, facial/tongue/throat swelling, SOB or lightheadedness with hypotension: No Has patient had a PCN reaction causing severe rash involving mucus membranes or skin necrosis: No Has patient had a PCN reaction that required hospitalization No Has patient had a PCN reaction occurring within the last 10 years: No If all of the above answers are "NO", then may proceed with Cephalosporin use.  . Sulfa Antibiotics Palpitations and Other (See Comments)    REACTION:  fever   Active Problems:   * No active hospital problems. *  Vitals: unknown if currently breastfeeding. Lab results:No results found for this or any previous visit (from the past 48  hour(s)). Radiology Results: No results found.          Exam: BMI 53. Decay #5, percussion tender. Root remains #6 with decay.   No purulence, edema, fluctuance, trismus. Oral cancer screening negative. Pharynx clear. No lymphadenopathy.  Panorex: Decay #5, percussion tender. Root remains #6 with decay.    Assessment: ASA 3 . Non-restorable teeth #5, 6. Severe dental anxiety.              Plan: Extraction Teeth #  5, 6. Hospital Day surgery.                         Risks and complications explained. Questions answered.      Diona Browner 02/28/2020

## 2020-03-10 ENCOUNTER — Ambulatory Visit: Admit: 2020-03-10 | Payer: Medicaid Other | Admitting: Oral Surgery

## 2020-03-10 SURGERY — DENTAL RESTORATION/EXTRACTIONS
Anesthesia: General

## 2020-05-12 ENCOUNTER — Other Ambulatory Visit (HOSPITAL_COMMUNITY): Payer: Medicaid Other

## 2020-05-16 ENCOUNTER — Ambulatory Visit (HOSPITAL_BASED_OUTPATIENT_CLINIC_OR_DEPARTMENT_OTHER): Admission: RE | Admit: 2020-05-16 | Payer: Medicaid Other | Source: Home / Self Care | Admitting: Oral Surgery

## 2020-05-16 ENCOUNTER — Encounter (HOSPITAL_BASED_OUTPATIENT_CLINIC_OR_DEPARTMENT_OTHER): Admission: RE | Payer: Self-pay | Source: Home / Self Care

## 2020-05-16 SURGERY — DENTAL RESTORATION/EXTRACTIONS
Anesthesia: General

## 2020-10-22 NOTE — Progress Notes (Deleted)
Office Visit Note  Patient: Grace Fisher             Date of Birth: 11-03-1975           MRN: [id number]             PCP: Selinda Orion Referring: Selinda Orion Visit Date: 10/23/2020 Occupation: @GUAROCC @  Subjective:  No chief complaint on file.   History of Present Illness: Grace Fisher is a 45 y.o. female with bipolar disorder, migraines, carpal tunnel syndrome, asthma, GERD, eczema, HTN, insomnia, vitamin D deficiency here for evaluation of joint pains mostly in her hands, wrists, elbows, and shoulders for a few years duration. She takes cymbalta.***  Labs reviewed 09/2020 CBC wnl Calcium 10.7, albumin 2.3 rest CMP wnl TSH 1.170    Activities of Daily Living:  Patient reports morning stiffness for *** {minute/hour:19697}.   Patient {ACTIONS;DENIES/REPORTS:21021675::"Denies"} nocturnal pain.  Difficulty dressing/grooming: {ACTIONS;DENIES/REPORTS:21021675::"Denies"} Difficulty climbing stairs: {ACTIONS;DENIES/REPORTS:21021675::"Denies"} Difficulty getting out of chair: {ACTIONS;DENIES/REPORTS:21021675::"Denies"} Difficulty using hands for taps, buttons, cutlery, and/or writing: {ACTIONS;DENIES/REPORTS:21021675::"Denies"}  No Rheumatology ROS completed.   PMFS History:  Patient Active Problem List   Diagnosis Date Noted  . PIH (pregnancy induced hypertension) 03/05/2018  . S/P cesarean section 03/05/2018  . PIH (pregnancy induced hypertension), third trimester 03/04/2018  . Maternal obesity syndrome, antepartum, third trimester 03/04/2018  . Gestational diabetes mellitus (GDM) affecting fourth pregnancy 03/04/2018  . Mental disorder affecting pregnancy in third trimester 03/04/2018  . Abnormal glucose tolerance test (GTT) during pregnancy, antepartum 01/20/2018  . Chronic migraine without aura, with intractable migraine, so stated, with status migrainosus 02/08/2016  . SINUSITIS, ACUTE 07/06/2009  . HEMATURIA UNSPECIFIED 05/08/2009  .  ALLERGIC RHINITIS 01/30/2009  . HEMANGIOMA 01/05/2009  . ASTHMA 01/05/2009  . PNEUMONIA 12/23/2008  . BACK PAIN 10/06/2008  . MORBID OBESITY 02/19/2008  . OBSESSIVE-COMPULSIVE DISORDERS 02/19/2008  . MIGRAINE HEADACHE 02/19/2008  . GERD 02/19/2008  . CHOLELITHIASIS 02/19/2008  . OVARIAN CYST 02/19/2008  . CHOLELITHIASIS, HX OF 02/19/2008  . BIPOLAR DISORDER UNSPECIFIED 08/05/2005  . DIABETES MELLITUS, GESTATIONAL, HX OF 01/04/2005  . CARPAL TUNNEL SYNDROME, BILATERAL 08/06/2003    Past Medical History:  Diagnosis Date  . Acid reflux   . AMA (advanced maternal age) multigravida 34+   . Anxiety   . Asthma   . Bilateral carpal tunnel syndrome   . Bronchitis 11/2012  . Complication of anesthesia    Had post-op fever (100 - 101) and SOB after anesthesia  . Constipation, chronic   . Depression   . Family history of anesthesia complication    Son also has difficult time breathing after anesthesia  . Gestational diabetes    metformin  . Gestational diabetes mellitus (GDM) affecting fourth pregnancy 03/04/2018  . Hypertension   . Insomnia   . Maternal obesity syndrome, antepartum, third trimester 03/04/2018  . Melanoma ([place])    on back and arms  . Mental disorder   . Mental disorder affecting pregnancy in third trimester 03/04/2018  . Migraines   . Obesity   . Panic attack as reaction to stress   . PIH (pregnancy induced hypertension), third trimester 03/04/2018  . PONV (postoperative nausea and vomiting)   . Renal stones t  . S/P cesarean section 03/05/2018  . Seasonal allergies   . Shingles   . Shortness of breath   . Urinary frequency    difficulty emptying bladder completely    Family History  Problem Relation Age of Onset  . Coronary  artery disease Father   . Diabetes Father   . Heart disease Father   . Migraines Neg Hx    Past Surgical History:  Procedure Laterality Date  . CESAREAN SECTION N/A 03/05/2018   Procedure: CESAREAN SECTION;  Surgeon: Janyth Contes, MD;  Location: [facility];  Service: Obstetrics;  Laterality: N/A;  . CHOLECYSTECTOMY    . CYSTOSCOPY WITH STENT PLACEMENT Left 09/29/2015   Procedure: CYSTOSCOPY WITH STENT PLACEMENT;  Surgeon: Raynelle Bring, MD;  Location: [facility];  Service: Urology;  Laterality: Left;  Marland Kitchen MULTIPLE EXTRACTIONS WITH ALVEOLOPLASTY Bilateral 11/30/2012   Procedure: MULTIPLE EXTRACTION ;  Surgeon: Gae Bon, DDS;  Location: [facility];  Service: Oral Surgery;  Laterality: Bilateral;  . WISDOM TOOTH EXTRACTION     Social History   Social History Narrative   Lives w/ fiance and 2 children   Caffeine use: 1-2 glasses per day (tea)   Immunization History  Administered Date(s) Administered  . Td 11/04/2006     Objective: Vital Signs: There were no vitals taken for this visit.   Physical Exam   Musculoskeletal Exam: ***  CDAI Exam: CDAI Score: - Patient Global: -; Provider Global: - Swollen: -; Tender: - Joint Exam 10/23/2020   No joint exam has been documented for this visit   There is currently no information documented on the homunculus. Go to the Rheumatology activity and complete the homunculus joint exam.  Investigation: No additional findings.  Imaging: No results found.  Recent Labs: Lab Results  Component Value Date   WBC 17.1 (H) 03/07/2018   HGB 9.6 (L) 03/07/2018   PLT 290 03/07/2018   NA 133 (L) 03/05/2018   K 4.2 03/05/2018   CL 102 03/05/2018   CO2 21 (L) 03/05/2018   GLUCOSE 117 (H) 03/05/2018   BUN 12 03/05/2018   CREATININE 0.77 03/05/2018   BILITOT 0.3 03/05/2018   ALKPHOS 193 (H) 03/05/2018   AST 21 03/05/2018   ALT 19 03/05/2018   PROT 7.0 03/05/2018   ALBUMIN 3.0 (L) 03/05/2018   CALCIUM 10.4 (H) 03/05/2018   GFRAA >60 03/05/2018    Speciality Comments: No specialty comments available.  Procedures:  No procedures performed Allergies: Prednisone, Acetaminophen-codeine, Morphine, Penicillins, and Sulfa antibiotics   Assessment / Plan:      Visit Diagnoses: No diagnosis found.  Orders: No orders of the defined types were placed in this encounter.  No orders of the defined types were placed in this encounter.   Face-to-face time spent with patient was *** minutes. Greater than 50% of time was spent in counseling and coordination of care.  Follow-Up Instructions: No follow-ups on file.   Collier Salina, MD  Note - This record has been created using [employer].  Chart creation errors have been sought, but may not always  have been located. Such creation errors do not reflect on  the standard of medical care.

## 2020-10-23 ENCOUNTER — Ambulatory Visit: Payer: Medicaid Other | Admitting: Internal Medicine

## 2020-11-22 NOTE — Progress Notes (Signed)
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 Patient complains of bilateral calf cramping. )   History of Present Illness: ZAMERIA VOGL is a 45 y.o. female here for evaluation of arthralgias. She has chronic joint pain in multiple areas for years but particularly noticing more problems after the birth of her youngest child 2 years ago. She previously suffered from carpal tunnel syndrome treated with custom fitted wrist braces worn overnight and sometimes in the day but does not have these anymore. She has numbness and pain radiating to the fingers and the elbow similar to her previous symptoms. She now also has pain at the base of the thumb on both hands which is new. Additionally bilateral knee pain is increased especially with rising from the floor and low seats. She also notices frequent cramping of her calf muscles with what feels like painful bending of a toe or at the ankle. She has actually lost some weight to below her pregnancy weight.  Activities of Daily Living:  Patient reports morning stiffness for 24 hours.   Patient Reports nocturnal pain.  Difficulty dressing/grooming: Reports Difficulty climbing stairs: Reports Difficulty getting out of chair: Reports Difficulty using hands for taps, buttons, cutlery, and/or writing: Reports  Review of Systems  Constitutional: Positive for fatigue.  HENT: Negative for mouth sores, mouth dryness and nose dryness.   Eyes: Positive for pain, itching, visual disturbance and dryness.  Respiratory: Positive for shortness of breath and difficulty breathing. Negative for cough and  hemoptysis.   Cardiovascular: Positive for chest pain, palpitations and swelling in legs/feet.  Gastrointestinal: Positive for constipation and diarrhea. Negative for abdominal pain and blood in stool.  Endocrine: Negative for increased urination.  Genitourinary: Negative for painful urination.  Musculoskeletal: Positive for arthralgias, joint pain, joint swelling, myalgias, muscle weakness, morning stiffness, muscle tenderness and myalgias.  Skin: Positive for redness. Negative for color change and rash.  Allergic/Immunologic: Negative for susceptible to infections.  Neurological: Positive for dizziness, headaches, memory loss and weakness. Negative for numbness.  Hematological: Negative for swollen glands.  Psychiatric/Behavioral: Positive for confusion. Negative for sleep disturbance.    PMFS History:  Patient Active Problem List   Diagnosis Date Noted  . Menorrhagia 11/23/2020  . Tenosynovitis, de Quervain 11/23/2020  . Bilateral knee pain 11/23/2020  . Recurrent major depressive disorder, in partial remission ([place]) 08/26/2018  . PIH (pregnancy induced hypertension) 03/05/2018  . S/P cesarean section 03/05/2018  . PIH (pregnancy induced hypertension), third trimester 03/04/2018  . Maternal obesity syndrome, antepartum, third trimester 03/04/2018  . Gestational diabetes mellitus (GDM) affecting fourth pregnancy 03/04/2018  . Mental disorder affecting pregnancy in third trimester 03/04/2018  . Abnormal glucose tolerance test (GTT) during pregnancy, antepartum 01/20/2018  . Asthma 08/13/2017  . Chronic migraine without aura, with intractable migraine, so stated, with status migrainosus 02/08/2016  . Kidney stone 08/26/2012  . Eczema 09/10/2011  . Insomnia, unspecified 12/07/2010  . Vitamin D deficiency 08/17/2010  . Essential hypertension 08/16/2010  . SINUSITIS, ACUTE 07/06/2009  . HEMATURIA UNSPECIFIED 05/08/2009  .  ALLERGIC RHINITIS 01/30/2009  . HEMANGIOMA 01/05/2009  .  ASTHMA 01/05/2009  . PNEUMONIA 12/23/2008  . Backache 10/06/2008  . MORBID OBESITY 02/19/2008  . OBSESSIVE-COMPULSIVE DISORDERS 02/19/2008  . MIGRAINE HEADACHE 02/19/2008  . GERD 02/19/2008  . CHOLELITHIASIS 02/19/2008  . OVARIAN CYST 02/19/2008  . CHOLELITHIASIS, HX OF 02/19/2008  . BIPOLAR DISORDER UNSPECIFIED 08/05/2005  . DIABETES MELLITUS, GESTATIONAL, HX OF 01/04/2005  . CARPAL TUNNEL SYNDROME, BILATERAL 08/06/2003    Past Medical History:  Diagnosis Date  . Acid reflux   . AMA (advanced maternal age) multigravida 69+   . Anxiety   . Asthma   . Bilateral carpal tunnel syndrome   . Bronchitis 11/2012  . Complication of anesthesia    Had post-op fever (100 - 101) and SOB after anesthesia  . Constipation, chronic   . Depression   . Family history of anesthesia complication    Son also has difficult time breathing after anesthesia  . Gestational diabetes    metformin  . Gestational diabetes mellitus (GDM) affecting fourth pregnancy 03/04/2018  . Hypertension   . Insomnia   . Maternal obesity syndrome, antepartum, third trimester 03/04/2018  . Melanoma ([place])    on back and arms  . Mental disorder   . Mental disorder affecting pregnancy in third trimester 03/04/2018  . Migraines   . Obesity   . Panic attack as reaction to stress   . PIH (pregnancy induced hypertension), third trimester 03/04/2018  . PONV (postoperative nausea and vomiting)   . Renal stones t  . S/P cesarean section 03/05/2018  . Seasonal allergies   . Shingles   . Shortness of breath   . Urinary frequency    difficulty emptying bladder completely    Family History  Problem Relation Age of Onset  . Coronary artery disease Father   . Diabetes Father   . Heart disease Father   . Diabetes Mother   . Emphysema Mother   . Depression Mother   . Anxiety disorder Mother   . Bipolar disorder Mother   . OCD Son   . ODD Son   . Bipolar disorder Son   . ADD / ADHD Son   . Hypertension Son   . Diabetes  Son   . ADD / ADHD Son   . Schizophrenia Son   . Other Son        hypopituitarism  . Migraines Neg Hx    Past Surgical History:  Procedure Laterality Date  . CESAREAN SECTION N/A 03/05/2018   Procedure: CESAREAN SECTION;  Surgeon: Janyth Contes, MD;  Location: [facility];  Service: Obstetrics;  Laterality: N/A;  . CHOLECYSTECTOMY    . CYSTOSCOPY WITH STENT PLACEMENT Left 09/29/2015   Procedure: CYSTOSCOPY WITH STENT PLACEMENT;  Surgeon: Raynelle Bring, MD;  Location: [facility];  Service: Urology;  Laterality: Left;  Marland Kitchen MULTIPLE EXTRACTIONS WITH ALVEOLOPLASTY Bilateral 11/30/2012   Procedure: MULTIPLE EXTRACTION ;  Surgeon: Gae Bon, DDS;  Location: [facility];  Service: Oral Surgery;  Laterality: Bilateral;  . WISDOM TOOTH EXTRACTION     Social History   Social History Narrative   Lives w/ fiance and 2 children   Caffeine use: 1-2 glasses per day (tea)   Immunization History  Administered Date(s) Administered  . Td 11/04/2006  . Tdap 08/06/2007     Objective: Vital Signs: BP (!) 149/76 (BP Location: Right Arm, Patient Position: Sitting, Cuff Size: Normal)   Pulse 76   Ht 5\' 2"  (1.575 m)   Wt 293  lb 6.4 oz (133.1 kg)   BMI 53.66 kg/m    Physical Exam Constitutional:      Appearance: She is obese.  HENT:     Right Ear: External ear normal.     Left Ear: External ear normal.     Mouth/Throat:     Mouth: Mucous membranes are moist.     Pharynx: Oropharynx is clear.  Eyes:     Conjunctiva/sclera: Conjunctivae normal.  Cardiovascular:     Rate and Rhythm: Normal rate and regular rhythm.  Pulmonary:     Effort: Pulmonary effort is normal.     Breath sounds: Normal breath sounds.  Skin:    General: Skin is warm and dry.     Findings: No rash.  Neurological:     General: No focal deficit present.     Mental Status: She is alert.  Psychiatric:        Mood and Affect: Mood normal.     Musculoskeletal Exam:  Neck full ROM no tenderness Shoulders full ROM  no tenderness or swelling Elbows full ROM no tenderness or swelling Wrists full ROM no swelling, tenderness to palpation at base of thumb and 1st dorsal compartment L>R, positive tinel's and phalen's tests, normal grip strength and ROM, fingers full ROM no tenderness or swelling Paraspinal muscle tenderness to palpation b/l Lateral and posterior hip tenderness and tightness Knees full ROM bilateral medial joint line tenderness without swelling Ankles full ROM no tenderness or swelling   Investigation: No additional findings.  Imaging: No results found.  Recent Labs: Lab Results  Component Value Date   WBC 17.1 (H) 03/07/2018   HGB 9.6 (L) 03/07/2018   PLT 290 03/07/2018   NA 133 (L) 03/05/2018   K 4.2 03/05/2018   CL 102 03/05/2018   CO2 21 (L) 03/05/2018   GLUCOSE 117 (H) 03/05/2018   BUN 12 03/05/2018   CREATININE 0.77 03/05/2018   BILITOT 0.3 03/05/2018   ALKPHOS 193 (H) 03/05/2018   AST 21 03/05/2018   ALT 19 03/05/2018   PROT 7.0 03/05/2018   ALBUMIN 3.0 (L) 03/05/2018   CALCIUM 10.4 (H) 03/05/2018   GFRAA >60 03/05/2018    Speciality Comments: No specialty comments available.  Procedures:  No procedures performed Allergies: Prednisone, Acetaminophen-codeine, Morphine, Penicillins, and Sulfa antibiotics   Assessment / Plan:     Visit Diagnoses: Bilateral carpal tunnel syndrome - Plan: Ambulatory referral to Occupational Therapy  Bilateral CTS symptoms are typical by history and exam today and with personal history of the same. I do not see any associated inflammatory changes or abnormal ROM. Will refer to OT for evaluation of customizing wrist brace or splint for conservative treatment. Discussed can f/u PRN for more treatment such as injection if worsening or failure to improve with this treatment.  Tenosynovitis, de Quervain - Plan: Ambulatory referral to Occupational Therapy  Bilateral symptoms for dequervain's tenosynovitis, more so than ostoarthritis.  Should benefit with antinflammatories, wrist brace and provided information rehab of this today as well.  Chronic pain of both knees  Suspect medial compartment OA of both knees contributing to this. Discussed weight loss, quadriceps strengthening exercises at this time as conservative treatment options. No inflammatory change seen so no additional workup recommended at this time.  Orders: Orders Placed This Encounter  Procedures  . Ambulatory referral to Occupational Therapy   No orders of the defined types were placed in this encounter.    Follow-Up Instructions: Return if symptoms worsen or fail to improve.  Collier Salina, MD  Note - This record has been created using [employer].  Chart creation errors have been sought, but may not always  have been located. Such creation errors do not reflect on  the standard of medical care.

## 2020-11-23 ENCOUNTER — Ambulatory Visit: Payer: Medicaid Other | Admitting: Internal Medicine

## 2020-11-23 ENCOUNTER — Encounter: Payer: Self-pay | Admitting: Internal Medicine

## 2020-11-23 ENCOUNTER — Other Ambulatory Visit: Payer: Self-pay

## 2020-11-23 VITALS — BP 149/76 | HR 76 | Ht 62.0 in | Wt 293.4 lb

## 2020-11-23 DIAGNOSIS — G8929 Other chronic pain: Secondary | ICD-10-CM

## 2020-11-23 DIAGNOSIS — G5603 Carpal tunnel syndrome, bilateral upper limbs: Secondary | ICD-10-CM | POA: Diagnosis not present

## 2020-11-23 DIAGNOSIS — M549 Dorsalgia, unspecified: Secondary | ICD-10-CM

## 2020-11-23 DIAGNOSIS — M25561 Pain in right knee: Secondary | ICD-10-CM | POA: Insufficient documentation

## 2020-11-23 DIAGNOSIS — M654 Radial styloid tenosynovitis [de Quervain]: Secondary | ICD-10-CM | POA: Diagnosis not present

## 2020-11-23 DIAGNOSIS — M25562 Pain in left knee: Secondary | ICD-10-CM

## 2020-11-23 DIAGNOSIS — N920 Excessive and frequent menstruation with regular cycle: Secondary | ICD-10-CM | POA: Insufficient documentation

## 2020-11-23 NOTE — Patient Instructions (Addendum)
De Quervain's Tenosynovitis  De Quervain's tenosynovitis is a condition that causes inflammation of the tendon on the thumb side of the wrist. Tendons are cords of tissue that connect bones to muscles. The tendons in the hand pass through a tunnel called a sheath. A slippery layer of tissue (synovium) lets the tendons move smoothly in the sheath. With de Quervain's tenosynovitis, the sheath swells or thickens, causing friction and pain. The condition is also called de Quervain's disease and de Quervain's syndrome. It occurs most often in women who are 47-47 years old. What are the causes? The exact cause of this condition is not known. It may be associated with overuse of the hand and wrist. What increases the risk? You are more likely to develop this condition if you:  Use your hands far more than normal, especially if you repeat certain movements that involve twisting your hand or using a tight grip.  Are pregnant.  Are a middle-aged woman.  Have rheumatoid arthritis.  Have diabetes. What are the signs or symptoms? The main symptom of this condition is pain on the thumb side of the wrist. The pain may get worse when you grasp something or turn your wrist. Other symptoms may include:  Pain that extends up the forearm.  Swelling of your wrist and hand.  Trouble moving the thumb and wrist.  A sensation of snapping in the wrist.  A bump filled with fluid (cyst) in the area of the pain. How is this diagnosed? This condition may be diagnosed based on:  Your symptoms and medical history.  A physical exam. During the exam, your health care provider may do a simple test [facility] test) that involves pulling your thumb and wrist to see if this causes pain. You may also need to have an X-ray or ultrasound. How is this treated? Treatment for this condition may include:  Avoiding any activity that causes pain and swelling.  Taking medicines. Anti-inflammatory medicines and  corticosteroid injections may be used to reduce inflammation and relieve pain.  Wearing a splint.  Having surgery. This may be needed if other treatments do not work. Once the pain and swelling have gone down, you may start:  Physical therapy. This includes exercises to improve movement and strength in your wrist and thumb.  Occupational therapy. This includes adjusting how you move your wrist. Follow these instructions at home: If you have a splint:  Wear the splint as told by your health care provider. Remove it only as told by your health care provider.  Loosen the splint if your fingers tingle, become numb, or turn cold and blue.  Keep the splint clean.  If the splint is not waterproof: ? Do not let it get wet. ? Cover it with a watertight covering when you take a bath or a shower. Managing pain, stiffness, and swelling  Avoid movements and activities that cause pain and swelling in the wrist area.  If directed, put ice on the painful area. This may be helpful after doing activities that involve the sore wrist. To do this: ? Put ice in a plastic bag. ? Place a towel between your skin and the bag. ? Leave the ice on for 20 minutes, 2-3 times a day. ? Remove the ice if your skin turns bright red. This is very important. If you cannot feel pain, heat, or cold, you have a greater risk of damage to the area.  Move your fingers often to reduce stiffness and swelling.  Raise (elevate) the  injured area above the level of your heart while you are sitting or lying down.   General instructions  Return to your normal activities as told by your health care provider. Ask your health care provider what activities are safe for you.  Take over-the-counter and prescription medicines only as told by your health care provider.  Keep all follow-up visits. This is important. Contact a health care provider if:  Your pain medicine does not help.  Your pain gets worse.  You develop new  symptoms. Summary  De Quervain's tenosynovitis is a condition that causes inflammation of the tendon on the thumb side of the wrist.  The condition occurs most often in women who are 47-47 years old.  The exact cause of this condition is not known. It may be associated with overuse of the hand and wrist.  Treatment starts with avoiding activity that causes pain or swelling in the wrist area. Other treatments may include wearing a splint and taking medicine. Sometimes, surgery is needed.    Carpal Tunnel Syndrome  Carpal tunnel syndrome is a condition that causes pain, weakness, and numbness in your hand and arm. Numbness is when you cannot feel an area in your body. The carpal tunnel is a narrow area that is on the palm side of your wrist. Repeated wrist motion or certain diseases may cause swelling in the tunnel. This swelling can pinch the main nerve in the wrist. This nerve is called the median nerve. What are the causes? This condition may be caused by:  Moving your hand and wrist over and over again while doing a task.  Injury to the wrist.  Arthritis.  A sac of fluid (cyst) or abnormal growth (tumor) in the carpal tunnel.  Fluid buildup during pregnancy.  Use of tools that vibrate. Sometimes the cause is not known. What increases the risk? The following factors may make you more likely to have this condition:  Having a job that makes you do these things: ? Move your hand over and over again. ? Work with tools that vibrate, such as drills or sanders.  Being a woman.  Having diabetes, obesity, thyroid problems, or kidney failure. What are the signs or symptoms? Symptoms of this condition include:  A tingling feeling in your fingers.  Tingling or loss of feeling in your hand.  Pain in your entire arm. This pain may get worse when you bend your wrist and elbow for a long time.  Pain in your wrist that goes up your arm to your shoulder.  Pain that goes down into  your palm or fingers.  Weakness in your hands. You may find it hard to grab and hold items. You may feel worse at night. How is this treated? This condition may be treated with:  Lifestyle changes. You will be asked to stop or change the activity that caused your problem.  Doing exercises and activities that make bones, muscles, and tendons stronger (physical therapy).  Learning how to use your hand again (occupational therapy).  Medicines for pain and swelling. You may have injections in your wrist.  A wrist splint or brace.  Surgery. Follow these instructions at home: If you have a splint or brace:  Wear the splint or brace as told by your doctor. Take it off only as told by your doctor.  Loosen the splint if your fingers: ? Tingle. ? Become numb. ? Turn cold and blue.  Keep the splint or brace clean.  If the splint or  brace is not waterproof: ? Do not let it get wet. ? Cover it with a watertight covering when you take a bath or a shower. Managing pain, stiffness, and swelling If told, put ice on the painful area:  If you have a removable splint or brace, remove it as told by your doctor.  Put ice in a plastic bag.  Place a towel between your skin and the bag.  Leave the ice on for 20 minutes, 2-3 times per day. Do not fall asleep with the cold pack on your skin.  Take off the ice if your skin turns bright red. This is very important. If you cannot feel pain, heat, or cold, you have a greater risk of damage to the area. Move your fingers often to reduce stiffness and swelling.   General instructions  Take over-the-counter and prescription medicines only as told by your doctor.  Rest your wrist from any activity that may cause pain. If needed, talk with your boss at work about changes that can help your wrist heal.  Do exercises as told by your doctor, physical therapist, or occupational therapist.  Keep all follow-up visits. Contact a doctor if:  You have  new symptoms.  Medicine does not help your pain.  Your symptoms get worse. Get help right away if:  You have very bad numbness or tingling in your wrist or hand. Summary  Carpal tunnel syndrome is a condition that causes pain in your hand and arm.  It is often caused by repeated wrist motions.  Lifestyle changes and medicines are used to treat this problem. Surgery may help in very bad cases.  Follow your doctor's instructions about wearing a splint, resting your wrist, keeping follow-up visits, and calling for help.    Piriformis Syndrome  Piriformis syndrome is a condition that can cause pain and numbness in your buttocks and down the back of your leg. Piriformis syndrome happens when the small muscle that connects the base of your spine to your hip (piriformis muscle) presses on the nerve that runs down the back of your leg (sciatic nerve). The piriformis muscle helps your hip rotate and helps to bring your leg back and out. It also helps shift your weight to keep you stable while you are walking. The sciatic nerve runs under or through the piriformis muscle. Damage to the piriformis muscle can cause spasms that put pressure on the nerve below. This causes pain and discomfort while sitting and moving. The pain may feel as if it begins in the buttock and spreads (radiates) down your hip and thigh. What are the causes? This condition is caused by pressure on the sciatic nerve from the piriformis muscle. The piriformis muscle can get irritated with overuse, especially if other hip muscles are weak and the piriformis muscle has to do extra work. Piriformis syndrome can also occur after an injury, like a fall onto your buttocks. What increases the risk? You are more likely to develop this condition if you:  Are a woman.  Sit for long periods of time.  Are a cyclist.  Have weak buttocks muscles (gluteal muscles). What are the signs or symptoms? Symptoms of this condition  include:  Pain, tingling, or numbness that starts in the buttock and runs down the back of your leg (sciatica).  Pain in the groin or thigh area. Your symptoms may get worse:  The longer you sit.  When you walk, run, or climb stairs.  When straining to have a bowel movement.  How is this diagnosed? This condition is diagnosed based on your symptoms, medical history, and physical exam.  During the exam, your health care provider may: ? Move your leg into different positions to check for pain. ? Press on the muscles of your hip and buttock to see if that increases your symptoms.  You may also have tests, including: ? Imaging tests such as X-rays, MRI, or ultrasound. ? Electromyogram (EMG). This test measures electrical signals sent by your nerves into the muscles. ? Nerve conduction study. This test measures how well electrical signals pass through your nerves. How is this treated? This condition may be treated by:  Stopping all activities that cause pain or make your condition worse.  Applying ice or using heat therapy.  Taking medicines to reduce pain and swelling.  Taking a muscle relaxer (muscle relaxant) to stop muscle spasms.  Doing range-of-motion and strengthening exercises (physical therapy) as told by your health care provider.  Massaging the area.  Having acupuncture.  Getting an injection of medicine in the piriformis muscle. Your health care provider will choose the medicine based on your condition. He or she may inject: ? An anti-inflammatory medicine (steroid) to reduce swelling. ? A numbing medicine (local anesthetic) to block the pain. ? Botulinum toxin. The toxin blocks nerve impulses to specific muscles to reduce muscle tension. In rare cases, you may need surgery to cut the muscle and release pressure on the nerve if other treatments do not work. Follow these instructions at home: Activity  Do not sit for long periods. Get up and walk around every 20  minutes or as often as told by your health care provider. ? When driving long distances, make sure to take frequent stops to get up and stretch.  Use a cushion when you sit on hard surfaces.  Do exercises as told by your health care provider.  Return to your normal activities as told by your health care provider. Ask your health care provider what activities are safe for you. Managing pain, stiffness, and swelling  If directed, apply heat to the affected area as often as told by your health care provider. Use the heat source that your health care provider recommends, such as a moist heat pack or a heating pad. ? Place a towel between your skin and the heat source. ? Leave the heat on for 20-30 minutes. ? Remove the heat if your skin turns bright red. This is especially important if you are unable to feel pain, heat, or cold. You may have a greater risk of getting burned.  If directed, put ice on the injured area. ? Put ice in a plastic bag. ? Place a towel between your skin and the bag. ? Leave the ice on for 20 minutes, 2-3 times a day.      General instructions  Take over-the-counter and prescription medicines only as told by your health care provider.  Ask your health care provider if the medicine prescribed to you requires you to avoid driving or using heavy machinery.  You may need to take actions to prevent or treat constipation, such as: ? Drink enough fluid to keep your urine pale yellow. ? Take over-the-counter or prescription medicines. ? Eat foods that are high in fiber, such as beans, whole grains, and fresh fruits and vegetables. ? Limit foods that are high in fat and processed sugars, such as fried or sweet foods.  Keep all follow-up visits as told by your health care provider. This is important.  How is this prevented?  Do not sit for longer than 20 minutes at a time. When you sit, choose padded surfaces.  Warm up and stretch before being active.  Cool down and  stretch after being active.  Give your body time to rest between periods of activity.  Make sure to use equipment that fits you.  Maintain physical fitness, including: ? Strength. ? Flexibility. Contact a health care provider if:  Your pain and stiffness continue or get worse.  Your leg or hip becomes weak.  You have changes in your bowel function or bladder function. Summary  Piriformis syndrome is a condition that can cause pain, tingling, and numbness in your buttocks and down the back of your leg.  You may try applying heat or ice to relieve the pain.  Do not sit for long periods. Get up and walk around every 20 minutes or as often as told by your health care provider.    Piriformis Syndrome Rehab Strengthening exercises These exercises build strength and endurance in your hip and thigh muscles. Endurance is the ability to use your muscles for a long time, even after they get tired. Straight leg raises, side-lying This exercise strengthens the muscles that rotate the leg at the hip and move it away from your body (hip abductors). 1. Lie on your side with your left / right leg in the top position. Lie so your head, shoulder, knee, and hip line up. Bend your bottom knee to help you balance. 2. Lift your top leg 4-6 inches (10-15 cm) while keeping your toes pointed straight ahead. 3. Hold this position for __________ seconds. 4. Slowly lower your leg to the starting position. 5. Let your muscles relax completely after each repetition. Repeat __________ times. Complete this exercise __________ times a day.

## 2020-11-29 ENCOUNTER — Encounter: Payer: Self-pay | Admitting: Occupational Therapy

## 2020-11-29 ENCOUNTER — Ambulatory Visit: Payer: Medicaid Other | Attending: Internal Medicine | Admitting: Occupational Therapy

## 2020-11-29 ENCOUNTER — Other Ambulatory Visit: Payer: Self-pay

## 2020-11-29 DIAGNOSIS — M25542 Pain in joints of left hand: Secondary | ICD-10-CM | POA: Diagnosis present

## 2020-11-29 DIAGNOSIS — M6281 Muscle weakness (generalized): Secondary | ICD-10-CM | POA: Insufficient documentation

## 2020-11-29 DIAGNOSIS — M25541 Pain in joints of right hand: Secondary | ICD-10-CM | POA: Insufficient documentation

## 2020-11-29 DIAGNOSIS — R202 Paresthesia of skin: Secondary | ICD-10-CM | POA: Diagnosis present

## 2020-11-29 NOTE — Therapy (Signed)
[place]. [place], [place], [address] Phone: [phone]   Fax:  [phone]  Occupational Therapy Evaluation  Patient Details  Name: Grace Fisher MRN: [id number] Date of Birth: 1976-04-27 Referring Provider (OT): Vernelle Emerald, MD   Encounter Date: 11/29/2020   OT End of Session - 11/29/20 1725    Visit Number 1    Number of Visits 7    Date for OT Re-Evaluation 01/24/21    Authorization Type [employer]    Progress Note Due on Visit 10    OT Start Time 1615    OT Stop Time 1700    OT Time Calculation (min) 45 min    Activity Tolerance Patient tolerated treatment well;Patient limited by pain    Behavior During Therapy [facility] for tasks assessed/performed           Past Medical History:  Diagnosis Date  . Acid reflux   . AMA (advanced maternal age) multigravida 23+   . Anxiety   . Asthma   . Bilateral carpal tunnel syndrome   . Bronchitis 11/2012  . Complication of anesthesia    Had post-op fever (100 - 101) and SOB after anesthesia  . Constipation, chronic   . Depression   . Family history of anesthesia complication    Son also has difficult time breathing after anesthesia  . Gestational diabetes    metformin  . Gestational diabetes mellitus (GDM) affecting fourth pregnancy 03/04/2018  . Hypertension   . Insomnia   . Maternal obesity syndrome, antepartum, third trimester 03/04/2018  . Melanoma ([place])    on back and arms  . Mental disorder   . Mental disorder affecting pregnancy in third trimester 03/04/2018  . Migraines   . Obesity   . Panic attack as reaction to stress   . PIH (pregnancy induced hypertension), third trimester 03/04/2018  . PONV (postoperative nausea and vomiting)   . Renal stones t  . S/P cesarean section 03/05/2018  . Seasonal allergies   . Shingles   . Shortness of breath   . Urinary frequency    difficulty emptying bladder completely    Past Surgical  History:  Procedure Laterality Date  . CESAREAN SECTION N/A 03/05/2018   Procedure: CESAREAN SECTION;  Surgeon: Janyth Contes, MD;  Location: [place];  Service: Obstetrics;  Laterality: N/A;  . CHOLECYSTECTOMY    . CYSTOSCOPY WITH STENT PLACEMENT Left 09/29/2015   Procedure: CYSTOSCOPY WITH STENT PLACEMENT;  Surgeon: Raynelle Bring, MD;  Location[facility];  Service: Urology;  Laterality: Left;  Marland Kitchen MULTIPLE EXTRACTIONS WITH ALVEOLOPLASTY Bilateral 11/30/2012   Procedure: MULTIPLE EXTRACTION ;  Surgeon: Gae Bon, DDS;  Location: [place];  Service: Oral Surgery;  Laterality: Bilateral;  . WISDOM TOOTH EXTRACTION      There were no vitals filed for this visit.   Subjective Assessment - 11/29/20 1615    Subjective  Pt arrived to session reporting bilateral pain along radial side of her thumb, wrist, and forearm, as well as bilateral numbness following median n. pattern that is particularly present in the morning when she wakes up. Pt states "this has happened before" approximately 15 years ago after giving birth to her second son. She received therapy for bilateral carpal tunnel then and received custom orthoses for both hands, but reports no longer having them. Pt's primary complaints related to symptoms involve lifting medium to heavy-weight objects, lifting/carrying her 2 y.o. son, decreased strength, pain, and difficulty  opening various containers.    Pertinent History Hx of bilateral carpal tunnel; overall joint and back pain    Repetition Increases Symptoms    Patient Stated Goals Increase functional use of her hands; "to not have to ask for help to lift things"    Currently in Pain? No/denies             [facility] OT Assessment - 11/29/20 1624      Assessment   Medical Diagnosis Bilateral carpal tunnel, de Quervain's tenosynovitis    Referring Provider (OT) Vernelle Emerald, MD    Hand Dominance Right    Prior Therapy Yes      Precautions   Precautions None       Balance Screen   Has the patient fallen in the past 6 months No      Home  Environment   Family/patient expects to be discharged to: Private residence    Living Arrangements Children   52 y.o. and 2 y.o.   Home Access Stairs    Entrance Stairs-Number of Steps ~7    Home Layout One level      Prior Function   Level of Independence Independent    Vocation On disability   Both children require full-time caregiver due to disability   Leisure crafting and crochet      ADL   Eating/Feeding Other (comment )   Pain w/ cutting tougher foods   Grooming Other (comment)   Pain with opening various containers and gripping grooming implements   Upper Body Dressing Needs assist for fasteners;Increased time   due to pain   Lower Body Dressing Needs assist for fasteners;Increased time   due to pain   ADL comments 3 sm buttons completed in 24 sec; tying drawstring on pants in 5 sec      IADL   Light Housekeeping --   Bilateral pain w/ laundry, washing dishes, wiping counters   Meal Prep --   Pain w/ handling utensils   Community Mobility Drives own vehicle    Medication Management --   Difficulty opening tops and Environmental education officer; OT provided education on various med organizers that can be used to avoid pinch/pull w/ thumb     Written Expression   Handwriting Increased time   Reports pain w/ extended writing and using her phone     Sensation   Light Touch Appears Intact    Stereognosis --   Reports sometimes requiring visual input for object identification     Coordination   Gross Motor Movements are Fluid and Coordinated Yes    Fine Motor Movements are Fluid and Coordinated Yes    9 Hole Peg Test Right;Left    Right 9 Hole Peg Test 28 sec    Left 9 Hole Peg Test 26 sec      ROM / Strength   AROM / PROM / Strength AROM      AROM   Overall AROM  Within functional limits for tasks performed    Overall AROM Comments Decreased mobility w/ bilateral thumb ROM due to pain      Hand Function    Right Hand Grip (lbs) 49#    Right Hand Lateral Pinch 11 lbs    Right Hand 3 Point Pinch 12 lbs    Left Hand Grip (lbs) 48#    Left Hand Lateral Pinch 8 lbs    Left 3 point pinch 14 lbs             OT Education -  11/29/20 1716    Education Details Education provided on role and purpose of OT, as well as potential interventions and goals for therapy. OT also provided education on foam built-up handles and provided tan, red, and blue for pt to use at home, as well as handout on joint protection strategies for hands and wrists.    Person(s) Educated Patient    Methods Other (comment);Explanation;Demonstration;Handout    Comprehension Verbalized understanding;Returned demonstration            OT Short Term Goals - 11/29/20 1743      OT SHORT TERM GOAL #1   Title Pt will be independent with wear and care of orthosis prn    Baseline No orthosis at this time    Time 2    Period Weeks    Status New    Target Date 12/13/20      OT SHORT TERM GOAL #2   Title Pt will independently verbalize at least 3 joint protection or compensatory strategies to prevent pain during IADLs    Baseline Decreased knowledge of joint protection/compensatory strategies    Time 4    Period Weeks    Status New    Target Date 12/27/20             OT Long Term Goals - 11/29/20 1746      OT LONG TERM GOAL #1   Title Pt will be independent with HEP designed for gentle stretching and ROM of bilateral wrists/thumbs    Baseline No HEP administered    Time 8    Period Weeks    Status New    Target Date 01/24/21      OT LONG TERM GOAL #2   Title Pt will be able to lift medium-heavy weight objects w/out pain at least 75% of the time    Baseline Difficulty w/ lifting due to pain    Time 8    Period Weeks    Status New      OT LONG TERM GOAL #3   Title Per self-report, pt will be able to participate in leisure crafting/crochet without pain, using AE prn, at least 50% of the time at home    Baseline  Reports decreased participation in leisure activities due to pain    Time 8    Period Weeks    Status New            Plan - 11/29/20 1727    Clinical Impression Statement Pt is a 45 y.o. female who presents to OP OT due to bilateral pain and numbness in thumbs, wrists, and forearms that has worsened over the past few months. 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 PMHx includes prior hx of bilateral carpal tunnel, migraines, HTN, asthma, and anxiety and depression. Pt will benefit from skilled occupational therapy services to address pain management, altered sensation, FMC, compensatory strategies, use of AE/assistive devices, strength, and implementation of an HEP program to improve participation and decrease symptoms during ADLs and IADLs.    OT Occupational Profile and History Problem Focused Assessment - Including review of records relating to presenting problem    Occupational performance deficits (Please refer to evaluation for details): ADL's;IADL's;Rest and Sleep;Leisure    Body Structure / Function / Physical Skills ADL;UE functional use;Body mechanics;Pain;FMC;Sensation;IADL;Decreased knowledge of use of DME;Dexterity;Strength    Rehab Potential Good    Clinical Decision Making  Several treatment options, min-mod task modification necessary    Comorbidities Affecting Occupational Performance: May have comorbidities impacting occupational performance    Modification or Assistance to Complete Evaluation  Min-Moderate modification of tasks or assist with assess necessary to complete eval    OT Frequency 1x / week    OT Duration 8 weeks   Pt will initially be seen for fabrication of custom orthosis and follow-up; additional visits will be scheduled for ~3 weeks out to allow for rest/immobilization prior to implementation of HEP   OT Treatment/Interventions Self-care/ADL training;Patient/family  education;DME and/or AE instruction;Paraffin;Passive range of motion;Cryotherapy;Splinting;Contrast Bath;Moist Heat;Therapeutic exercise;Manual Therapy;Therapeutic activities    Plan Fabrication of custom orthosis    Consulted and Agree with Plan of Care Patient           Patient will benefit from skilled therapeutic intervention in order to improve the following deficits and impairments:   Body Structure / Function / Physical Skills: ADL,UE functional use,Body mechanics,Pain,FMC,Sensation,IADL,Decreased knowledge of use of DME,Dexterity,Strength       Visit Diagnosis: Pain in joint of left hand  Pain in joint of right hand  Paresthesia of skin  Muscle weakness (generalized)    Problem List Patient Active Problem List   Diagnosis Date Noted  . Menorrhagia 11/23/2020  . Tenosynovitis, de Quervain 11/23/2020  . Bilateral knee pain 11/23/2020  . Recurrent major depressive disorder, in partial remission ([place]) 08/26/2018  . PIH (pregnancy induced hypertension) 03/05/2018  . S/P cesarean section 03/05/2018  . PIH (pregnancy induced hypertension), third trimester 03/04/2018  . Maternal obesity syndrome, antepartum, third trimester 03/04/2018  . Gestational diabetes mellitus (GDM) affecting fourth pregnancy 03/04/2018  . Mental disorder affecting pregnancy in third trimester 03/04/2018  . Abnormal glucose tolerance test (GTT) during pregnancy, antepartum 01/20/2018  . Asthma 08/13/2017  . Chronic migraine without aura, with intractable migraine, so stated, with status migrainosus 02/08/2016  . Kidney stone 08/26/2012  . Eczema 09/10/2011  . Insomnia, unspecified 12/07/2010  . Vitamin D deficiency 08/17/2010  . Essential hypertension 08/16/2010  . SINUSITIS, ACUTE 07/06/2009  . HEMATURIA UNSPECIFIED 05/08/2009  . ALLERGIC RHINITIS 01/30/2009  . HEMANGIOMA 01/05/2009  . ASTHMA 01/05/2009  . PNEUMONIA 12/23/2008  . Backache 10/06/2008  . MORBID OBESITY 02/19/2008  .  OBSESSIVE-COMPULSIVE DISORDERS 02/19/2008  . MIGRAINE HEADACHE 02/19/2008  . GERD 02/19/2008  . CHOLELITHIASIS 02/19/2008  . OVARIAN CYST 02/19/2008  . CHOLELITHIASIS, HX OF 02/19/2008  . BIPOLAR DISORDER UNSPECIFIED 08/05/2005  . DIABETES MELLITUS, GESTATIONAL, HX OF 01/04/2005  . CARPAL TUNNEL SYNDROME, BILATERAL 08/06/2003     Kathrine Cords, OTR/L, MSOT 11/29/2020, 6:01 PM  [place]. [place], [place], [address] Phone: [phone]   Fax:  [phone]  Name: Grace Fisher MRN: [id number] Date of Birth: 1975-11-27

## 2020-11-30 ENCOUNTER — Ambulatory Visit: Payer: Medicaid Other | Admitting: Occupational Therapy

## 2020-11-30 DIAGNOSIS — M25542 Pain in joints of left hand: Secondary | ICD-10-CM

## 2020-11-30 DIAGNOSIS — M6281 Muscle weakness (generalized): Secondary | ICD-10-CM

## 2020-11-30 DIAGNOSIS — M25541 Pain in joints of right hand: Secondary | ICD-10-CM

## 2020-11-30 DIAGNOSIS — R202 Paresthesia of skin: Secondary | ICD-10-CM

## 2020-12-01 NOTE — Therapy (Addendum)
[place]. [place], [place], [address] Phone: [phone]   Fax:  [phone]  Occupational Therapy Treatment  Patient Details  Name: Grace Fisher MRN: [id number] Date of Birth: August 25, 1975 Referring Provider (OT): Vernelle Emerald, MD   Encounter Date: 11/30/2020   OT End of Session - 11/30/20 1700     Visit Number 2    Number of Visits 7    Date for OT Re-Evaluation 01/24/21    Authorization Type [place] Medicaid [employer]    Progress Note Due on Visit 10    OT Start Time 1530    OT Stop Time 1640    OT Time Calculation (min) 70 min    Activity Tolerance Patient tolerated treatment well    Behavior During Therapy [facility] for tasks assessed/performed            Past Medical History:  Diagnosis Date   Acid reflux    AMA (advanced maternal age) multigravida 35+    Anxiety    Asthma    Bilateral carpal tunnel syndrome    Bronchitis 12/3974   Complication of anesthesia    Had post-op fever (100 - 101) and SOB after anesthesia   Constipation, chronic    Depression    Family history of anesthesia complication    Son also has difficult time breathing after anesthesia   Gestational diabetes    metformin   Gestational diabetes mellitus (GDM) affecting fourth pregnancy 03/04/2018   Hypertension    Insomnia    Maternal obesity syndrome, antepartum, third trimester 03/04/2018   Melanoma ([place])    on back and arms   Mental disorder    Mental disorder affecting pregnancy in third trimester 03/04/2018   Migraines    Obesity    Panic attack as reaction to stress    PIH (pregnancy induced hypertension), third trimester 03/04/2018   PONV (postoperative nausea and vomiting)    Renal stones t   S/P cesarean section 03/05/2018   Seasonal allergies    Shingles    Shortness of breath    Urinary frequency    difficulty emptying bladder completely    Past Surgical History:  Procedure Laterality Date   CESAREAN  SECTION N/A 03/05/2018   Procedure: CESAREAN SECTION;  Surgeon: Janyth Contes, MD;  Location: [facility];  Service: Obstetrics;  Laterality: N/A;   CHOLECYSTECTOMY     CYSTOSCOPY WITH STENT PLACEMENT Left 09/29/2015   Procedure: CYSTOSCOPY WITH STENT PLACEMENT;  Surgeon: Raynelle Bring, MD;  [facility];  Service: Urology;  Laterality: Left;   MULTIPLE EXTRACTIONS WITH ALVEOLOPLASTY Bilateral 11/30/2012   Procedure: MULTIPLE EXTRACTION ;  Surgeon: Gae Bon, DDS;  Location: [facility];  Service: Oral Surgery;  Laterality: Bilateral;   WISDOM TOOTH EXTRACTION      There were no vitals filed for this visit.   Subjective Assessment - 11/30/20 1700     Subjective  "This looks just like what I had before"    Pertinent History Hx of bilateral carpal tunnel; overall joint and back pain    Repetition Increases Symptoms    Patient Stated Goals Increase functional use of her hands; "to not have to ask for help to lift things"    Currently in Pain? Yes    Pain Score 3     Pain Location Wrist    Pain Orientation Right;Left    Pain Type Acute pain    Pain Onset More than a month ago  Pain Frequency Intermittent             OT Treatments/Exercises (OP) - 11/30/20 1600       Splinting   Splinting Fabricated and fitted R volar forearm-based thumb immobilization orthosis using 1/8" perforated material. Pt positioned in approx 10 degrees of flexion and thumb midway between palmar and radial abduction w/ IP joint free; pt able to oppose thumb to index finger. Pt instructed to wear orthosis as frequently as tolerated, educated on further wear and care of orthosis, and cautioned to closely monitor skin for pressure areas; pt verbalized understanding and handout was provided to pt at conclusion of session.             OT Education - 11/30/20 1700     Education Details Education provided on purpose of orthosis, wear and care, as well as potential signs and symptoms or  irritation of inadequate fit to be aware; handout administered to pt. OT also demonstrated soft tissue massage and instructed pt on benefit of completing massage along radial side of forearm, wrist, and thumb ~2-3x/day    Person(s) Educated Patient    Methods Explanation;Handout;Demonstration    Comprehension Verbalized understanding;Returned demonstration             OT Short Term Goals - 11/30/20 1700       OT SHORT TERM GOAL #1   Title Pt will be independent with wear and care of orthosis prn    Baseline No orthosis at this time    Time 2    Period Weeks    Status On-going    Target Date 12/13/20      OT SHORT TERM GOAL #2   Title Pt will independently verbalize at least 3 joint protection or compensatory strategies to prevent pain during IADLs    Baseline Decreased knowledge of joint protection/compensatory strategies    Time 4    Period Weeks    Status On-going    Target Date 12/27/20             OT Long Term Goals - 11/30/20 1700       OT LONG TERM GOAL #1   Title Pt will be independent with HEP designed for gentle stretching and ROM of bilateral wrists/thumbs    Baseline No HEP administered    Time 8    Period Weeks    Status On-going      OT LONG TERM GOAL #2   Title Pt will be able to lift medium-heavy weight objects w/out pain at least 75% of the time    Baseline Difficulty w/ lifting due to pain    Time 8    Period Weeks    Status On-going      OT LONG TERM GOAL #3   Title Per self-report, pt will be able to participate in leisure crafting/crochet without pain, using AE prn, at least 50% of the time at home    Baseline Reports decreased participation in leisure activities due to pain    Time 8    Period Weeks    Status On-going             Plan - 11/30/20 1645     Clinical Impression Statement Pt arrived to OT session today for fabrication of custom forearm-based orthosis to decrease wrist and thumb movement and help alleviate symptoms of  carpal tunnel and de Quervain's tenosynovitis. OT fabricated orthosis for RUE  due to pt report that symptoms are more frequent on R side  compared w/ L side. Fabrication of LUE custom orthosis initiated after completion of RUE orthosis but was not administered to pt this session; pre-fabricated orthosis for LUE given to pt for use at home. Pt instructed to wear administered orthosis prn and complete gentle soft tissue massage 2-3 times a day to promote healing.    OT Occupational Profile and History Problem Focused Assessment - Including review of records relating to presenting problem    Occupational performance deficits (Please refer to evaluation for details): ADL's;IADL's;Rest and Sleep;Leisure    Body Structure / Function / Physical Skills ADL;UE functional use;Body mechanics;Pain;FMC;Sensation;IADL;Decreased knowledge of use of DME;Dexterity;Strength    Rehab Potential Good    Clinical Decision Making Several treatment options, min-mod task modification necessary    Comorbidities Affecting Occupational Performance: May have comorbidities impacting occupational performance    Modification or Assistance to Complete Evaluation  Min-Moderate modification of tasks or assist with assess necessary to complete eval    OT Frequency 1x / week    OT Duration 8 weeks   Pt will initially be seen for fabrication of custom orthosis and follow-up; additional visits will be scheduled for ~3 weeks out to allow for rest/immobilization prior to implementation of HEP   OT Treatment/Interventions Self-care/ADL training;Patient/family education;DME and/or AE instruction;Paraffin;Passive range of motion;Cryotherapy;Splinting;Contrast Bath;Moist Heat;Therapeutic exercise;Manual Therapy;Therapeutic activities    Plan Fabrication of custom orthosis; update HEP    Consulted and Agree with Plan of Care Patient            Patient will benefit from skilled therapeutic intervention in order to improve the following  deficits and impairments:   Body Structure / Function / Physical Skills: ADL,UE functional use,Body mechanics,Pain,FMC,Sensation,IADL,Decreased knowledge of use of DME,Dexterity,Strength   Visit Diagnosis: Pain in joint of left hand  Pain in joint of right hand  Paresthesia of skin  Muscle weakness (generalized)   Problem List Patient Active Problem List   Diagnosis Date Noted   Menorrhagia 11/23/2020   Tenosynovitis, de Quervain 11/23/2020   Bilateral knee pain 11/23/2020   Recurrent major depressive disorder, in partial remission ([place]) 08/26/2018   PIH (pregnancy induced hypertension) 03/05/2018   S/P cesarean section 03/05/2018   PIH (pregnancy induced hypertension), third trimester 03/04/2018   Maternal obesity syndrome, antepartum, third trimester 03/04/2018   Gestational diabetes mellitus (GDM) affecting fourth pregnancy 03/04/2018   Mental disorder affecting pregnancy in third trimester 03/04/2018   Abnormal glucose tolerance test (GTT) during pregnancy, antepartum 01/20/2018   Asthma 08/13/2017   Chronic migraine without aura, with intractable migraine, so stated, with status migrainosus 02/08/2016   Kidney stone 08/26/2012   Eczema 09/10/2011   Insomnia, unspecified 12/07/2010   Vitamin D deficiency 08/17/2010   Essential hypertension 08/16/2010   SINUSITIS, ACUTE 07/06/2009   HEMATURIA UNSPECIFIED 05/08/2009   ALLERGIC RHINITIS 01/30/2009   HEMANGIOMA 01/05/2009   ASTHMA 01/05/2009   PNEUMONIA 12/23/2008   Backache 10/06/2008   MORBID OBESITY 02/19/2008   OBSESSIVE-COMPULSIVE DISORDERS 02/19/2008   MIGRAINE HEADACHE 02/19/2008   GERD 02/19/2008   CHOLELITHIASIS 02/19/2008   OVARIAN CYST 02/19/2008   CHOLELITHIASIS, HX OF 02/19/2008   BIPOLAR DISORDER UNSPECIFIED 08/05/2005   DIABETES MELLITUS, GESTATIONAL, HX OF 01/04/2005   CARPAL TUNNEL SYNDROME, BILATERAL 08/06/2003    Kathrine Cords, OTR/L, MSOT 11/30/2020, 5:00 PM  OCCUPATIONAL THERAPY DISCHARGE  SUMMARY  Visits from Start of Care: 2  Current functional level related to goals / functional outcomes: Unable to comment/assess as pt cancelled last scheduled appt on 12/07/20 and did not return to  occupational therapy. Pt had not achieved goals at time of last visit.   Remaining deficits: As of last visit on 11/30/20 - pain; altered sensation; decreased strength   Education / Equipment: Joint protection strategies; wear and care of custom-fabricated orthosis; pain management; compensatory strategies. Pt verbalized understanding/returned demonstration.    Pt goals were not met as they were unable to be assessed due to unexpected d/c. Patient is being discharged due to not returning since the last visit.  Kathrine Cords, OTR/L, MSOT 07/17/2021, 10:12 AM  [place]. [place], [place], [address] Phone: [phone]   Fax:  [phone]  Name: Grace Fisher MRN: [id number] Date of Birth: 1976-03-22

## 2020-12-07 ENCOUNTER — Ambulatory Visit: Payer: Medicaid Other | Admitting: Occupational Therapy

## 2021-10-07 ENCOUNTER — Emergency Department (HOSPITAL_BASED_OUTPATIENT_CLINIC_OR_DEPARTMENT_OTHER)
Admission: EM | Admit: 2021-10-07 | Discharge: 2021-10-07 | Disposition: A | Discharge status: Home / Self Care | Payer: Medicaid Other | Attending: Emergency Medicine | Admitting: Emergency Medicine

## 2021-10-07 ENCOUNTER — Other Ambulatory Visit: Payer: Self-pay

## 2021-10-07 ENCOUNTER — Encounter (HOSPITAL_BASED_OUTPATIENT_CLINIC_OR_DEPARTMENT_OTHER): Payer: Self-pay | Admitting: Emergency Medicine

## 2021-10-07 DIAGNOSIS — K0889 Other specified disorders of teeth and supporting structures: Secondary | ICD-10-CM | POA: Diagnosis present

## 2021-10-07 DIAGNOSIS — Z79899 Other long term (current) drug therapy: Secondary | ICD-10-CM | POA: Insufficient documentation

## 2021-10-07 DIAGNOSIS — K0381 Cracked tooth: Secondary | ICD-10-CM | POA: Diagnosis not present

## 2021-10-07 DIAGNOSIS — I10 Essential (primary) hypertension: Secondary | ICD-10-CM | POA: Insufficient documentation

## 2021-10-07 MED ORDER — AMOXICILLIN-POT CLAVULANATE 875-125 MG PO TABS: 1.0000 | ORAL_TABLET | Freq: Two times a day (BID) | ORAL | 0 refills | Status: AC

## 2021-10-07 MED ORDER — OXYCODONE-ACETAMINOPHEN 5-325 MG PO TABS: 1.0000 | ORAL_TABLET | Freq: Four times a day (QID) | ORAL | 0 refills | Status: AC | PRN

## 2021-10-07 NOTE — ED Notes (Signed)
Pt verbalized understanding to pick up prescriptions at pharmacy listed on d/c instructions.  ?

## 2021-10-07 NOTE — ED Triage Notes (Signed)
Pt arrives pov with c/o right lower dental pain x 2 days, reports "cracked tooth". Endorses ibuprofen 600 mg 1 hr pta.  ?

## 2021-10-07 NOTE — ED Provider Notes (Signed)
?[facility] EMERGENC[facility] DEPARTMENT ?Provider Note ? ? ?CSN: [id number] ?Arrival date & time: 10/07/21  1204 ? ?  ? ?History ?Medical history includes anxiety, depression, hypertension, obesity ?Chief Complaint  ?Patient presents with  ? Dental Pain  ? ? ?Grace Fisher is a 46 y.o. female. ?Patient presents the ED with dental pain.  She says about 2 days ago she was eating something and she cracked her right lower front molar.  She has had pretty bad pain ever since then that radiates up her jaw to her ear.  She has noticed some right-sided facial swelling today which is what led her to come to the emergency department.  She does have an established dentist, however is the weekends that she has not called them yet.  She has had multiple teeth removed in the past.  She has taken Motrin for pain without any relief.  Nuys any fevers or chills. ? ? ?Dental Pain ?Associated symptoms: facial swelling   ? ?  ? ?Home Medications ?Prior to Admission medications   ?Medication Sig Start Date End Date Taking? Authorizing Provider  ?amoxicillin-clavulanate (AUGMENTIN) 875-125 MG tablet Take 1 tablet by mouth every 12 (twelve) hours for 7 days. 10/07/21 10/14/21 Yes Kaisen Ackers, Adora Fridge, PA-C  ?oxyCODONE-acetaminophen (PERCOCET/ROXICET) 5-325 MG tablet Take 1 tablet by mouth every 6 (six) hours as needed for severe pain. 10/07/21  Yes Takoda Siedlecki, Adora Fridge, PA-C  ?acetaminophen (TYLENOL) 500 MG tablet Take 1,000 mg by mouth every 6 (six) hours as needed for headache.    [provider]  ?albuterol (PROVENTIL HFA;VENTOLIN HFA) 108 (90 BASE) MCG/ACT inhaler Inhale 2 puffs into the lungs every 6 (six) hours as needed for shortness of breath.    [provider]  ?ALPRAZolam Duanne Moron) 0.5 MG tablet Take 1 tablet (0.5 mg total) by mouth 2 (two) times daily as needed for anxiety or sleep. 03/09/18   Meisinger, Todd, MD  ?busPIRone (BUSPAR) 10 MG tablet Take 10 mg by mouth 3 (three) times daily.    [provider]   ?clindamycin (CLEOCIN) 300 MG capsule Take 1 capsule (300 mg total) by mouth 3 (three) times daily. ?Patient not taking: Reported on 11/23/2020 12/06/19   Jaynee Eagles, PA-C  ?cyclobenzaprine (FLEXERIL) 10 MG tablet take 1 TABLET BY MOUTH EVERY 8 HOURS AS NEEDED for headache pain 11/04/17   [provider]  ?DULoxetine (CYMBALTA) 30 MG capsule Take 60 mg by mouth daily. 10/09/20   [provider]  ?famotidine (PEPCID) 20 MG tablet Take 1 tablet by mouth 2 (two) times daily. 07/16/20   [provider]  ?ibuprofen (ADVIL,MOTRIN) 800 MG tablet Take 1 tablet (800 mg total) by mouth every 8 (eight) hours. 03/09/18   Meisinger, Sherren Mocha, MD  ?labetalol (NORMODYNE) 200 MG tablet Take 1 tablet (200 mg total) by mouth 2 (two) times daily. ?Patient not taking: Reported on 11/23/2020 03/09/18   Meisinger, Sherren Mocha, MD  ?loratadine (CLARITIN) 10 MG tablet Take 10 mg by mouth daily.    [provider]  ?montelukast (SINGULAIR) 10 MG tablet Take 1 tablet by mouth daily. 10/09/20   [provider]  ?norethindrone (MICRONOR) 0.35 MG tablet Take 1 tablet by mouth daily. 11/02/20   [provider]  ?ondansetron (ZOFRAN-ODT) 8 MG disintegrating tablet ondansetron 8 mg disintegrating tablet ? TAKE ONE TABLET (8 MG DOSE) BY MOUTH EVERY 8 (EIGHT) HOURS AS NEEDED FOR NAUSEA FOR UP TO 7 DAYS.    [provider]  ?oxyCODONE (OXY IR/ROXICODONE) 5 MG immediate  release tablet Take 1 tablet (5 mg total) by mouth every 4 (four) hours as needed for severe pain. ?Patient not taking: Reported on 11/23/2020 03/09/18   Meisinger, Sherren Mocha, MD  ?Prenatal Vit-Fe Fumarate-FA (PRENATAL MULTIVITAMIN) TABS tablet Take 2 tablets by mouth daily at 12 noon. ?Patient not taking: Reported on 11/23/2020    [provider]  ?ranitidine (ZANTAC) 150 MG tablet Take 150 mg by mouth at bedtime.    [provider]  ?sertraline (ZOLOFT) 50 MG tablet Take 1 tablet by mouth daily. ?Patient not taking: Reported on  11/23/2020 11/24/17   [provider]  ?zolpidem (AMBIEN) 5 MG tablet Take 1 tablet (5 mg total) by mouth at bedtime as needed for sleep. 03/09/18   Meisinger, Sherren Mocha, MD  ?   ? ?Allergies    ?Prednisone, Acetaminophen-codeine, Morphine, Penicillins, and Sulfa antibiotics   ? ?Review of Systems   ?Review of Systems  ?HENT:  Positive for dental problem and facial swelling.   ?All other systems reviewed and are negative. ? ?Physical Exam ?Updated Vital Signs ?BP (!) 161/91   Pulse 79   Temp 98.3 ?F (36.8 ?C) (Oral)   Resp 18   Ht '5\' 2"'$  (1.575 m)   Wt 128.4 kg   LMP 09/30/2021   SpO2 94%   BMI 51.76 kg/m?  ?Physical Exam ?Vitals and nursing note reviewed.  ?Constitutional:   ?   General: She is not in acute distress. ?   Appearance: Normal appearance. She is well-developed. She is not ill-appearing, toxic-appearing or diaphoretic.  ?HENT:  ?   Head: Normocephalic and atraumatic.  ?   Nose: No nasal deformity.  ?   Mouth/Throat:  ?   Lips: Pink. No lesions.  ? ?   Comments: Patient has multiple missing teeth.  The teeth mark #1 above, is the affected tooth.  There is a crack in the posterior side.  I am able to visualize pulp and dentin.  It is tender to the touch.  No obvious peritonsillar abscess noted on exam. ?Eyes:  ?   General: Gaze aligned appropriately. No scleral icterus.    ?   Right eye: No discharge.     ?   Left eye: No discharge.  ?   Conjunctiva/sclera: Conjunctivae normal.  ?   Right eye: Right conjunctiva is not injected. No exudate or hemorrhage. ?   Left eye: Left conjunctiva is not injected. No exudate or hemorrhage. ?Pulmonary:  ?   Effort: Pulmonary effort is normal. No respiratory distress.  ?Skin: ?   General: Skin is warm and dry.  ?Neurological:  ?   Mental Status: She is alert and oriented to person, place, and time.  ?Psychiatric:     ?   Mood and Affect: Mood normal.     ?   Speech: Speech normal.     ?   Behavior: Behavior normal. Behavior is cooperative.  ? ? ?ED Results /  Procedures / Treatments   ?Labs ?(all labs ordered are listed, but only abnormal results are displayed) ?Labs Reviewed - No data to display ? ?EKG ?None ? ?Radiology ?No results found. ? ?Procedures ?Procedures  ? ?Medications Ordered in ED ?Medications - No data to display ? ?ED Course/ Medical Decision Making/ A&P ?  ?                        ?Medical Decision Making ?Risk ?Prescription drug management. ? ? ? ?MDM  ?This is a 46  y.o. female who presents to the ED with dental pain.  She has a cracked tooth from 2 days ago.  Has associated right-sided swelling.  No other signs of systemic illness.  Plan to treat with antibiotics and pain meds.  She will follow-up with her dentist outpatient. ? ?Charting Requirements ?Additional history is obtained from:  Independent historian ?External Records from outside source obtained and reviewed including: n/a ?Social Determinants of Health:  none ?Pertinant PMH that complicates patient's illness: n/a ? ?Patient Care ?Problems that were addressed during this visit: ?- Dental Pain: Acute illness with complication ?Disposition: pain meds, dental f/u ? ?Portions of this note were generated with [employer]. Dictation errors may occur despite best attempts at proofreading. ?  ? ?  ?  ?  ?  ?  ?  ? ? ? ? ? ? ? ? ? ? ? ?Final Clinical Impression(s) / ED Diagnoses ?Final diagnoses:  ?Pain, dental  ? ? ?Rx / DC Orders ?ED Discharge Orders   ? ?      Ordered  ?  amoxicillin-clavulanate (AUGMENTIN) 875-125 MG tablet  Every 12 hours       ? 10/07/21 1451  ?  oxyCODONE-acetaminophen (PERCOCET/ROXICET) 5-325 MG tablet  Every 6 hours PRN       ? 10/07/21 1453  ? ?  ?  ? ?  ? ? ?  ?Adolphus Birchwood, PA-C ?10/07/21 1906 ? ?  ?Sherwood Gambler, MD ?10/08/21 1111 ? ?

## 2021-10-07 NOTE — Discharge Instructions (Signed)
Prescribed you pain medication for couple of days.  Please do not take this prior to driving.  Also prescribed you antibiotic.  Please take this as prescribed.  You need to call your dentist and have an appointment set up to remove this tooth. ?

## 2021-11-26 ENCOUNTER — Other Ambulatory Visit: Payer: Self-pay

## 2021-11-26 ENCOUNTER — Encounter (HOSPITAL_BASED_OUTPATIENT_CLINIC_OR_DEPARTMENT_OTHER): Payer: Self-pay | Admitting: Emergency Medicine

## 2021-11-26 ENCOUNTER — Emergency Department (HOSPITAL_BASED_OUTPATIENT_CLINIC_OR_DEPARTMENT_OTHER)
Admission: EM | Admit: 2021-11-26 | Discharge: 2021-11-26 | Disposition: A | Discharge status: Home / Self Care | Payer: Medicaid Other | Attending: Emergency Medicine | Admitting: Emergency Medicine

## 2021-11-26 DIAGNOSIS — K029 Dental caries, unspecified: Secondary | ICD-10-CM | POA: Insufficient documentation

## 2021-11-26 DIAGNOSIS — K0889 Other specified disorders of teeth and supporting structures: Secondary | ICD-10-CM

## 2021-11-26 MED ORDER — OXYCODONE HCL 5 MG PO TABS: 5.0000 mg | ORAL_TABLET | Freq: Four times a day (QID) | ORAL | 0 refills | Status: AC | PRN

## 2021-11-26 MED ORDER — OXYCODONE-ACETAMINOPHEN 5-325 MG PO TABS: 1.0000 | ORAL_TABLET | Freq: Once | ORAL | Status: DC

## 2021-11-26 MED ORDER — NAPROXEN 500 MG PO TABS: 500.0000 mg | ORAL_TABLET | Freq: Two times a day (BID) | ORAL | 0 refills | Status: AC | PRN

## 2021-11-26 MED ORDER — AMOXICILLIN-POT CLAVULANATE 875-125 MG PO TABS: 1.0000 | ORAL_TABLET | Freq: Two times a day (BID) | ORAL | 0 refills | Status: AC

## 2021-11-26 MED ORDER — KETOROLAC TROMETHAMINE 30 MG/ML IJ SOLN
30.0000 mg | Freq: Once | INTRAMUSCULAR | Status: AC
  Administered 2021-11-26: 30 mg via INTRAMUSCULAR
  Filled 2021-11-26: qty 1

## 2021-11-26 NOTE — ED Triage Notes (Signed)
Pt reports 2-3 days of dental pain on R lower side. Pt states known cracked tooth. Pt states "in between dentists". Reports pain with swallowing, and gum swelling.  ?

## 2021-11-26 NOTE — Discharge Instructions (Addendum)
You must follow-up with a dentist.  Please contact one of the dentists in the resource guide provided or in the discharge instructions.  Recommend having dental appointment sometime in the next few days if possible.  If you are having increasing swelling, redness, fever, or other new concerning symptom, come back to ER for reassessment. ? ?Recommend taking the prescribed antibiotic as well as anti-inflammatory as needed for pain control. ?

## 2021-11-26 NOTE — ED Provider Notes (Signed)
?[facility] EMERGENC[facility] DEPARTMENT ?Provider Note ? ? ?CSN: [id number] ?Arrival date & time: 11/26/21  0413 ? ?  ? ?History ? ?Chief Complaint  ?Patient presents with  ? Dental Pain  ? ? ?Grace Fisher is a 46 y.o. female.  Presenting to the emergency department due to concern for dental pain.  Patient states that she has had a cracked tooth for many years, has known dental caries but has not been to a dentist in a long time.  States that her dental pain has been worse over the last couple days.  Primarily her right lower and right upper teeth region.  Has tried some over-the-counter Motrin without significant relief.  No facial swelling, no redness. ? ?HPI ? ?  ? ?Home Medications ?Prior to Admission medications   ?Medication Sig Start Date End Date Taking? Authorizing Provider  ?acetaminophen (TYLENOL) 500 MG tablet Take 1,000 mg by mouth every 6 (six) hours as needed for headache.    [provider]  ?albuterol (PROVENTIL HFA;VENTOLIN HFA) 108 (90 BASE) MCG/ACT inhaler Inhale 2 puffs into the lungs every 6 (six) hours as needed for shortness of breath.    [provider]  ?ALPRAZolam Duanne Moron) 0.5 MG tablet Take 1 tablet (0.5 mg total) by mouth 2 (two) times daily as needed for anxiety or sleep. 03/09/18   Meisinger, Sherren Mocha, MD  ?amoxicillin-clavulanate (AUGMENTIN) 875-125 MG tablet Take 1 tablet by mouth 2 (two) times daily for 7 days. 11/26/21 12/03/21 Yes Ladarrion Telfair, Ellwood Dense, MD  ?busPIRone (BUSPAR) 10 MG tablet Take 10 mg by mouth 3 (three) times daily.    [provider]  ?clindamycin (CLEOCIN) 300 MG capsule Take 1 capsule (300 mg total) by mouth 3 (three) times daily. ?Patient not taking: Reported on 11/23/2020 12/06/19   Jaynee Eagles, PA-C  ?cyclobenzaprine (FLEXERIL) 10 MG tablet take 1 TABLET BY MOUTH EVERY 8 HOURS AS NEEDED for headache pain 11/04/17   [provider]  ?DULoxetine (CYMBALTA) 30 MG capsule Take 60 mg by mouth daily. 10/09/20   [provider]   ?famotidine (PEPCID) 20 MG tablet Take 1 tablet by mouth 2 (two) times daily. 07/16/20   [provider]  ?ibuprofen (ADVIL,MOTRIN) 800 MG tablet Take 1 tablet (800 mg total) by mouth every 8 (eight) hours. 03/09/18   Meisinger, Sherren Mocha, MD  ?labetalol (NORMODYNE) 200 MG tablet Take 1 tablet (200 mg total) by mouth 2 (two) times daily. ?Patient not taking: Reported on 11/23/2020 03/09/18   Meisinger, Sherren Mocha, MD  ?loratadine (CLARITIN) 10 MG tablet Take 10 mg by mouth daily.    [provider]  ?montelukast (SINGULAIR) 10 MG tablet Take 1 tablet by mouth daily. 10/09/20   [provider]  ?naproxen (NAPROSYN) 500 MG tablet Take 1 tablet (500 mg total) by mouth 2 (two) times daily as needed. 11/26/21  Yes Lucrezia Starch, MD  ?norethindrone (MICRONOR) 0.35 MG tablet Take 1 tablet by mouth daily. 11/02/20   [provider]  ?ondansetron (ZOFRAN-ODT) 8 MG disintegrating tablet ondansetron 8 mg disintegrating tablet ? TAKE ONE TABLET (8 MG DOSE) BY MOUTH EVERY 8 (EIGHT) HOURS AS NEEDED FOR NAUSEA FOR UP TO 7 DAYS.    [provider]  ?oxyCODONE (OXY IR/ROXICODONE) 5 MG immediate release tablet Take 1 tablet (5 mg total) by mouth every 4 (four) hours as needed for severe pain. ?Patient not taking: Reported on 11/23/2020 03/09/18   Meisinger, Sherren Mocha, MD  ?oxyCODONE-acetaminophen (PERCOCET/ROXICET) 5-325 MG tablet Take 1 tablet by mouth every  6 (six) hours as needed for severe pain. 10/07/21   Loeffler, Adora Fridge, PA-C  ?Prenatal Vit-Fe Fumarate-FA (PRENATAL MULTIVITAMIN) TABS tablet Take 2 tablets by mouth daily at 12 noon. ?Patient not taking: Reported on 11/23/2020    [provider]  ?ranitidine (ZANTAC) 150 MG tablet Take 150 mg by mouth at bedtime.    [provider]  ?sertraline (ZOLOFT) 50 MG tablet Take 1 tablet by mouth daily. ?Patient not taking: Reported on 11/23/2020 11/24/17   [provider]  ?zolpidem (AMBIEN) 5 MG tablet Take 1 tablet (5 mg total) by mouth  at bedtime as needed for sleep. 03/09/18   Meisinger, Sherren Mocha, MD  ?   ? ?Allergies    ?Prednisone, Acetaminophen-codeine, Morphine, Penicillins, and Sulfa antibiotics   ? ?Review of Systems   ?Review of Systems  ?Constitutional:  Negative for chills and fever.  ?HENT:  Positive for dental problem. Negative for ear pain and sore throat.   ?Eyes:  Negative for pain and visual disturbance.  ?Respiratory:  Negative for cough and shortness of breath.   ?Cardiovascular:  Negative for chest pain and palpitations.  ?Gastrointestinal:  Negative for abdominal pain and vomiting.  ?Genitourinary:  Negative for dysuria and hematuria.  ?Musculoskeletal:  Negative for arthralgias and back pain.  ?Skin:  Negative for color change and rash.  ?Neurological:  Negative for seizures and syncope.  ?All other systems reviewed and are negative. ? ?Physical Exam ?Updated Vital Signs ?BP (!) 165/89 (BP Location: Right Arm)   Pulse 66   Temp 98.2 ?F (36.8 ?C) (Oral)   Resp (!) 21   Ht '5\' 3"'$  (1.6 m)   Wt 128.4 kg   LMP 11/25/2021   SpO2 98%   BMI 50.13 kg/m?  ?Physical Exam ?Vitals and nursing note reviewed.  ?Constitutional:   ?   General: She is not in acute distress. ?   Appearance: She is well-developed.  ?HENT:  ?   Head: Normocephalic and atraumatic.  ?   Mouth/Throat:  ?   Comments: There are extensive dental caries to the right upper molar region, right lower molar region is status post multiple tooth removal, there is no generalized swelling or erythema, no induration or fluctuance ? ?Patient does not have any visible swelling or redness to her face externally ?Eyes:  ?   Conjunctiva/sclera: Conjunctivae normal.  ?Cardiovascular:  ?   Rate and Rhythm: Normal rate.  ?   Pulses: Normal pulses.  ?Pulmonary:  ?   Effort: Pulmonary effort is normal. No respiratory distress.  ?Abdominal:  ?   Palpations: Abdomen is soft.  ?   Tenderness: There is no abdominal tenderness.  ?Musculoskeletal:     ?   General: No swelling.  ?   Cervical  back: Neck supple.  ?Skin: ?   General: Skin is warm and dry.  ?   Capillary Refill: Capillary refill takes less than 2 seconds.  ?Neurological:  ?   Mental Status: She is alert.  ?Psychiatric:     ?   Mood and Affect: Mood normal.  ? ? ?ED Results / Procedures / Treatments   ?Labs ?(all labs ordered are listed, but only abnormal results are displayed) ?Labs Reviewed - No data to display ? ?EKG ?None ? ?Radiology ?No results found. ? ?Procedures ?Procedures  ? ? ?Medications Ordered in ED ?Medications  ?ketorolac (TORADOL) 30 MG/ML injection 30 mg (has no administration in time range)  ? ? ?ED Course/ Medical Decision Making/ A&P ?  ?                        ?  Medical Decision Making ?Risk ?Prescription drug management. ? ? ?46 year old lady presents to ER for dental pain.  She has extensive dental caries on physical exam, she does not have any evidence for facial cellulitis or abscess around her teeth at this time.  Recommend that she follow-up with a dentist.  Will give Rx for NSAIDs, antibiotics.  Reviewed return precautions and discharged. ? ? ? ? ? ? ? ?Final Clinical Impression(s) / ED Diagnoses ?Final diagnoses:  ?Pain, dental  ? ? ?Rx / DC Orders ?ED Discharge Orders   ? ?      Ordered  ?  naproxen (NAPROSYN) 500 MG tablet  2 times daily PRN       ? 11/26/21 0722  ?  amoxicillin-clavulanate (AUGMENTIN) 875-125 MG tablet  2 times daily       ? 11/26/21 0175  ? ?  ?  ? ?  ? ? ?  ?Lucrezia Starch, MD ?11/26/21 [phone] ? ?

## 2024-03-03 ENCOUNTER — Encounter (HOSPITAL_BASED_OUTPATIENT_CLINIC_OR_DEPARTMENT_OTHER): Payer: Self-pay | Admitting: Nurse Practitioner

## 2024-03-03 ENCOUNTER — Ambulatory Visit (HOSPITAL_BASED_OUTPATIENT_CLINIC_OR_DEPARTMENT_OTHER): Admitting: Nurse Practitioner
